# Patient Record
Sex: Male | Born: 1965 | Race: White | Hispanic: No | Marital: Married | Smoking: Former smoker
Health system: Southern US, Community
[De-identification: ages and names within clinical notes are randomized; demographics above are authoritative.]

## PROBLEM LIST (undated history)

## (undated) DIAGNOSIS — R7303 Prediabetes: Secondary | ICD-10-CM

## (undated) DIAGNOSIS — I509 Heart failure, unspecified: Secondary | ICD-10-CM

## (undated) DIAGNOSIS — I2510 Atherosclerotic heart disease of native coronary artery without angina pectoris: Secondary | ICD-10-CM

## (undated) DIAGNOSIS — L929 Granulomatous disorder of the skin and subcutaneous tissue, unspecified: Secondary | ICD-10-CM

## (undated) DIAGNOSIS — R0602 Shortness of breath: Secondary | ICD-10-CM

## (undated) DIAGNOSIS — I219 Acute myocardial infarction, unspecified: Secondary | ICD-10-CM

## (undated) DIAGNOSIS — E669 Obesity, unspecified: Secondary | ICD-10-CM

## (undated) DIAGNOSIS — I255 Ischemic cardiomyopathy: Secondary | ICD-10-CM

## (undated) HISTORY — DX: Granulomatous disorder of the skin and subcutaneous tissue, unspecified: L92.9

## (undated) HISTORY — DX: Prediabetes: R73.03

## (undated) HISTORY — DX: Atherosclerotic heart disease of native coronary artery without angina pectoris: I25.10

## (undated) HISTORY — PX: LUNG BIOPSY: SHX232

## (undated) HISTORY — PX: CHOLECYSTECTOMY: SHX55

## (undated) HISTORY — DX: Obesity, unspecified: E66.9

## (undated) HISTORY — DX: Acute myocardial infarction, unspecified: I21.9

## (undated) HISTORY — DX: Heart failure, unspecified: I50.9

## (undated) HISTORY — DX: Shortness of breath: R06.02

## (undated) HISTORY — DX: Ischemic cardiomyopathy: I25.5

---

## 1998-06-28 ENCOUNTER — Emergency Department (HOSPITAL_COMMUNITY): Admission: EM | Admit: 1998-06-28 | Discharge: 1998-06-28 | Payer: Self-pay | Admitting: Emergency Medicine

## 1998-08-03 ENCOUNTER — Emergency Department (HOSPITAL_COMMUNITY): Admission: EM | Admit: 1998-08-03 | Discharge: 1998-08-03 | Payer: Self-pay | Admitting: Emergency Medicine

## 2004-02-10 ENCOUNTER — Emergency Department (HOSPITAL_COMMUNITY): Admission: EM | Admit: 2004-02-10 | Discharge: 2004-02-10 | Payer: Self-pay | Admitting: Emergency Medicine

## 2007-03-06 ENCOUNTER — Emergency Department (HOSPITAL_COMMUNITY): Admission: EM | Admit: 2007-03-06 | Discharge: 2007-03-06 | Payer: Self-pay | Admitting: Emergency Medicine

## 2007-03-07 ENCOUNTER — Ambulatory Visit: Payer: Self-pay | Admitting: Cardiology

## 2007-03-08 ENCOUNTER — Ambulatory Visit: Payer: Self-pay

## 2007-03-08 LAB — CONVERTED CEMR LAB
ALT: 38 units/L (ref 0–40)
AST: 28 units/L (ref 0–37)
Alkaline Phosphatase: 103 units/L (ref 39–117)
Bilirubin, Direct: 0.1 mg/dL (ref 0.0–0.3)
Calcium: 9.2 mg/dL (ref 8.4–10.5)
Cholesterol: 200 mg/dL (ref 0–200)
Creatinine, Ser: 1.1 mg/dL (ref 0.4–1.5)
HDL: 25.4 mg/dL — ABNORMAL LOW (ref >39.0)
LDL Cholesterol: 145 mg/dL — ABNORMAL HIGH (ref 0–99)
Potassium: 3.9 meq/L (ref 3.5–5.1)
Sodium: 136 meq/L (ref 135–145)
Total CHOL/HDL Ratio: 7.9
VLDL: 30 mg/dL (ref 0–40)

## 2010-02-22 ENCOUNTER — Emergency Department: Payer: Self-pay | Admitting: Emergency Medicine

## 2010-02-24 ENCOUNTER — Ambulatory Visit: Payer: Self-pay | Admitting: Internal Medicine

## 2010-02-24 DIAGNOSIS — H6690 Otitis media, unspecified, unspecified ear: Secondary | ICD-10-CM | POA: Insufficient documentation

## 2010-02-24 DIAGNOSIS — M109 Gout, unspecified: Secondary | ICD-10-CM | POA: Insufficient documentation

## 2010-11-17 NOTE — Assessment & Plan Note (Signed)
Summary: nausea,vomiting/alr ok per Alena Bills   Vital Signs:  Patient profile:   45 year old male Height:      68 inches Weight:      263.4 pounds BMI:     40.19 Temp:     97.3 degrees F BP sitting:   130 / 84  Vitals Entered By: Shary Decamp (Feb 24, 2010 10:17 AM) CC: NEW PT ACUTE ONLY, PT HAS APPT TO EST LATER THIS WEEK Comments  - went to ED few days ago, dx with OM (left)  - on percocet, amoxil, antipyrine benzocaine otic  - patient having n/v since starting meds  - still having ear pain Shary Decamp  Feb 24, 2010 10:30 AM    History of Present Illness: new  patient had a cold for two weeks, sore throat, ear ache symptoms were left sided and intense for few days, went to ER in [facility] he was diagnosed with otitis media, prescribed amoxicillin, oxycodone, eardrops ever since he started taking amoxicillin & oxycodone he started to have  nausea and vomiting, no diarrhea.   Patient is not sure which of the two medicines is causing the nausea and vomiting. Ear pain better  Current Medications (verified): 1)  None  Allergies (verified): 1)  ! Morphine  Past History:  Past Medical History: chronic granulomatosis (dx at age 36 @ [facility]) borderline DM Gout  Past Surgical History: Cholecystectomy  Family History: DM-- F side, GF MI-- F CABG age 73   Social History: married-- yes children x 1 tobacco--no ETOH-- socially   Review of Systems General:  denies fevers no ear discharge sore throat has improved denies sinus congestion he did have cough and chest congestion, these  symptoms are improving.  Physical Exam  General:  alert, well-developed, and overweight-appearing.   Head:  face symmetric, not tender to palpation Eyes:  EOMI Ears:  R ear normal.   left TM red  @ upper anterior corner, no bulge, discharge Nose:   not congested Mouth:  slightly red, no discharge, uvula midline Lungs:  normal respiratory effort, no  intercostal retractions, no accessory muscle use, and normal breath sounds.      Impression & Recommendations:  Problem # 1:  LOM (ICD-382.9) patient presents with otitis media, he reports that in the emergency room a few days ago the left ear was quite swollen, today it  doesn't look that bad.  He must be responded to treatment. He has nausea vomiting after taking pain medication and  amoxicillin; statistically, most likely the nausea  is due to the pain medication Plan:  Continue amoxicillin and eardrops pain management with Tylenol and  toradol  stop oxycodone    His updated medication list for this problem includes:    Ketorolac Tromethamine 10 Mg Tabs (Ketorolac tromethamine) ..... One by mouth every 6 hours as needed for pain, do not use for more than 5 days  Problem # 2:  does not have a PCP for regular checkups, see instructions  Problem # 3:  ? of HYPERGLYCEMIA (ICD-790.29) in the past there was a question of hyperglycemia, CBC today normal Orders: Fingerstick (36416) Glucose, (CBG) (19147)  Complete Medication List: 1)  Ketorolac Tromethamine 10 Mg Tabs (Ketorolac tromethamine) .... One by mouth every 6 hours as needed for pain, do not use for more than 5 days  Patient Instructions: 1)  Continue amoxicillin and eardrops 2)  take  Tylenol  for pain 3)  if the pain is severe, take Toradol  4)  stop oxycodone 5)  if you continue with nausea, let us know 6)  also recommend you  to schedule a general checkup at your convenience Prescriptions: KETOROLAC TROMETHAMINE 10 MG TABS (KETOROLAC TROMETHAMINE) one by mouth every 6 hours as needed for pain, do not use for more than 5 days  #12 x 0   Entered and Authorized by:   Brian Rod. Jassiah Viviano MD   Signed by:   Brian Rod. Fatoumata Albaugh MD on 02/24/2010   Method used:   Print then Give to Patient   RxID:   [id number]    Family History:    DM-- F side, GF    MI-- F CABG age 30   Social History:    married-- yes    children x 1     tobacco--no    ETOH-- socially   Laboratory Results   Blood Tests     Glucose (random): 88 mg/dL   (Normal Range: 14-782)

## 2011-03-02 NOTE — Assessment & Plan Note (Signed)
[facility]                            CARDIOLOGY OFFICE NOTE   Brian Gross, Brian Gross                       MRN:          [id number]  DATE:03/07/2007                            DOB:          [birth year]    REFERRING PHYSICIAN:  Joen Laura, MD   REASON FOR CONSULTATION:  Evaluate patient with chest pain.   HISTORY OF PRESENT ILLNESS:  Patient is a pleasant 45 year old gentleman  without prior cardiac history.  He was in the emergency room last night  with chest discomfort.  This actually began the night before that.  It  awoke him from his sleep.  It was left-sided.  He cannot qualify it to  say sharp or dull.  It felt like a pulled muscle.  It was 5/10 in  intensity.  It radiated slightly to his left shoulder.  He felt a little  bit better when he was lying still.  It may have increased when he took  a deep breath.  He took some BC powders and went back to sleep; however,  he awoke in the morning and felt it coming back on and took more BC  powders.  Throughout the day, he noticed this pain coming back.  He  finally presented to the emergency room.  He had no acute EKG changes  and no elevated cardiac markers.  He was given morphine and developed  nausea.  He said the symptoms were not associated with shortness of  breath.  There is no radiation to his jaw.  There was no diaphoresis.  He was not able to bring these on with activity.   He does report nausea.  He oftentimes has to stop what he is doing when  he is working because he gets nauseated.  He leans over and feels like  he is going to vomit, but he does not.  This has been somewhat of a  chronic problem.   He does have some dyspnea doing moderate activity.  This has been slowly  progressive.  He reports a chronic lung disease with previous severe  pneumonias.  His chest x-ray in the ER did suggest some pleural  thickening.   PAST MEDICAL HISTORY:  1. Chronic granulomatosis (diagnosed  at age 65 at [facility]).  2. Borderline diabetes mellitus.  3. Gout.   PAST SURGICAL HISTORY:  Cholecystectomy.   MEDICATIONS:  BC powder, Prilosec.   SOCIAL HISTORY:  He is a Music therapist.  He is married.  He has one 10-year-  old.  He has never smoked cigarettes.  He currently smokes marijuana.  He does not drink alcohol.   FAMILY HISTORY:  Contributory for his father having coronary disease in  his 61s.  He is alive at age 9 and had bypass at age 26.  His mother  had a myocardial infarction with coronary spasm in her 26s.   REVIEW OF SYSTEMS:  As stated in the HPI and positive for headaches,  duodenal ulcer, gastroesophageal reflux disease (symptoms not similar to  his current symptoms).   PHYSICAL EXAMINATION:  GENERAL:  Patient is in no  distress.  VITAL SIGNS:  Weight 233 pounds (Body Mass Index 35).  Blood pressure  112/84, heart rate 77 and regular.  HEENT:  Eyelids unremarkable.  Pupils are equal, round and reactive to  light.  Fundi not visualized.  Oral mucosa unremarkable.  NECK:  No jugular venous distention at 45 degrees.  Carotid upstroke  brisk and symmetric.  No bruits, no thyromegaly.  LYMPHATICS:  No cervical, axillary, or inguinal adenopathy.  LUNGS:  Clear to auscultation bilaterally.  BACK:  No costovertebral angle tenderness.  CHEST:  Unremarkable.  HEART:  PMI not displaced or sustained.  S1 and S2 within normal limits.  No S3, no S4, no clicks, rubs, or murmurs.  ABDOMEN:  Obese, positive bowel sounds.  Normal in frequency and pitch.  No bruits, rebound, guarding.  There are no midline pulsatile masses.  No hepatomegaly, splenomegaly.  SKIN:  No rashes, no nodules.  EXTREMITIES:  Pulses 2+ throughout.  No cyanosis, no clubbing, no edema.  NEUROLOGIC:  Alert and oriented to person, place, and time.  Cranial  nerves II-XII grossly intact.  Motor grossly intact throughout.   EKG (ER), sinus rhythm, rate 52, axis within normal limits, intervals   within normal limits, no acute ST-T wave changes.   ASSESSMENT/PLAN:  1. The patient's chest discomfort has some atypical features.  There      are some worrisome features as well.  He has a very severe family      history.  Given this, his pre-test probability of obstructive      coronary artery disease is somewhat moderate.  He needs screening      with an exercise Cardiolite.  Further evaluation based on these      results.  2. Dyspnea:  I will check a BNP level as well.  If he has a normal      ejection fraction and low BNP, then I would suggest followup with      pulmonologist.  He has had some pleural thickening, and we      discussed this in the office.  He knows he needs followup with      this.  This probably is chronically related to his previous severe      pneumonia and white blood cell disease; however, again, I told him      he needs followup with a pulmonologist, and they will arrange this.  3. Obesity:  He has lost weight intentionally in the past.  He used to      weigh greater than 300 pounds.  I applauded this and encouraged      more of the same.  4. Risk reduction:  We will check a lipid profile when he comes back      for his stress test.  5. Followup:  We will see him back based on the results of the above.     Rollene Rotunda, MD, [facility]  Electronically Signed    JH/MedQ  DD: 03/07/2007  DT: 03/07/2007  Job #: [id number]

## 2011-04-08 ENCOUNTER — Ambulatory Visit (INDEPENDENT_AMBULATORY_CARE_PROVIDER_SITE_OTHER): Payer: Self-pay | Admitting: Internal Medicine

## 2011-04-08 ENCOUNTER — Telehealth: Payer: Self-pay | Admitting: *Deleted

## 2011-04-08 ENCOUNTER — Encounter: Payer: Self-pay | Admitting: Internal Medicine

## 2011-04-08 ENCOUNTER — Ambulatory Visit (INDEPENDENT_AMBULATORY_CARE_PROVIDER_SITE_OTHER)
Admission: RE | Admit: 2011-04-08 | Discharge: 2011-04-08 | Disposition: A | Discharge status: Home / Self Care | Payer: PRIVATE HEALTH INSURANCE | Source: Ambulatory Visit | Attending: Internal Medicine | Admitting: Internal Medicine

## 2011-04-08 VITALS — BP 120/82 | HR 83 | Temp 97.9°F | Wt 285.0 lb

## 2011-04-08 DIAGNOSIS — J40 Bronchitis, not specified as acute or chronic: Secondary | ICD-10-CM

## 2011-04-08 DIAGNOSIS — R062 Wheezing: Secondary | ICD-10-CM

## 2011-04-08 MED ORDER — AZITHROMYCIN 250 MG PO TABS: ORAL_TABLET | ORAL | Status: AC

## 2011-04-08 MED ORDER — ALBUTEROL SULFATE HFA 108 (90 BASE) MCG/ACT IN AERS: 2.0000 | INHALATION_SPRAY | Freq: Four times a day (QID) | RESPIRATORY_TRACT | Status: DC | PRN

## 2011-04-08 NOTE — Telephone Encounter (Signed)
Message copied by Leanne Lovely on Thu Apr 08, 2011  3:13 PM ------      Message from: Brian Gross      Created: Thu Apr 08, 2011  3:03 PM       Advise patient:      No pneumonia, plan is the same

## 2011-04-08 NOTE — Progress Notes (Signed)
  Subjective:    Patient ID: Brian Gross, male    DOB: 06/02/1966, 45 y.o.   MRN: [id number]  HPI Symptoms started to 5 days ago, "chest cold" with cough, wheezing, shortness or breath. Has not taken any medications. Report as a history of granulomatosis and previous pneumonias years ago. This problem has been silent since his 62s.  Past Medical History  Diagnosis Date  . Granulomatosis     chronic granulomatosis (dx at age 46 @ [facility])  . Gout   . Borderline diabetes   . Asthma     Past Surgical History  Procedure Date  . Cholecystectomy     Social History: married-- yes children x 1 tobacco--no ETOH-- socially   Review of Systems  Constitutional: Negative for fever.  Respiratory:       Some yellow sputum in AM, rest of the day clear sputum. + wheezing  Gastrointestinal: Negative for nausea and vomiting.       Objective:   Physical Exam  Constitutional: He appears well-developed and well-nourished. No distress.       Frequent cough noted but no respiratory distress  HENT:  Head: Normocephalic.  Mouth/Throat: No oropharyngeal exudate.  Eyes: No scleral icterus.  Cardiovascular: Normal rate, regular rhythm and normal heart sounds.   No murmur heard. Pulmonary/Chest:       Few rhonchi and wheezing bilaterally. No increased work of breathing. No crackles.  Skin: He is not diaphoretic.          Assessment & Plan:

## 2011-04-08 NOTE — Patient Instructions (Addendum)
Get your XR Rest, fluids , tylenol For cough, take Mucinex DM twice a day as needed  For wheezing: ventolin 2 puffs every 6 hours  Take the antibiotic as prescribed (zpack) Call if no better in few days Call anytime if the symptoms are severe, you have high fever, short of breath, persistent chest pain  Also came back for a physical at your convenience

## 2011-04-08 NOTE — Telephone Encounter (Signed)
Message left for patient to return my call.  

## 2011-04-08 NOTE — Assessment & Plan Note (Addendum)
Sx c/w bronchitis and asthma exacerbation , also has some R sided pleuritic pain: Will get a XR, see instructions Also needs a CPX , pt aware

## 2011-04-09 NOTE — Telephone Encounter (Signed)
Pt aware of information.  

## 2011-10-18 ENCOUNTER — Other Ambulatory Visit: Payer: Self-pay

## 2011-10-18 ENCOUNTER — Inpatient Hospital Stay (HOSPITAL_COMMUNITY)
Admission: AD | Admit: 2011-10-18 | Discharge: 2011-10-21 | DRG: 247 | Disposition: A | Discharge status: Home / Self Care | Payer: PRIVATE HEALTH INSURANCE | Source: Ambulatory Visit | Attending: Interventional Cardiology | Admitting: Interventional Cardiology

## 2011-10-18 ENCOUNTER — Encounter (HOSPITAL_COMMUNITY)
Admission: AD | Disposition: A | Discharge status: Home / Self Care | Payer: Self-pay | Source: Ambulatory Visit | Attending: Interventional Cardiology

## 2011-10-18 ENCOUNTER — Encounter (HOSPITAL_COMMUNITY): Payer: Self-pay | Admitting: *Deleted

## 2011-10-18 DIAGNOSIS — Z79899 Other long term (current) drug therapy: Secondary | ICD-10-CM

## 2011-10-18 DIAGNOSIS — E785 Hyperlipidemia, unspecified: Secondary | ICD-10-CM | POA: Diagnosis present

## 2011-10-18 DIAGNOSIS — F17200 Nicotine dependence, unspecified, uncomplicated: Secondary | ICD-10-CM | POA: Diagnosis present

## 2011-10-18 DIAGNOSIS — M109 Gout, unspecified: Secondary | ICD-10-CM | POA: Diagnosis present

## 2011-10-18 DIAGNOSIS — E8779 Other fluid overload: Secondary | ICD-10-CM | POA: Diagnosis present

## 2011-10-18 DIAGNOSIS — E669 Obesity, unspecified: Secondary | ICD-10-CM

## 2011-10-18 DIAGNOSIS — I2109 ST elevation (STEMI) myocardial infarction involving other coronary artery of anterior wall: Principal | ICD-10-CM

## 2011-10-18 DIAGNOSIS — J45909 Unspecified asthma, uncomplicated: Secondary | ICD-10-CM | POA: Diagnosis present

## 2011-10-18 DIAGNOSIS — Z7982 Long term (current) use of aspirin: Secondary | ICD-10-CM

## 2011-10-18 DIAGNOSIS — I2510 Atherosclerotic heart disease of native coronary artery without angina pectoris: Secondary | ICD-10-CM

## 2011-10-18 DIAGNOSIS — I2582 Chronic total occlusion of coronary artery: Secondary | ICD-10-CM | POA: Diagnosis present

## 2011-10-18 HISTORY — PX: LEFT HEART CATHETERIZATION WITH CORONARY ANGIOGRAM: SHX5451

## 2011-10-18 HISTORY — PX: PERCUTANEOUS CORONARY STENT INTERVENTION (PCI-S): SHX5485

## 2011-10-18 LAB — COMPREHENSIVE METABOLIC PANEL
ALT: 32 U/L (ref 0–53)
AST: 80 U/L — ABNORMAL HIGH (ref 0–37)
Albumin: 3.3 g/dL — ABNORMAL LOW (ref 3.5–5.2)
Alkaline Phosphatase: 102 U/L (ref 39–117)
BUN: 22 mg/dL (ref 6–23)
CO2: 23 mEq/L (ref 19–32)
GFR calc non Af Amer: 87 mL/min — ABNORMAL LOW (ref >90)
Glucose, Bld: 131 mg/dL — ABNORMAL HIGH (ref 70–99)
Sodium: 134 mEq/L — ABNORMAL LOW (ref 135–145)
Total Bilirubin: 0.3 mg/dL (ref 0.3–1.2)

## 2011-10-18 LAB — CARDIAC PANEL(CRET KIN+CKTOT+MB+TROPI)
CK, MB: 98.6 ng/mL (ref 0.3–4.0)
CK, MB: 296.7 ng/mL (ref 0.3–4.0)
Relative Index: 5.7 — ABNORMAL HIGH (ref 0.0–2.5)
Total CK: 1392 U/L — ABNORMAL HIGH (ref 7–232)
Total CK: 4931 U/L — ABNORMAL HIGH (ref 7–232)
Troponin I: >25 ng/mL (ref <0.30)

## 2011-10-18 LAB — CBC
HCT: 43.1 % (ref 39.0–52.0)
Hemoglobin: 14.9 g/dL (ref 13.0–17.0)
MCH: 29.9 pg (ref 26.0–34.0)
RDW: 13.1 % (ref 11.5–15.5)
WBC: 13.2 10*3/uL — ABNORMAL HIGH (ref 4.0–10.5)

## 2011-10-18 LAB — MRSA PCR SCREENING: MRSA by PCR: NEGATIVE

## 2011-10-18 LAB — LIPID PANEL
Cholesterol: 222 mg/dL — ABNORMAL HIGH (ref 0–200)
LDL Cholesterol: 143 mg/dL — ABNORMAL HIGH (ref 0–99)
Triglycerides: 156 mg/dL — ABNORMAL HIGH (ref <150)

## 2011-10-18 LAB — GLUCOSE, CAPILLARY: Glucose-Capillary: 124 mg/dL — ABNORMAL HIGH (ref 70–99)

## 2011-10-18 LAB — POCT I-STAT, CHEM 8
Chloride: 104 mEq/L (ref 96–112)
Glucose, Bld: 136 mg/dL — ABNORMAL HIGH (ref 70–99)
HCT: 47 % (ref 39.0–52.0)
Potassium: 4.3 mEq/L (ref 3.5–5.1)

## 2011-10-18 LAB — PROTIME-INR
INR: 1.05 (ref 0.00–1.49)
Prothrombin Time: 13.9 seconds (ref 11.6–15.2)

## 2011-10-18 LAB — APTT: aPTT: 47 seconds — ABNORMAL HIGH (ref 24–37)

## 2011-10-18 LAB — POCT ACTIVATED CLOTTING TIME: Activated Clotting Time: 452 seconds

## 2011-10-18 SURGERY — LEFT HEART CATHETERIZATION WITH CORONARY ANGIOGRAM
Anesthesia: LOCAL

## 2011-10-18 MED ORDER — TICAGRELOR 90 MG PO TABS
ORAL_TABLET | ORAL | Status: AC
  Filled 2011-10-18: qty 2

## 2011-10-18 MED ORDER — SODIUM CHLORIDE 0.9 % IV SOLN: 1.0000 mL/kg/h | INTRAVENOUS | Status: AC

## 2011-10-18 MED ORDER — FENTANYL CITRATE 0.05 MG/ML IJ SOLN
INTRAMUSCULAR | Status: AC
  Filled 2011-10-18: qty 2

## 2011-10-18 MED ORDER — BIVALIRUDIN 250 MG IV SOLR
INTRAVENOUS | Status: AC
  Filled 2011-10-18: qty 250

## 2011-10-18 MED ORDER — ROSUVASTATIN CALCIUM 40 MG PO TABS
40.0000 mg | ORAL_TABLET | Freq: Every day | ORAL | Status: DC
  Administered 2011-10-18 – 2011-10-20 (×3): 40 mg via ORAL
  Filled 2011-10-18 (×4): qty 1

## 2011-10-18 MED ORDER — MIDAZOLAM HCL 2 MG/2ML IJ SOLN
INTRAMUSCULAR | Status: AC
  Filled 2011-10-18: qty 2

## 2011-10-18 MED ORDER — BIOTENE DRY MOUTH MT LIQD
15.0000 mL | Freq: Two times a day (BID) | OROMUCOSAL | Status: DC
  Administered 2011-10-18 – 2011-10-19 (×2): 15 mL via OROMUCOSAL

## 2011-10-18 MED ORDER — ONDANSETRON HCL 4 MG/2ML IJ SOLN: 4.0000 mg | Freq: Four times a day (QID) | INTRAMUSCULAR | Status: DC | PRN

## 2011-10-18 MED ORDER — NITROGLYCERIN 0.2 MG/ML ON CALL CATH LAB
INTRAVENOUS | Status: AC
  Filled 2011-10-18: qty 1

## 2011-10-18 MED ORDER — LIDOCAINE HCL (PF) 1 % IJ SOLN
INTRAMUSCULAR | Status: AC
  Filled 2011-10-18: qty 30

## 2011-10-18 MED ORDER — LORAZEPAM 2 MG/ML IJ SOLN
1.0000 mg | INTRAMUSCULAR | Status: DC | PRN
  Administered 2011-10-18: 2 mg via INTRAVENOUS
  Administered 2011-10-18: 1 mg via INTRAVENOUS
  Administered 2011-10-19 – 2011-10-20 (×3): 2 mg via INTRAVENOUS
  Filled 2011-10-18 (×5): qty 1

## 2011-10-18 MED ORDER — SODIUM CHLORIDE 0.9 % IV SOLN
INTRAVENOUS | Status: DC
  Administered 2011-10-18: 19:00:00 via INTRAVENOUS
  Administered 2011-10-19: 10 mL/h via INTRAVENOUS

## 2011-10-18 MED ORDER — FENTANYL CITRATE 0.05 MG/ML IJ SOLN
25.0000 ug | INTRAMUSCULAR | Status: DC | PRN
  Administered 2011-10-18 (×3): 50 ug via INTRAVENOUS
  Filled 2011-10-18 (×2): qty 2

## 2011-10-18 MED ORDER — ACETAMINOPHEN 325 MG PO TABS
650.0000 mg | ORAL_TABLET | ORAL | Status: DC | PRN
  Administered 2011-10-19 – 2011-10-21 (×3): 650 mg via ORAL
  Filled 2011-10-18 (×3): qty 2

## 2011-10-18 MED ORDER — TICAGRELOR 90 MG PO TABS
90.0000 mg | ORAL_TABLET | Freq: Two times a day (BID) | ORAL | Status: DC
  Administered 2011-10-18 – 2011-10-21 (×6): 90 mg via ORAL
  Filled 2011-10-18 (×8): qty 1

## 2011-10-18 MED ORDER — FENTANYL CITRATE 0.05 MG/ML IJ SOLN
INTRAMUSCULAR | Status: AC
  Administered 2011-10-18: 50 ug via INTRAVENOUS
  Filled 2011-10-18: qty 2

## 2011-10-18 MED ORDER — HEPARIN (PORCINE) IN NACL 2-0.9 UNIT/ML-% IJ SOLN
INTRAMUSCULAR | Status: AC
  Filled 2011-10-18: qty 1000

## 2011-10-18 MED ORDER — METOPROLOL TARTRATE 12.5 MG HALF TABLET
12.5000 mg | ORAL_TABLET | Freq: Two times a day (BID) | ORAL | Status: DC
  Administered 2011-10-18: 12.5 mg via ORAL
  Filled 2011-10-18 (×2): qty 1

## 2011-10-18 MED ORDER — METOPROLOL TARTRATE 25 MG PO TABS
25.0000 mg | ORAL_TABLET | Freq: Two times a day (BID) | ORAL | Status: DC
  Administered 2011-10-18 – 2011-10-20 (×4): 25 mg via ORAL
  Filled 2011-10-18 (×5): qty 1

## 2011-10-18 MED ORDER — ASPIRIN 81 MG PO CHEW
81.0000 mg | CHEWABLE_TABLET | Freq: Every day | ORAL | Status: DC
  Administered 2011-10-19 – 2011-10-21 (×3): 81 mg via ORAL
  Filled 2011-10-18 (×3): qty 1

## 2011-10-18 NOTE — Progress Notes (Signed)
Chaplain Note:  Chaplain responded immediately to Code STEMI page.  Chaplain met pt's family in ED waiting area, introduced Brian Gross, and guided them to a consult room outside the Cath Lab.  Pt's wife, son, and father were present.  Chaplain offered spiritual comfort and support for pt's family.  Chaplain support was appreciated but no specific needs were expressed.  Chaplain informed Cath Lab staff of family's location.  Chaplain will refer case to chaplain assigned to CICU.    Verdie Shire, chaplain resident [phone]

## 2011-10-18 NOTE — Procedures (Signed)
PROCEDURE:  Left heart catheterization with selective coronary angiography, left ventriculogram.  PCI LAD.  Attempted PCI RCA.  INDICATIONS:    The patient was brought emergently to the cath lab.  The patient verbalized understanding and wanted to proceed.  Emergency, implied consent.  PROCEDURE TECHNIQUE:  After Xylocaine anesthesia a 9F sheath was placed in the right femoral vein with a single anterior needle wall stick.   Due to the depth of the vessel, there were some difficulty in getting a 6 French sheath into the right common femoral artery.  We used fluoroscopy and the landmark of the venous sheath to obtain arterial access. Left coronary angiography was done using a Judkins L4 guide catheter.  Right coronary angiography was done using a Judkins R4 guide catheter.  The PCI was performed using a CLS 3.5 guiding catheter.  Angiomax used for anticoagulation. a JR 4 guiding catheter was placed into the ostium of the right coronary artery.  A pro-water wire was advanced to the stenosis but would not cross.  Left ventriculography was done using a pigtail catheter.  An Angio-Seal was deployed for hemostasis.  The venous sheath was sutured in place.   CONTRAST:  Total of 180 cc.  COMPLICATIONS:  None.    HEMODYNAMICS:  Aortic pressure was 107/85; LV pressure was 105/17; LVEDP 30.  There was no gradient between the left ventricle and aorta.    ANGIOGRAPHIC DATA:   The left main coronary artery is large, widely patent vessel..  The left anterior descending artery is is a large vessel.  The proximal vessel is widely patent.  Just past the first septal perforator there is a complete occlusion.  After intervention, we're able to see that the LAD has other mild luminal regularities but is overall widely patent.  It does wrap around the apex..  The left circumflex artery is a medium-sized vessel.  There is mild atherosclerosis in the mid vessel.  There is a medium-sized OM 1 which also has mild luminal  irregularities.  The OM1 branches across the lateral wall.  There are collaterals from the left circumflex filling the distal right vessel..  The right coronary artery is a medium size dominant vessel.  There is a high bifurcation of the PLA and PDA.  The posterior lateral artery appears occluded in the midportion.  PCI Narrative:  A CLS 3.5 guiding catheters used a pro-water wire was placed across the lesion in the LAD aspiration catheter was used which successfully removed a large burden of thrombus.  A 2.5 x 12 immerge balloon was used to dilate the residual 80% stenosis in the mid LAD.  The area was stented with a 3.0 x 20 promus drug-eluting stent deployed at 18 atmospheres for 30 seconds.  The stent was post on a 3.5 x 15 NCTrak: Inflated up to 18 atmospheres.  There is no residual stenosis.  TIMI-3 flow was restored to the LAD.  Intracoronary nitroglycerin was used successfully to treat vasospasm during the case. The JR 4 guiding catheter was used to his right coronary artery.  We elected to try to intervene upon this vessel because of a hazy appearance of the occlusion.  It was not clear whether this was recent or old.  The pro-water wire was easily advanced to the stenosis but did not cross.  Therefore we stopped as it seemed more likely that this is a chronic lesion.  LEFT VENTRICULOGRAM:  Left ventricular angiogram was done in the 30 RAO projection and revealed severe anterior and  apical hypokinesis with an estimated ejection fraction of 30 %.  LVEDP was 30 mmHg.  IMPRESSIONS:  1. Normal left main coronary artery.  Mild disease in the circumflex system. 2. Occluded LAD which was successfully stented with a drug-eluting stent to the mid vessel.  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 3. 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 Severe left ventricular systolic dysfunction.  LVEDP 30 mmHg.  Ejection  fraction 30 %.  RECOMMENDATION:  The patient will be watched in the ICU.  He'll need aggressive risk factor modification.  He'll need dual antiplatelet therapy for at least a year.  We'll start beta blocker and statin.  He will benefit from weight loss.

## 2011-10-18 NOTE — H&P (Signed)
Admit date: 10/18/2011 Referring Physician Dr. Drue Novel Primary Cardiologist Eldridge Dace Chief complaint/reason for admission: Chest pain  HPI: 45 year old man who has no prior cardiac history.  He woke up this morning about 1 AM with a pressure in his chest.  The pressure continued and he eventually called EMS later in the morning.  Initial ECG, which was transmitted by [facility], showed Q waves throughout the anterior precordium with some minimal ST elevation.  This is suggestive of an old anterior infarct.  I went to meet him in the emergency room and when he arrived, I saw that he was holding his chest.  He continued to report chest pressure.  He did not feel well in general.  There is no sweating, nausea or lightheadedness.  Further history shows that he has borderline diabetes.  He does smoke marijuana occasionally.  He does not report smoking cigarettes.  He does not report any family history of heart disease.  He does not take any medicines at home.  He did take several BC powders prior to arrival for her pain.  He did not report any bleeding problems.  He does not have any elective surgery planned.    PMH:    Past Medical History  Diagnosis Date  . Granulomatosis     chronic granulomatosis (dx at age 89 @ [facility])  . Gout   . Borderline diabetes   . Asthma     PSH:    Past Surgical History  Procedure Date  . Cholecystectomy     ALLERGIES:   Morphine  Prior to Admit Meds:   Prescriptions prior to admission  Medication Sig Dispense Refill  . albuterol (PROVENTIL HFA;VENTOLIN HFA) 108 (90 BASE) MCG/ACT inhaler Inhale 2 puffs into the lungs every 6 (six) hours as needed. For wheezing       . DISCONTD: albuterol (VENTOLIN HFA) 108 (90 BASE) MCG/ACT inhaler Inhale 2 puffs into the lungs every 6 (six) hours as needed for wheezing.  1 Inhaler  1   Family HX:   No family history on file. Social HX:    History   Social History  . Marital Status: Single    Spouse Name: N/A   Number of Children: N/A  . Years of Education: N/A   Occupational History  . Not on file.   Social History Main Topics  . Smoking status: Not on file  . Smokeless tobacco: Not on file  . Alcohol Use: Not on file  . Drug Use: Not on file  . Sexually Active: Not on file   Other Topics Concern  . Not on file   Social History Narrative  . No narrative on file     ROS:  All 11 ROS were addressed and are negative except what is stated in the HPI  PHYSICAL EXAM Filed Vitals:   10/18/11 1025  Pulse: 96   General: Well developed, well nourished, anxious, tearful Head: Eyes PERRLA, No xanthomas.   Normal cephalic and atramatic  Lungs:   Clear bilaterally to auscultation and percussion. Heart:   HRRR S1 S2  Abdomen: Obese Msk:  Back normal, . Normal strength and tone for age. Extremities:   No clubbing, cyanosis or edema.  Difficult to palpate femoral pulse Neuro: Alert and oriented X 3. Psych:  Anxious, tearful   Labs:   Lab Results  Component Value Date   WBC 13.2* 10/18/2011   HGB 14.9 10/18/2011   HCT 43.1 10/18/2011   MCV 86.4 10/18/2011   PLT 238  10/18/2011    Lab 10/18/11 1000  NA 134*  K 4.0  CL 99  CO2 23  BUN 22  CREATININE 1.03  CALCIUM 8.6  PROT 7.4  BILITOT 0.3  ALKPHOS 102  ALT 32  AST 80*  GLUCOSE 131*   Lab Results  Component Value Date   CKTOTAL 1392* 10/18/2011   CKMB 98.6* 10/18/2011   TROPONINI 15.58* 10/18/2011   No results found for this basename: PTT   Lab Results  Component Value Date   INR 1.05 10/18/2011     Lab Results  Component Value Date   CHOL 222* 10/18/2011   CHOL 200 03/08/2007   Lab Results  Component Value Date   HDL 48 10/18/2011   HDL 25.4* 03/08/2007   Lab Results  Component Value Date   LDLCALC 143* 10/18/2011   LDLCALC 145* 03/08/2007   Lab Results  Component Value Date   TRIG 156* 10/18/2011   TRIG 150* 03/08/2007   Lab Results  Component Value Date   CHOLHDL 4.6 10/18/2011   CHOLHDL 7.9 CALC  03/08/2007   No results found for this basename: LDLDIRECT      Radiology:  @RISRSLT24 @  EKG: Normal sinus rhythm, anterior Q waves with minimal ST elevation.  ASSESSMENT: Possible acute anterior ST elevation MI  PLAN:  Although the ECG is not convincing for STEMI, the patient appears to be in continued discomfort.  He certainly has some risk factors for heart disease and I think it would be safer to perform an emergent coronary angiogram on him.  Although he is quite nervous, he is agreeable.  Regardless of the findings, he'll need risk factor modification including stopping smoking, weight loss, blood sugar management.  Further plans will be based on the findings from his catheterization.  Will also check lipids.  He will be admitted to the hospital.  Corky Crafts., MD  10/18/2011  12:53 PM

## 2011-10-18 NOTE — Progress Notes (Signed)
56fr venous sheath aspirated and removed from right femoral vein. Manual pressure applied for 15 minutes. Vss stable BP 101/72 hr 84. No S+S of hematoma. Dressing applied. Distal pulse bilateral dp palpable. Bedrest instructions given.

## 2011-10-19 LAB — BASIC METABOLIC PANEL
BUN: 16 mg/dL (ref 6–23)
Chloride: 99 mEq/L (ref 96–112)
GFR calc Af Amer: >90 mL/min (ref >90)
Potassium: 4.5 mEq/L (ref 3.5–5.1)

## 2011-10-19 LAB — CBC
HCT: 42 % (ref 39.0–52.0)
Hemoglobin: 14 g/dL (ref 13.0–17.0)
RDW: 13.5 % (ref 11.5–15.5)
WBC: 12.7 10*3/uL — ABNORMAL HIGH (ref 4.0–10.5)

## 2011-10-19 LAB — CARDIAC PANEL(CRET KIN+CKTOT+MB+TROPI)
CK, MB: 72.5 ng/mL (ref 0.3–4.0)
Relative Index: 4.5 — ABNORMAL HIGH (ref 0.0–2.5)
Troponin I: >25 ng/mL (ref <0.30)

## 2011-10-19 MED ORDER — FUROSEMIDE 10 MG/ML IJ SOLN
40.0000 mg | Freq: Two times a day (BID) | INTRAMUSCULAR | Status: DC
  Administered 2011-10-19 (×2): 40 mg via INTRAVENOUS
  Filled 2011-10-19 (×4): qty 4

## 2011-10-19 NOTE — Progress Notes (Addendum)
SUBJECTIVE:  Chest pain resolved  OBJECTIVE:   Vitals:   Filed Vitals:   10/19/11 0600 10/19/11 0742 10/19/11 0800 10/19/11 0805  BP:  118/83  118/81  Pulse: 101 104 104 105  Temp:   97.3 F (36.3 C)   TempSrc:   Oral   Resp: 11 13 17 14   Height:      Weight:      SpO2: 98% 97% 97% 97%   I&O's:   Intake/Output Summary (Last 24 hours) at 10/19/11 0926 Last data filed at 10/19/11 0800  Gross per 24 hour  Intake 889.74 ml  Output   1650 ml  Net -760.26 ml   TELEMETRY: Reviewed telemetry pt in NSR, sinus tachycardia:     PHYSICAL EXAM General: Well developed, well nourished, in no acute distress Head: Eyes PERRLA,   Normal cephalic and atramatic  Lungs:  Clear bilaterally to auscultation and percussion. Heart:   HRRR S1 S2 Abdomen: , abdomen soft and non-tender  Msk:  Back normal, normal gait. Normal strength and tone for age. Extremities:  No hematoma in right groin Neuro: Alert and oriented X 3. Psych:  Good affect, responds appropriately   LABS: Basic Metabolic Panel:  Basename 10/19/11 0520 10/18/11 1000  NA 135 134*  K 4.5 4.0  CL 99 99  CO2 25 23  GLUCOSE 92 131*  BUN 16 22  CREATININE 1.11 1.03  CALCIUM 8.8 8.6  MG -- --  PHOS -- --   Liver Function Tests:  Basename 10/18/11 1000  AST 80*  ALT 32  ALKPHOS 102  BILITOT 0.3  PROT 7.4  ALBUMIN 3.3*   No results found for this basename: LIPASE:2,AMYLASE:2 in the last 72 hours CBC:  Basename 10/19/11 0520 10/18/11 1000  WBC 12.7* 13.2*  NEUTROABS -- --  HGB 14.0 14.9  HCT 42.0 43.1  MCV 89.2 86.4  PLT 194 238   Cardiac Enzymes:  Basename 10/19/11 0520 10/18/11 1959 10/18/11 1203  CKTOTAL 1614* 3451* 4931*  CKMB 72.5* 195.9* 296.7*  CKMBINDEX -- -- --  TROPONINI >25.00* >25.00* >25.00*   BNP: No components found with this basename: POCBNP:3 D-Dimer: No results found for this basename: DDIMER:2 in the last 72 hours Hemoglobin A1C:  Basename 10/18/11 1000  HGBA1C 5.5   Fasting  Lipid Panel:  Basename 10/18/11 1000  CHOL 222*  HDL 48  LDLCALC 143*  TRIG 156*  CHOLHDL 4.6  LDLDIRECT --   Thyroid Function Tests: No results found for this basename: TSH,T4TOTAL,FREET3,T3FREE,THYROIDAB in the last 72 hours Anemia Panel: No results found for this basename: VITAMINB12,FOLATE,FERRITIN,TIBC,IRON,RETICCTPCT in the last 72 hours Coag Panel:   Lab Results  Component Value Date   INR 1.05 10/18/2011    RADIOLOGY: No results found.    ASSESSMENT: Status post large anterior wall MI  PLAN:  #1 continue dual antiplatelet therapy.  He has significant LV dysfunction.  We'll give Lasix for diuresis.  This may help with shortness of breath.  He'll also need therapy for heart failure.  Blood pressure has been low at times.  We'll hold off on ACE inhibitor for now.  Continue titrating the beta blocker dose to get his heart rate down closer to 60.  Continue lipid-lowering therapy.  He'll need to stop smoking.  She also needs to lose weight.  Would watch him in the ICU today.  Consider moving out to the floor tomorrow. ECG shows anterior Q waves with persistent ST elevation.  Unlikely that his LV function will come back to  normal, especially given the amount of time he waited before coming in to the hospital.  Residual CAD. Plan for PCI of RCA after a few weeks.  Mildly increased AST.  COntinue statin.  Will follow.    Corky Crafts., MD  10/19/2011  9:26 AM

## 2011-10-20 LAB — CBC
HCT: 44.7 % (ref 39.0–52.0)
Hemoglobin: 14.8 g/dL (ref 13.0–17.0)
RBC: 4.98 MIL/uL (ref 4.22–5.81)
WBC: 14.1 10*3/uL — ABNORMAL HIGH (ref 4.0–10.5)

## 2011-10-20 LAB — BASIC METABOLIC PANEL
BUN: 22 mg/dL (ref 6–23)
Chloride: 94 mEq/L — ABNORMAL LOW (ref 96–112)
GFR calc Af Amer: 70 mL/min — ABNORMAL LOW (ref >90)
Potassium: 3.3 mEq/L — ABNORMAL LOW (ref 3.5–5.1)
Sodium: 135 mEq/L (ref 135–145)

## 2011-10-20 MED ORDER — CARVEDILOL 6.25 MG PO TABS
6.2500 mg | ORAL_TABLET | Freq: Two times a day (BID) | ORAL | Status: DC
  Administered 2011-10-20: 6.25 mg via ORAL
  Filled 2011-10-20 (×4): qty 1

## 2011-10-20 MED ORDER — FUROSEMIDE 40 MG PO TABS
40.0000 mg | ORAL_TABLET | Freq: Every day | ORAL | Status: DC
  Administered 2011-10-20 – 2011-10-21 (×2): 40 mg via ORAL
  Filled 2011-10-20 (×2): qty 1

## 2011-10-20 MED ORDER — LISINOPRIL 2.5 MG PO TABS
2.5000 mg | ORAL_TABLET | Freq: Every day | ORAL | Status: DC
  Administered 2011-10-20 – 2011-10-21 (×2): 2.5 mg via ORAL
  Filled 2011-10-20 (×2): qty 1

## 2011-10-20 MED ORDER — POTASSIUM CITRATE ER 10 MEQ (1080 MG) PO TBCR
40.0000 meq | EXTENDED_RELEASE_TABLET | Freq: Once | ORAL | Status: AC
  Administered 2011-10-20: 40 meq via ORAL
  Filled 2011-10-20: qty 4

## 2011-10-20 MED ORDER — ALBUTEROL SULFATE HFA 108 (90 BASE) MCG/ACT IN AERS
2.0000 | INHALATION_SPRAY | Freq: Four times a day (QID) | RESPIRATORY_TRACT | Status: DC | PRN
Start: 2011-10-20 — End: 2011-10-21
  Filled 2011-10-20: qty 6.7

## 2011-10-20 MED FILL — Dextrose Inj 5%: INTRAVENOUS | Qty: 50 | Status: AC

## 2011-10-20 NOTE — Progress Notes (Signed)
SUBJECTIVE:  Did not sleep well.  Better with oxygen.  Has apneic episodes with intermittent snoring.  Diuresed a lot yesterday.    OBJECTIVE:   Vitals:   Filed Vitals:   10/20/11 0340 10/20/11 0400 10/20/11 0500 10/20/11 0700  BP: 130/77  132/66 119/44  Pulse:      Temp:  98.4 F (36.9 C)  98.2 F (36.8 C)  TempSrc:  Oral  Oral  Resp: 16  18 15   Height:      Weight:   131.5 kg (289 lb 14.5 oz)   SpO2: 97%  95% 98%   I&O's:    Intake/Output Summary (Last 24 hours) at 10/20/11 0850 Last data filed at 10/20/11 0100  Gross per 24 hour  Intake   1318 ml  Output   4100 ml  Net  -2782 ml   TELEMETRY: Reviewed telemetry pt in NSR, sinus tachycardia:     PHYSICAL EXAM General: Well developed, well nourished, in no acute distress Head: Eyes PERRLA,   Normal cephalic and atramatic  Lungs:  Clear bilaterally to auscultation and percussion. Heart:   HRRR S1 S2 Abdomen: , abdomen soft and non-tender  Msk:  Back normal, normal gait. Normal strength and tone for age. Extremities:  No hematoma in right groin, small bruise Neuro: Alert and oriented X 3. Psych:  Good affect, responds appropriately   LABS: Basic Metabolic Panel:  Basename 10/20/11 0515 10/19/11 0520  NA 135 135  K 3.3* 4.5  CL 94* 99  CO2 30 25  GLUCOSE 91 92  BUN 22 16  CREATININE 1.40* 1.11  CALCIUM 9.3 8.8  MG -- --  PHOS -- --   Liver Function Tests:  Basename 10/18/11 1000  AST 80*  ALT 32  ALKPHOS 102  BILITOT 0.3  PROT 7.4  ALBUMIN 3.3*   No results found for this basename: LIPASE:2,AMYLASE:2 in the last 72 hours CBC:  Basename 10/20/11 0515 10/19/11 0520  WBC 14.1* 12.7*  NEUTROABS -- --  HGB 14.8 14.0  HCT 44.7 42.0  MCV 89.8 89.2  PLT 217 194   Cardiac Enzymes:  Basename 10/19/11 0520 10/18/11 1959 10/18/11 1203  CKTOTAL 1614* 3451* 4931*  CKMB 72.5* 195.9* 296.7*  CKMBINDEX -- -- --  TROPONINI >25.00* >25.00* >25.00*   BNP: No components found with this basename:  POCBNP:3 D-Dimer: No results found for this basename: DDIMER:2 in the last 72 hours Hemoglobin A1C:  Basename 10/18/11 1000  HGBA1C 5.5   Fasting Lipid Panel:  Basename 10/18/11 1000  CHOL 222*  HDL 48  LDLCALC 143*  TRIG 156*  CHOLHDL 4.6  LDLDIRECT --   Thyroid Function Tests: No results found for this basename: TSH,T4TOTAL,FREET3,T3FREE,THYROIDAB in the last 72 hours Anemia Panel: No results found for this basename: VITAMINB12,FOLATE,FERRITIN,TIBC,IRON,RETICCTPCT in the last 72 hours Coag Panel:   Lab Results  Component Value Date   INR 1.05 10/18/2011    RADIOLOGY: No results found.    ASSESSMENT: Status post large anterior wall MI  PLAN:  #1 continue dual antiplatelet therapy.  He has significant LV dysfunction.  Oral Lasix for diuresis.  This may help with shortness of breath.  He'll also need therapy for heart failure.  Blood pressure has been low at times.  Start ACE inhibitor  now.  Continue titrating the beta blocker dose to get his heart rate down closer to 60.  Continue lipid-lowering therapy.  He'll need to stop smoking.  He also needs to lose weight.  Would watch him in the  ICU today.  Consider moving out to the floor later today if he does well with cardiac rehab ECG shows anterior Q waves with persistent ST elevation.  Unlikely that his LV function will come back to normal, especially given the amount of time he waited before coming in to the hospital.  Needs aggressive lifestyle changes.  Spoke about weight loss and sleep study.  Residual CAD. Plan for PCI of RCA after a few weeks.  Mildly increased AST.  COntinue statin.  Will follow.    Corky Crafts., MD  10/20/2011  8:50 AM

## 2011-10-20 NOTE — Progress Notes (Signed)
CARDIAC REHAB PHASE I   PRE:  Rate/Rhythm: 108 ST  BP:  Supine: 123/80  Sitting:   Standing:    SaO2: 97 2L 97 RA  MODE:  Ambulation: 700 ft   POST:  Rate/Rhythem: 120 ST  BP:  Supine:   Sitting: 121/82  Standing:    SaO2: 100 RA 0840-0940  Pt  tolerated ambulation well without c/o of cp or SOB. Gait  Steady. HR after walk 120 PPM.Stated MI education with pt and wife. He voices understanding.  Beatrix Fetters

## 2011-10-21 DIAGNOSIS — I2510 Atherosclerotic heart disease of native coronary artery without angina pectoris: Secondary | ICD-10-CM

## 2011-10-21 DIAGNOSIS — I2109 ST elevation (STEMI) myocardial infarction involving other coronary artery of anterior wall: Secondary | ICD-10-CM

## 2011-10-21 DIAGNOSIS — E669 Obesity, unspecified: Secondary | ICD-10-CM

## 2011-10-21 LAB — BASIC METABOLIC PANEL
BUN: 26 mg/dL — ABNORMAL HIGH (ref 6–23)
Chloride: 98 mEq/L (ref 96–112)
Glucose, Bld: 98 mg/dL (ref 70–99)
Potassium: 3.9 mEq/L (ref 3.5–5.1)

## 2011-10-21 MED ORDER — FUROSEMIDE 40 MG PO TABS: 40.0000 mg | ORAL_TABLET | Freq: Every day | ORAL | Status: DC

## 2011-10-21 MED ORDER — TICAGRELOR 90 MG PO TABS: 90.0000 mg | ORAL_TABLET | Freq: Two times a day (BID) | ORAL | Status: DC

## 2011-10-21 MED ORDER — POTASSIUM CHLORIDE ER 10 MEQ PO TBCR: 20.0000 meq | EXTENDED_RELEASE_TABLET | Freq: Every day | ORAL | Status: DC

## 2011-10-21 MED ORDER — LISINOPRIL 2.5 MG PO TABS: 2.5000 mg | ORAL_TABLET | Freq: Every day | ORAL | Status: DC

## 2011-10-21 MED ORDER — ASPIRIN 81 MG PO CHEW: 81.0000 mg | CHEWABLE_TABLET | Freq: Every day | ORAL | Status: AC

## 2011-10-21 MED ORDER — ROSUVASTATIN CALCIUM 40 MG PO TABS: 40.0000 mg | ORAL_TABLET | Freq: Every day | ORAL | Status: DC

## 2011-10-21 MED ORDER — CARVEDILOL 12.5 MG PO TABS: 12.5000 mg | ORAL_TABLET | Freq: Two times a day (BID) | ORAL | Status: DC

## 2011-10-21 MED ORDER — CARVEDILOL 12.5 MG PO TABS
12.5000 mg | ORAL_TABLET | Freq: Two times a day (BID) | ORAL | Status: DC
  Filled 2011-10-21 (×2): qty 1

## 2011-10-21 NOTE — Progress Notes (Signed)
Pt and wife given discharge instructions. Verbalized understanding to f/u with DR. Varanasi on 1/16 at 1:45. Pt nor wife have any questions at this time. IV was removed and cardiac telemetry was returned to monitor. Pt left nit in a wheelchair wife wife. Ramond Craver, RN

## 2011-10-21 NOTE — Progress Notes (Signed)
Pt ambulated in hallway with wife. HR after walk 107 BBM. Gait stead. No c/o CP or SOB. Pt stated "I'm ready to go home". Will continue to monitor. Ramond Craver, RN

## 2011-10-21 NOTE — Discharge Summary (Signed)
Patient ID: Brian Gross MRN: [id number] DOB/AGE: 44-Jul-1967 44 y.o.  Admit date: 10/18/2011 Discharge date: 10/21/2011  Primary Discharge Diagnosis Acute anterior wall MI  Secondary Discharge Diagnosis CAD, obesity, hyperlipidemia  Significant Diagnostic Studies: angiography: cardiac cath with drug eluting stent placement to the LAD.  LVEF 30%.  Consults: none  Hospital Course: 46 y/o who presented fairly late after the onset of symptoms of chest pain.  He had sx for about 8 hours before coming to teh hospital.  ECG showed anterior Q waves with mild ST elevation.  He had ongoing pain.  Cath showed occluded LAD which was successfully aspirated, ballooned and stented.  He had fluid overload in the hospital and was diuresed with Lasix.  His potassium was replaced.  He did well.  He was counseled by cardiac rehab.  He walked the halls without any problems.  He was somewhat tachycardic at times but this responded to beta blockade.  Meds for systolic dysfunction were initiated and titrated.  Lipid lowering therapy was started as well.     Discharge Exam: Blood pressure 102/72, pulse 99, temperature 97.8 F (36.6 C), temperature source Oral, resp. rate 20, height 5\' 8"  (1.727 m), weight 131.5 kg (289 lb 14.5 oz), SpO2 97.00%.   General appearance: alert, cooperative, no distress and morbidly obese [place]/AT RRR, S1, S2 CTA bilaterally Obese No edema No focal deficits  Labs:   Lab Results  Component Value Date   WBC 14.1* 10/20/2011   HGB 14.8 10/20/2011   HCT 44.7 10/20/2011   MCV 89.8 10/20/2011   PLT 217 10/20/2011    Lab 10/21/11 0622 10/18/11 1000  NA 137 --  K 3.9 --  CL 98 --  CO2 26 --  BUN 26* --  CREATININE 1.34 --  CALCIUM 9.4 --  PROT -- 7.4  BILITOT -- 0.3  ALKPHOS -- 102  ALT -- 32  AST -- 80*  GLUCOSE 98 --   Lab Results  Component Value Date   CKTOTAL 1614* 10/19/2011   CKMB 72.5* 10/19/2011   TROPONINI >25.00* 10/19/2011    Lab Results  Component Value Date   CHOL  222* 10/18/2011   CHOL 200 03/08/2007   Lab Results  Component Value Date   HDL 48 10/18/2011   HDL 25.4* 03/08/2007   Lab Results  Component Value Date   LDLCALC 143* 10/18/2011   LDLCALC 145* 03/08/2007   Lab Results  Component Value Date   TRIG 156* 10/18/2011   TRIG 150* 03/08/2007   Lab Results  Component Value Date   CHOLHDL 4.6 10/18/2011   CHOLHDL 7.9 CALC 03/08/2007   No results found for this basename: LDLDIRECT       EKG: NSR, mild persistent anterior ST elevation; anterior Q waves  FOLLOW UP PLANS AND APPOINTMENTS  Will consider bringing the patient back for PCI attempt of the CTO of the RCA at a later time.  ECG revealed persistent ST elevation in the anterior and lateral leads.  This should be noted if he comes back to the ER.  Current Discharge Medication List    START taking these medications   Details  aspirin 81 MG chewable tablet Chew 1 tablet (81 mg total) by mouth daily.    carvedilol (COREG) 12.5 MG tablet Take 1 tablet (12.5 mg total) by mouth 2 (two) times daily with a meal. Qty: 60 tablet, Refills: 11    furosemide (LASIX) 40 MG tablet Take 1 tablet (40 mg total) by mouth daily. Qty: 30  tablet, Refills: 11    lisinopril (PRINIVIL,ZESTRIL) 2.5 MG tablet Take 1 tablet (2.5 mg total) by mouth daily. Qty: 30 tablet, Refills: 11    potassium chloride (K-DUR) 10 MEQ tablet Take 2 tablets (20 mEq total) by mouth daily. Qty: 60 tablet, Refills: 11    rosuvastatin (CRESTOR) 40 MG tablet Take 1 tablet (40 mg total) by mouth daily at 6 PM. Qty: 30 tablet, Refills: 11    Ticagrelor (BRILINTA) 90 MG TABS tablet Take 1 tablet (90 mg total) by mouth 2 (two) times daily. Qty: 60 tablet, Refills: 11      CONTINUE these medications which have NOT CHANGED   Details  albuterol (PROVENTIL HFA;VENTOLIN HFA) 108 (90 BASE) MCG/ACT inhaler Inhale 2 puffs into the lungs every 6 (six) hours as needed. For wheezing        Follow-up Information    Follow up  with Corky Crafts., MD on 11/03/2011. (1:45 PM)    Contact information:   [address] [place] [address] [phone]          BRING ALL MEDICATIONS WITH YOU TO FOLLOW UP APPOINTMENTS  Time spent with patient to include physician time:25 minutes Signed: Coree Brame S. 10/21/2011, 10:55 AM

## 2011-10-21 NOTE — Progress Notes (Signed)
Pt walking independently. Feels better. Ed completed. Requests his name be sent to [facility]. Reinforced diet change/lifestyle change. Motivated to change. [phone] Ethelda Chick CES, ACSM

## 2011-10-25 ENCOUNTER — Encounter: Payer: Self-pay | Admitting: Cardiology

## 2011-10-25 ENCOUNTER — Ambulatory Visit (INDEPENDENT_AMBULATORY_CARE_PROVIDER_SITE_OTHER): Payer: PRIVATE HEALTH INSURANCE | Admitting: Internal Medicine

## 2011-10-25 ENCOUNTER — Telehealth: Payer: Self-pay | Admitting: Internal Medicine

## 2011-10-25 DIAGNOSIS — J45909 Unspecified asthma, uncomplicated: Secondary | ICD-10-CM | POA: Insufficient documentation

## 2011-10-25 DIAGNOSIS — R7309 Other abnormal glucose: Secondary | ICD-10-CM

## 2011-10-25 DIAGNOSIS — R7303 Prediabetes: Secondary | ICD-10-CM

## 2011-10-25 DIAGNOSIS — I2510 Atherosclerotic heart disease of native coronary artery without angina pectoris: Secondary | ICD-10-CM

## 2011-10-25 MED ORDER — PREDNISONE 10 MG PO TABS: ORAL_TABLET | ORAL | Status: DC

## 2011-10-25 MED ORDER — HYDROCODONE-HOMATROPINE 5-1.5 MG/5ML PO SYRP: 5.0000 mL | ORAL_SOLUTION | Freq: Four times a day (QID) | ORAL | Status: AC | PRN

## 2011-10-25 MED ORDER — AZITHROMYCIN 250 MG PO TABS: ORAL_TABLET | ORAL | Status: AC

## 2011-10-25 NOTE — Progress Notes (Signed)
  Subjective:    Patient ID: Brian Gross, male    DOB: [birth year], 46 y.o.   MRN: [id number]  HPI Acute visit Three-day history of dry cough,  hard to sleep at night, unconfortable when he lays down--->  apparently he gets moderately short of breath and to some extent the cough is exacerbated as well. Has a h/o asthma, has not used his albuterol much up until 3 days ago when the symptoms started. He was recently admitted to the hospital with an acute MI, hospital chart reviewed, he  presented to his cardiologist's office with above  symptoms today, they did a cardiac evaluation he was deemed to be stable his symptoms are thought to be respiratory in nature. They ordered a chest x-ray,  Result not available to me at this point.  Past Medical History: H/o asthma chronic granulomatosis (dx at age 46 @ [facility]) borderline DM Gout 10-07-11: Acute MI, anterior wall, status post drug-eluting stent Dr Eldridge Dace  Past Surgical History: Cholecystectomy  Family History: DM-- F side, GF MI-- F CABG age 43   Social History: married-- yes children x 1 tobacco--no ETOH-- socially    Review of Systems Initially had fever at 101.3. No nausea, vomiting, diarrhea Has mild sinus pain, no global headaches. Note please send any sputum or nasal discharge     Objective:   Physical Exam  Constitutional: He is oriented to person, place, and time. He appears well-developed. No distress.  HENT:  Head: Normocephalic and atraumatic.  Right Ear: External ear normal.       Left ear with what. Face symmetric, nontender to palpation. Nose slightly congested  Cardiovascular: Normal rate, regular rhythm and normal heart sounds.   No murmur heard. Pulmonary/Chest:       No respiratory distress but he is coughing, he has audible wheezing . On auscultation his breath sounds are slightly decreased, he has end expiratory wheezing, no crackles or rhonchi.  Musculoskeletal: He exhibits no edema.   Calves are symmetric and not tender.  Neurological: He is alert and oriented to person, place, and time.  Skin: He is not diaphoretic.  Psychiatric: He has a normal mood and affect. His behavior is normal.       Assessment & Plan:

## 2011-10-25 NOTE — Assessment & Plan Note (Signed)
Recent MI. Per cardiology

## 2011-10-25 NOTE — Assessment & Plan Note (Addendum)
Presents today with asthma exacerbation likely triggered by a infection as he had fever 3 days ago. Will treat him with prednisone, antibiotics, he needs to continue with inhalers. We'll have to see in the future if beta blockers prescribed for coronary artery disease are bothering his asthma.  A chest x-ray was done today at another office, we'll try to get the report . He has difficulty sleeping when he lays down, apparently that triggers some cough. This sx  suggest othopnea  but he went to see his heart doctor this morning and is thought to be stable from the cardiac standpoint. Addendum: CXR  With bronchitis changes, enlarged heart and pulmonary vascular congestion. No pneumonia -------> plan is the same for now  Today , I spent more than 25 min with the patient, >50% of the time counseling, and coordinating his care

## 2011-10-25 NOTE — Telephone Encounter (Signed)
Pt is scheduled °

## 2011-10-25 NOTE — Patient Instructions (Addendum)
Rest, fluids , tylenol For cough, take Mucinex DM twice a day as needed  If the cough continue , take hydrocodone, will cause drowsiness  For  wheezing, use of Ventolin 2 puffs every 6 hours as needed Prednisone as prescriptions Take the antibiotic as prescribed ----> Zithromax  Call if no better in few days Call or go to the ER if he has severe symptoms such as high fever, increased shortness of breath, not able to stop wheezing, chest pain Came back in 3 weeks

## 2011-10-25 NOTE — Assessment & Plan Note (Signed)
A1c 5.5 few days ago

## 2011-10-25 NOTE — Telephone Encounter (Signed)
Phone call from the cardiology office, one of the nurse practitioners for Dr Quentin Ore reported that  Brian Gross just had an MI, was seen acutely today at the cardiology office due to shortness of breath, picture is one of an upper respiratory tract infection. They're doing x-ray. I advised him to come by to see me at 10:30 tomorrow morning for a work in. Please put him in my schedule

## 2011-10-25 NOTE — Telephone Encounter (Signed)
Patient seen today acutely

## 2011-10-26 ENCOUNTER — Ambulatory Visit: Payer: PRIVATE HEALTH INSURANCE | Admitting: Internal Medicine

## 2011-10-28 ENCOUNTER — Telehealth: Payer: Self-pay | Admitting: *Deleted

## 2011-10-28 NOTE — Telephone Encounter (Signed)
Left message to call office

## 2011-10-28 NOTE — Telephone Encounter (Signed)
Message copied by [name] on Thu Oct 28, 2011  5:08 PM ------      Message from: Brian Gross      Created: Tue Oct 26, 2011  8:05 AM       Please check on patient, better?

## 2011-10-29 NOTE — Telephone Encounter (Signed)
Left message to call office

## 2011-11-02 ENCOUNTER — Encounter: Payer: Self-pay | Admitting: *Deleted

## 2011-11-02 NOTE — Telephone Encounter (Signed)
Left message to call office. .Letter Mail after several attempts to contact Pt. 

## 2011-11-03 ENCOUNTER — Telehealth: Payer: Self-pay | Admitting: Internal Medicine

## 2011-11-03 NOTE — Telephone Encounter (Signed)
Pt's wife states pt is much better but still has a cough.

## 2011-11-03 NOTE — Telephone Encounter (Signed)
Patient's wife, Burna Mortimer, is returning multiple calls from our office, which were to call & check to see how patient is doing.  She states patient is doing much better, and to please return her call.

## 2011-11-15 ENCOUNTER — Encounter: Payer: Self-pay | Admitting: Internal Medicine

## 2011-11-15 ENCOUNTER — Encounter: Payer: PRIVATE HEALTH INSURANCE | Admitting: Internal Medicine

## 2011-11-16 ENCOUNTER — Ambulatory Visit (INDEPENDENT_AMBULATORY_CARE_PROVIDER_SITE_OTHER): Payer: PRIVATE HEALTH INSURANCE | Admitting: Internal Medicine

## 2011-11-16 ENCOUNTER — Encounter: Payer: Self-pay | Admitting: Internal Medicine

## 2011-11-16 DIAGNOSIS — I2510 Atherosclerotic heart disease of native coronary artery without angina pectoris: Secondary | ICD-10-CM

## 2011-11-16 DIAGNOSIS — J45909 Unspecified asthma, uncomplicated: Secondary | ICD-10-CM

## 2011-11-16 MED ORDER — BUDESONIDE 180 MCG/ACT IN AEPB: 1.0000 | INHALATION_SPRAY | Freq: Two times a day (BID) | RESPIRATORY_TRACT | Status: DC

## 2011-11-16 NOTE — Assessment & Plan Note (Addendum)
Improved, still occ SOB, saw cards last week, told to go back to regular activity. He still has occasional shortness of breath , may be asthma related. Plan: Gradual return to exercise Add pulmicort Flu shot today

## 2011-11-16 NOTE — Progress Notes (Signed)
  Subjective:    Patient ID: Brian Gross, male    DOB: 17-Aug-1966, 46 y.o.   MRN: [id number]  HPI Followup from previous visit. Asthma much better. Today he tells me that he has been drinking alcohol heavily, he quit after his heart attack and does not plan to go back to it.  Past Medical History:  H/o asthma  chronic granulomatosis (dx at age 18 @ [facility])  borderline DM  Gout  10-07-11: Acute MI, anterior wall, status post drug-eluting stent Dr Eldridge Dace   Past Surgical History:  Cholecystectomy   Family History:  DM-- F side, GF  MI-- F CABG age 49   Social History:  married-- yes  children x 1  tobacco--no  ETOH-- reports heavy use up until 12- 2012   Review of Systems Cough, wheezing resolved Denies any chest pain or lower extremity edema Saw cardiology last week, was cleared from the cardiac standpoint. Still has occasional shortness of breath    Objective:   Physical Exam  Constitutional: He appears well-developed. No distress.  HENT:  Head: Normocephalic and atraumatic.  Cardiovascular: Normal rate, regular rhythm and normal heart sounds.   No murmur heard. Pulmonary/Chest: Effort normal and breath sounds normal. No respiratory distress. He has no wheezes. He has no rales.  Skin: He is not diaphoretic.       Assessment & Plan:

## 2011-11-16 NOTE — Assessment & Plan Note (Signed)
Saw cardiology last week was told he was doing okay and back to regular duties. I had a long conversation about diet and the need to lose weight. He just saw a nutritionist in the hospital and has all the information he needs per patient

## 2011-11-17 NOTE — Progress Notes (Signed)
This encounter was created in error - please disregard.

## 2011-12-01 ENCOUNTER — Ambulatory Visit (INDEPENDENT_AMBULATORY_CARE_PROVIDER_SITE_OTHER): Payer: PRIVATE HEALTH INSURANCE | Admitting: Cardiology

## 2011-12-01 ENCOUNTER — Encounter: Payer: Self-pay | Admitting: Cardiology

## 2011-12-01 DIAGNOSIS — Z79899 Other long term (current) drug therapy: Secondary | ICD-10-CM

## 2011-12-01 DIAGNOSIS — I2589 Other forms of chronic ischemic heart disease: Secondary | ICD-10-CM

## 2011-12-01 DIAGNOSIS — I255 Ischemic cardiomyopathy: Secondary | ICD-10-CM

## 2011-12-01 DIAGNOSIS — E782 Mixed hyperlipidemia: Secondary | ICD-10-CM

## 2011-12-01 DIAGNOSIS — I2510 Atherosclerotic heart disease of native coronary artery without angina pectoris: Secondary | ICD-10-CM

## 2011-12-01 DIAGNOSIS — E785 Hyperlipidemia, unspecified: Secondary | ICD-10-CM

## 2011-12-01 DIAGNOSIS — E669 Obesity, unspecified: Secondary | ICD-10-CM

## 2011-12-01 MED ORDER — FUROSEMIDE 20 MG PO TABS: 20.0000 mg | ORAL_TABLET | Freq: Every day | ORAL | Status: DC

## 2011-12-01 NOTE — Assessment & Plan Note (Signed)
The patient understands the need to lose weight with diet and exercise. We have discussed specific strategies for this.  

## 2011-12-01 NOTE — Assessment & Plan Note (Signed)
He has not tolerated the ACE inhibitor though I might restart this in the future. He was a late presentation I suspect his EF will not improve much. He and I will have to talk about a defibrillator in the future though I will do this after a followup echocardiogram. Today we had a long conversation about the fact that he will need lifetime medications and followup with the cardiologist. He was somewhat shocked and overwhelmed by this so I did not bring up the subject of a defibrillator on this initial visit.

## 2011-12-01 NOTE — Progress Notes (Signed)
HPI The patient presents as a new patient. I did see him many years ago to evaluate atypical chest pain. He had a negative stress perfusion study in 2008. Unfortunately he presented in late December of last year with an anterior myocardial infarction. Sounds like this was a late presentation. He did have an occluded LAD which was treated with a drug-eluting stent. There was also RCA occlusion which was apparently chronic. There was a consideration any him back to revascularization. Ejection fraction at the time of his infarct was 30%. He is now switching his care to this practice.  Since his infarct he has had no further chest pain similar to his previous angina. He denies any chest pressure, neck or arm discomfort. He has had no palpitations, presyncope or syncope. He has had no PND or orthopnea. He will get short of breath with some activities but he still able to do his job which includes some exerting work. He's watching his diet. He did stop lisinopril as it made him nauseated.  Allergies  Allergen Reactions  . Morphine     Reaction unknown    Current Outpatient Prescriptions  Medication Sig Dispense Refill  . albuterol (PROVENTIL HFA;VENTOLIN HFA) 108 (90 BASE) MCG/ACT inhaler Inhale 2 puffs into the lungs every 6 (six) hours as needed. For wheezing       . aspirin 81 MG chewable tablet Chew 1 tablet (81 mg total) by mouth daily.      . budesonide (PULMICORT) 180 MCG/ACT inhaler Inhale 1 puff into the lungs 2 (two) times daily.  1 Inhaler  6  . carvedilol (COREG) 12.5 MG tablet Take 1 tablet (12.5 mg total) by mouth 2 (two) times daily with a meal.  60 tablet  11  . potassium chloride (K-DUR) 10 MEQ tablet Take 2 tablets (20 mEq total) by mouth daily.  60 tablet  11  . rosuvastatin (CRESTOR) 40 MG tablet Take 1 tablet (40 mg total) by mouth daily at 6 PM.  30 tablet  11  . Ticagrelor (BRILINTA) 90 MG TABS tablet Take 1 tablet (90 mg total) by mouth 2 (two) times daily.  60 tablet  11  .  furosemide (LASIX) 40 MG tablet Take 1 tablet (40 mg total) by mouth daily.  30 tablet  11    Past Medical History  Diagnosis Date  . 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 Gout   . Borderline diabetes   . Asthma   . CHF (congestive heart failure)   . Chest pain   . SOB (shortness of breath)   . MI (myocardial infarction)   . CAD (coronary artery disease)     Anterior wall myocardial infarction December 2012  . Obesity     Past Surgical History  Procedure Date  . Cholecystectomy   . Lung biopsy 1990s    Family History  Problem Relation Age of Onset  . Coronary artery disease Father 11    CABG    History   Social History  . Marital Status: Single    Spouse Name: N/A    Number of Children: 1  . Years of Education: N/A   Occupational History  . Flooring    Social History Main Topics  . Smoking status: Former Smoker    Types: Cigarettes    Quit date: 10/17/1989  . Smokeless tobacco: Not on file  . Alcohol Use: 0.6 oz/week    1 Cans of beer per  week  . Drug Use: 2 per week    Special: Marijuana  . Sexually Active:    Other Topics Concern  . Not on file   Social History Narrative       ROS:  As stated in the HPI and negative for all other systems.  PHYSICAL EXAM BP 100/70  Pulse 72  Ht 5\' 9"  (1.753 m)  Wt 291 lb (131.997 kg)  BMI 42.97 kg/m2 GENERAL:  Well appearing HEENT:  Pupils equal round and reactive, fundi not visualized, oral mucosa unremarkable NECK:  No jugular venous distention, waveform within normal limits, carotid upstroke brisk and symmetric, no bruits, no thyromegaly LYMPHATICS:  No cervical, inguinal adenopathy LUNGS:  Clear to auscultation bilaterally BACK:  No CVA tenderness CHEST:  Unremarkable HEART:  PMI not displaced or sustained,S1 and S2 within normal limits, no S3, no S4, no clicks, no rubs, no murmurs ABD:  Flat, positive bowel sounds normal in frequency in pitch, no bruits, no rebound,  no guarding, no midline pulsatile mass, no hepatomegaly, no splenomegaly EXT:  2 plus pulses throughout, no edema, no cyanosis no clubbing SKIN:  No rashes no nodules NEURO:  Cranial nerves II through XII grossly intact, motor grossly intact throughout PSYCH:  Cognitively intact, oriented to person place and time   ASSESSMENT AND PLAN

## 2011-12-01 NOTE — Assessment & Plan Note (Signed)
He will get fasting lipid profile in 2 weeks with a goal LDL less than 70 and HDL greater than 50 given his young age.

## 2011-12-01 NOTE — Assessment & Plan Note (Signed)
I reviewed his catheterization films. There was consideration of further PCI. He does have some residual stenosis in a diagonal branch. He also has a chronically occluded right coronary. At this point I would manage this medically with aggressive risk reduction.

## 2011-12-01 NOTE — Patient Instructions (Signed)
Please decrease Lasix to 20 mg a day Continue all other medications as listed  Please return fasting in 2 weeks for blood work (lipid and liver)  Please see Dr Antoine Poche back in one month.

## 2011-12-15 ENCOUNTER — Other Ambulatory Visit: Payer: PRIVATE HEALTH INSURANCE

## 2011-12-16 ENCOUNTER — Other Ambulatory Visit (INDEPENDENT_AMBULATORY_CARE_PROVIDER_SITE_OTHER): Payer: PRIVATE HEALTH INSURANCE

## 2011-12-16 DIAGNOSIS — E782 Mixed hyperlipidemia: Secondary | ICD-10-CM

## 2011-12-16 DIAGNOSIS — Z79899 Other long term (current) drug therapy: Secondary | ICD-10-CM

## 2011-12-16 LAB — LIPID PANEL
HDL: 29.5 mg/dL — ABNORMAL LOW (ref >39.00)
Total CHOL/HDL Ratio: 3
VLDL: 23.8 mg/dL (ref 0.0–40.0)

## 2011-12-16 LAB — HEPATIC FUNCTION PANEL
ALT: 17 U/L (ref 0–53)
Total Bilirubin: 0.2 mg/dL — ABNORMAL LOW (ref 0.3–1.2)

## 2011-12-20 ENCOUNTER — Encounter: Payer: Self-pay | Admitting: *Deleted

## 2012-01-06 ENCOUNTER — Ambulatory Visit (INDEPENDENT_AMBULATORY_CARE_PROVIDER_SITE_OTHER): Payer: PRIVATE HEALTH INSURANCE | Admitting: Cardiology

## 2012-01-06 ENCOUNTER — Encounter: Payer: Self-pay | Admitting: Cardiology

## 2012-01-06 VITALS — BP 115/80 | HR 72 | Ht 69.0 in | Wt 279.0 lb

## 2012-01-06 DIAGNOSIS — I255 Ischemic cardiomyopathy: Secondary | ICD-10-CM

## 2012-01-06 DIAGNOSIS — I2589 Other forms of chronic ischemic heart disease: Secondary | ICD-10-CM

## 2012-01-06 DIAGNOSIS — E669 Obesity, unspecified: Secondary | ICD-10-CM

## 2012-01-06 DIAGNOSIS — I2510 Atherosclerotic heart disease of native coronary artery without angina pectoris: Secondary | ICD-10-CM

## 2012-01-06 MED ORDER — LISINOPRIL 2.5 MG PO TABS: 2.5000 mg | ORAL_TABLET | Freq: Every day | ORAL | Status: DC

## 2012-01-06 NOTE — Progress Notes (Signed)
HPI The patient presents as a new patient. I did see him many years ago to evaluate atypical chest pain. He had a negative stress perfusion study in 2008. Unfortunately he presented in late December of last year with an anterior myocardial infarction. Sounds like this was a late presentation. He did have an occluded LAD which was treated with a drug-eluting stent. There was also RCA occlusion which was apparently chronic. There was a consideration any him back to revascularization. Ejection fraction at the time of his infarction  At the last visit we had a long discussion about his ischemic cardiomyopathy. He is slow to understand and accept these concepts.  He has changed his lifestyle and has lost 20 pounds. He is doing some walking and dieting and has lost 20 lbs. The patient denies any new symptoms such as chest discomfort, neck or arm discomfort. There has been no new shortness of breath, PND or orthopnea. There have been no reported palpitations, presyncope or syncope.   Allergies  Allergen Reactions  . Morphine     Reaction unknown    Current Outpatient Prescriptions  Medication Sig Dispense Refill  . albuterol (PROVENTIL HFA;VENTOLIN HFA) 108 (90 BASE) MCG/ACT inhaler Inhale 2 puffs into the lungs every 6 (six) hours as needed. For wheezing       . aspirin 81 MG chewable tablet Chew 1 tablet (81 mg total) by mouth daily.      . budesonide (PULMICORT) 180 MCG/ACT inhaler Inhale 1 puff into the lungs 2 (two) times daily.  1 Inhaler  6  . carvedilol (COREG) 12.5 MG tablet Take 1 tablet (12.5 mg total) by mouth 2 (two) times daily with a meal.  60 tablet  11  . furosemide (LASIX) 20 MG tablet Take 1 tablet (20 mg total) by mouth daily.  30 tablet  11  . potassium chloride (K-DUR) 10 MEQ tablet Take 2 tablets (20 mEq total) by mouth daily.  60 tablet  11  . rosuvastatin (CRESTOR) 40 MG tablet Take 1 tablet (40 mg total) by mouth daily at 6 PM.  30 tablet  11  . Ticagrelor (BRILINTA) 90 MG  TABS tablet Take 1 tablet (90 mg total) by mouth 2 (two) times daily.  60 tablet  11    Past Medical History  Diagnosis Date  . Granulomatosis     chronic granulomatosis (dx at age 40 @ [facility])  . Gout   . Borderline diabetes   . Asthma   . CHF (congestive heart failure)   . Chest pain   . SOB (shortness of breath)   . MI (myocardial infarction)   . CAD (coronary artery disease)     Anterior wall myocardial infarction December 2012  . Obesity     Past Surgical History  Procedure Date  . Cholecystectomy   . Lung biopsy 1990s    Family History  Problem Relation Age of Onset  . Coronary artery disease Father 38    CABG    History   Social History  . Marital Status: Single    Spouse Name: N/A    Number of Children: 1  . Years of Education: N/A   Occupational History  . Flooring    Social History Main Topics  . Smoking status: Former Smoker    Types: Cigarettes    Quit date: 10/17/1989  . Smokeless tobacco: Not on file  . Alcohol Use: 0.6 oz/week    1 Cans of beer per week  . Drug Use:  2 per week    Special: Marijuana  . Sexually Active:    Other Topics Concern  . Not on file   Social History Narrative       ROS:  As stated in the HPI and negative for all other systems.  PHYSICAL EXAM BP 115/80  Pulse 72  Ht 5\' 9"  (1.753 m)  Wt 279 lb (126.554 kg)  BMI 41.20 kg/m2 GENERAL:  Well appearing HEENT:  Pupils equal round and reactive, fundi not visualized, oral mucosa unremarkable NECK:  No jugular venous distention, waveform within normal limits, carotid upstroke brisk and symmetric, no bruits, no thyromegaly LYMPHATICS:  No cervical, inguinal adenopathy LUNGS:  Clear to auscultation bilaterally BACK:  No CVA tenderness CHEST:  Unremarkable HEART:  PMI not displaced or sustained,S1 and S2 within normal limits, no S3, no S4, no clicks, no rubs, no murmurs ABD:  Flat, positive bowel sounds normal in frequency in pitch, no bruits, no rebound, no  guarding, no midline pulsatile mass, no hepatomegaly, no splenomegaly, obese EXT:  2 plus pulses throughout, no edema, no cyanosis no clubbing  ASSESSMENT AND PLAN

## 2012-01-06 NOTE — Patient Instructions (Signed)
Please restart Lisinopril 2.5 mg a day. The current medical regimen is effective;  continue present plan and medications.  Follow up with Dr Antoine Poche in 1 month.

## 2012-01-06 NOTE — Assessment & Plan Note (Signed)
He is willing to retry taking his ACE inhibitor. He'll be on lisinopril 2.5 mg daily. We will continue the other meds as listed. As mentioned we can discuss an ICD in the future.  However, he still coming to a slow understanding of his illness and his this conversation. I

## 2012-01-06 NOTE — Assessment & Plan Note (Signed)
I encourage continued weight loss.

## 2012-01-06 NOTE — Assessment & Plan Note (Signed)
The patient has no new sypmtoms.  No further cardiovascular testing is indicated.  We will continue with aggressive risk reduction and meds as listed.  

## 2012-02-14 ENCOUNTER — Ambulatory Visit (INDEPENDENT_AMBULATORY_CARE_PROVIDER_SITE_OTHER): Payer: PRIVATE HEALTH INSURANCE | Admitting: Internal Medicine

## 2012-02-14 ENCOUNTER — Encounter: Payer: Self-pay | Admitting: Internal Medicine

## 2012-02-14 DIAGNOSIS — I2589 Other forms of chronic ischemic heart disease: Secondary | ICD-10-CM

## 2012-02-14 DIAGNOSIS — R7309 Other abnormal glucose: Secondary | ICD-10-CM

## 2012-02-14 DIAGNOSIS — J45909 Unspecified asthma, uncomplicated: Secondary | ICD-10-CM

## 2012-02-14 DIAGNOSIS — I255 Ischemic cardiomyopathy: Secondary | ICD-10-CM

## 2012-02-14 DIAGNOSIS — R7303 Prediabetes: Secondary | ICD-10-CM

## 2012-02-14 DIAGNOSIS — K649 Unspecified hemorrhoids: Secondary | ICD-10-CM | POA: Insufficient documentation

## 2012-02-14 LAB — HEMOGLOBIN: Hemoglobin: 14.4 g/dL (ref 13.0–17.0)

## 2012-02-14 LAB — BASIC METABOLIC PANEL
GFR: 58.21 mL/min — ABNORMAL LOW (ref >60.00)
Glucose, Bld: 89 mg/dL (ref 70–99)
Potassium: 4.1 mEq/L (ref 3.5–5.1)
Sodium: 138 mEq/L (ref 135–145)

## 2012-02-14 MED ORDER — HYDROCORTISONE ACETATE 25 MG RE SUPP: 25.0000 mg | Freq: Two times a day (BID) | RECTAL | Status: AC

## 2012-02-14 MED ORDER — HYDROCORTISONE 2.5 % EX CREA: TOPICAL_CREAM | Freq: Two times a day (BID) | CUTANEOUS | Status: AC

## 2012-02-14 NOTE — Assessment & Plan Note (Signed)
Complain of occasional rectal bleeding, mostly blood in the toilet paper as well as some perianal irritation. Exam show only internal hemorrhoids and some skin irritation. Plan: Check a Hg  See instructions

## 2012-02-14 NOTE — Assessment & Plan Note (Signed)
Lisinopril restarted 01/06/2012, good tolerance, labs

## 2012-02-14 NOTE — Patient Instructions (Signed)
Use a suppository twice a day for 3 days Use baby wipes and the hydrocortisone creams as needed. Use powder to keep the area dry Call if the bleeding gets worse or if  pain or severe itching.

## 2012-02-14 NOTE — Assessment & Plan Note (Signed)
Has lost 20 pounds, doing great.

## 2012-02-14 NOTE — Assessment & Plan Note (Signed)
Doing great, no change. Currently on a low dose of lisinopril without apparent ill effects

## 2012-02-14 NOTE — Progress Notes (Signed)
  Subjective:    Patient ID: Brian Gross, male    DOB: 19-Aug-1966, 46 y.o.   MRN: [id number]  HPI Routine office visit Asthma, good compliance with inhalers as prescribed, hardly ever uses Ventolin. Complains of rectal bleeding on and off, occasional itching, and no rectal pain. Symptoms were more infrequent up until his heart attack but now he sees blood more often. No dripping fresh blood but see blood in the toilet paper. Heart disease, notes from cardiology reviewed, he restarted lisinopril 01/06/2012, he doesn't  Have  Cough.  Past Medical History:  H/o asthma  chronic granulomatosis (dx at age 44 @ [facility])  borderline DM  Gout  10-07-11: Acute MI, anterior wall, status post drug-eluting stent Dr Eldridge Dace  Past Surgical History:  Cholecystectomy  Family History:  DM-- F side, GF  MI-- F CABG age 94  Social History:  married-- yes  children x 1  tobacco--no  ETOH-- reports heavy use up until 12- 2012   Review of Systems Normal nausea, vomiting, diarrhea. No chest pain, shortness of breath or lower extremity edema. Doing great with diet and has lost approximately 20 pounds    Objective:   Physical Exam  General -- alert, well-developed, and overweight appearing. No apparent distress.  Lungs -- normal respiratory effort, no intercostal retractions, no accessory muscle use, and normal breath sounds.   Heart-- normal rate, regular rhythm, no murmur, and no gallop.   Rectal exam-- external exam showed no external hemorrhoids and mild erythema around the anus. Digital rectal exam shows some discomfort and a normal prostate. Anoscopy showed a few internal hemorrhoids, no active bleeding. Extremities-- no pretibial edema bilaterally  Neurologic-- alert & oriented X3 and strength normal in all extremities. Psych-- Cognition and judgment appear intact. Alert and cooperative with normal attention span and concentration.  not anxious appearing and not depressed appearing.         Assessment & Plan:

## 2012-02-15 ENCOUNTER — Encounter: Payer: Self-pay | Admitting: Cardiology

## 2012-02-15 ENCOUNTER — Ambulatory Visit (INDEPENDENT_AMBULATORY_CARE_PROVIDER_SITE_OTHER): Payer: PRIVATE HEALTH INSURANCE | Admitting: Cardiology

## 2012-02-15 DIAGNOSIS — I2510 Atherosclerotic heart disease of native coronary artery without angina pectoris: Secondary | ICD-10-CM

## 2012-02-15 DIAGNOSIS — I2589 Other forms of chronic ischemic heart disease: Secondary | ICD-10-CM

## 2012-02-15 DIAGNOSIS — I428 Other cardiomyopathies: Secondary | ICD-10-CM

## 2012-02-15 DIAGNOSIS — E669 Obesity, unspecified: Secondary | ICD-10-CM

## 2012-02-15 DIAGNOSIS — E785 Hyperlipidemia, unspecified: Secondary | ICD-10-CM

## 2012-02-15 DIAGNOSIS — I255 Ischemic cardiomyopathy: Secondary | ICD-10-CM

## 2012-02-15 MED ORDER — LISINOPRIL 5 MG PO TABS: 5.0000 mg | ORAL_TABLET | Freq: Every day | ORAL | Status: DC

## 2012-02-15 NOTE — Assessment & Plan Note (Signed)
His weight loss has hit a plateau. I have reviewed with him the importance of continued weight loss.

## 2012-02-15 NOTE — Assessment & Plan Note (Signed)
His LDL is so recently I cut back on his Crestor. We will repeat this reading future appointment.

## 2012-02-15 NOTE — Patient Instructions (Signed)
Please increase your Lisinopril to 5 mg a day Continue all other medications as listed  Your physician has requested that you have an echocardiogram in 6 weeks. Echocardiography is a painless test that uses sound waves to create images of your heart. It provides your doctor with information about the size and shape of your heart and how well your heart's chambers and valves are working. This procedure takes approximately one hour. There are no restrictions for this procedure.  Follow up with Dr Antoine Poche in 6 weeks (same day as echo)

## 2012-02-15 NOTE — Progress Notes (Signed)
HPI The patient presents as a new patient. I did see him many years ago to evaluate atypical chest pain. He had a negative stress perfusion study in 2008. Unfortunately he presented in late December of last year with an anterior myocardial infarction. Sounds like this was a late presentation. He did have an occluded LAD which was treated with a drug-eluting stent. There was also RCA occlusion which was apparently chronic.  At the last visit we continued a long discussion about his ischemic cardiomyopathy. He seems to have a clear understanding of this. He's adopted some healthy lifestyle changes but not complete.  The patient denies any new symptoms such as chest discomfort, neck or arm discomfort. There has been no new shortness of breath, PND or orthopnea. There have been no reported palpitations, presyncope or syncope.  Allergies  Allergen Reactions  . Morphine     Reaction unknown    Current Outpatient Prescriptions  Medication Sig Dispense Refill  . albuterol (PROVENTIL HFA;VENTOLIN HFA) 108 (90 BASE) MCG/ACT inhaler Inhale 2 puffs into the lungs every 6 (six) hours as needed. For wheezing      . aspirin 81 MG chewable tablet Chew 1 tablet (81 mg total) by mouth daily.      . budesonide (PULMICORT) 180 MCG/ACT inhaler Inhale 1 puff into the lungs 2 (two) times daily.  1 Inhaler  6  . carvedilol (COREG) 12.5 MG tablet Take 1 tablet (12.5 mg total) by mouth 2 (two) times daily with a meal.  60 tablet  11  . furosemide (LASIX) 20 MG tablet Take 1 tablet (20 mg total) by mouth daily.  30 tablet  11  . hydrocortisone (ANUSOL-HC) 25 MG suppository Place 1 suppository (25 mg total) rectally 2 (two) times daily.  12 suppository  0  . hydrocortisone 2.5 % cream Apply topically 2 (two) times daily.  30 g  0  . lisinopril (ZESTRIL) 2.5 MG tablet Take 1 tablet (2.5 mg total) by mouth daily.  30 tablet  11  . potassium chloride (K-DUR) 10 MEQ tablet Take 2 tablets (20 mEq total) by mouth daily.   60 tablet  11  . Ticagrelor (BRILINTA) 90 MG TABS tablet Take 1 tablet (90 mg total) by mouth 2 (two) times daily.  60 tablet  11  . rosuvastatin (CRESTOR) 40 MG tablet Take 1 tablet (40 mg total) by mouth daily at 6 PM.  30 tablet  11  . DISCONTD: albuterol (VENTOLIN HFA) 108 (90 BASE) MCG/ACT inhaler Inhale 2 puffs into the lungs every 6 (six) hours as needed for wheezing.  1 Inhaler  1    Past Medical History  Diagnosis Date  . Granulomatosis     chronic granulomatosis (dx at age 72 @ [facility])  . Gout   . Borderline diabetes   . Asthma   . CHF (congestive heart failure)   . Chest pain   . SOB (shortness of breath)   . MI (myocardial infarction)   . CAD (coronary artery disease)     Anterior wall myocardial infarction December 2012  . Obesity     Past Surgical History  Procedure Date  . Cholecystectomy   . Lung biopsy 1990s   ROS:  As stated in the HPI and negative for all other systems.  PHYSICAL EXAM BP 118/75  Pulse 73  Ht 5\' 9"  (1.753 m)  Wt 276 lb (125.193 kg)  BMI 40.76 kg/m2 GENERAL:  Well appearing HEENT:  Pupils equal round and reactive, fundi  not visualized, oral mucosa unremarkable NECK:  No jugular venous distention, waveform within normal limits, carotid upstroke brisk and symmetric, no bruits, no thyromegaly LYMPHATICS:  No cervical, inguinal adenopathy LUNGS:  Clear to auscultation bilaterally BACK:  No CVA tenderness CHEST:  Unremarkable HEART:  PMI not displaced or sustained,S1 and S2 within normal limits, no S3, no S4, no clicks, no rubs, no murmurs ABD:  Flat, positive bowel sounds normal in frequency in pitch, no bruits, no rebound, no guarding, no midline pulsatile mass, no hepatomegaly, no splenomegaly, obese EXT:  2 plus pulses throughout, no edema, no cyanosis no clubbing  EKG:  Sinus rhythm, rate 73, low-voltage in limb leads, old inferior infarct, old anteroseptal infarct, no change from previous. 02/15/2012  ASSESSMENT AND PLAN

## 2012-02-15 NOTE — Assessment & Plan Note (Signed)
The patient has no new sypmtoms.  No further cardiovascular testing is indicated.  We will continue with aggressive risk reduction and meds as listed.  

## 2012-02-15 NOTE — Assessment & Plan Note (Signed)
I will increase the lisinopril to 5 mg daily.   In 6 weeks I will get an echocardiogram.  If his EF remains below 35% I will refer him for consideration of an ICD.

## 2012-02-17 ENCOUNTER — Encounter: Payer: Self-pay | Admitting: *Deleted

## 2012-03-27 ENCOUNTER — Other Ambulatory Visit (HOSPITAL_COMMUNITY): Payer: PRIVATE HEALTH INSURANCE

## 2012-03-27 ENCOUNTER — Ambulatory Visit: Payer: PRIVATE HEALTH INSURANCE | Admitting: Cardiology

## 2012-04-14 ENCOUNTER — Other Ambulatory Visit (HOSPITAL_COMMUNITY): Payer: PRIVATE HEALTH INSURANCE

## 2012-04-14 ENCOUNTER — Ambulatory Visit: Payer: PRIVATE HEALTH INSURANCE | Admitting: Cardiology

## 2012-06-20 ENCOUNTER — Ambulatory Visit (INDEPENDENT_AMBULATORY_CARE_PROVIDER_SITE_OTHER): Payer: PRIVATE HEALTH INSURANCE | Admitting: Internal Medicine

## 2012-06-20 ENCOUNTER — Encounter: Payer: Self-pay | Admitting: Internal Medicine

## 2012-06-20 VITALS — BP 116/74 | HR 83 | Temp 97.8°F | Wt 281.0 lb

## 2012-06-20 DIAGNOSIS — H9209 Otalgia, unspecified ear: Secondary | ICD-10-CM | POA: Insufficient documentation

## 2012-06-20 MED ORDER — AZELASTINE HCL 0.1 % NA SOLN: 2.0000 | Freq: Two times a day (BID) | NASAL | Status: DC

## 2012-06-20 NOTE — Progress Notes (Signed)
  Subjective:    Patient ID: ELDRA WORD, male    DOB: Dec 30, 1965, 46 y.o.   MRN: [id number]  HPI Acute visit For the last 4 weeks, he is feeling "something" in the right ear, 2 days ago developed some pain, points to the TMJ and the parotid area. Pain is sometimes worse when he chews  Past Medical History:  H/o asthma  chronic granulomatosis (dx at age 22 @ [facility])  borderline DM  Gout  10-07-11: Acute MI, anterior wall, status post drug-eluting stent Dr Eldridge Dace  Past Surgical History:  Cholecystectomy  Family History:  DM-- F side, GF  MI-- F CABG age 109  Social History:  married-- yes  children x 1  tobacco--no  ETOH-- reports heavy use up until 12- 2012   Review of Systems No fever chills No sore throat or URI type of symptoms, no ear discharge per se Denies any dental pain    Objective:   Physical Exam  Constitutional: He appears well-developed. No distress.  HENT:  Head: Normocephalic and atraumatic.    Mouth/Throat: Oropharynx is clear and moist.       Tympanic membranes are not red, ear canals normal, negative trago sign, no tenderness to mastoid percussion. Nose slightly congested Sinuses without tenderness to palpation. Teeth not tender to percussion. TMJ without leak, slightly tender on the right, parotids normal to palpation  Eyes: EOM are normal. Pupils are equal, round, and reactive to light.  Skin: He is not diaphoretic.      Assessment & Plan:

## 2012-06-20 NOTE — Assessment & Plan Note (Addendum)
Right otalgia without evidence of infection, symptoms could be related to his right TMJ, serous otitis (he has some nose congestion). Patient somehow skeptical about a TMJ issue. Plan: Start Astelin (serous otitis ) Encouraged to see his dentist If not better, will call for a ENT referral. Again no obvious evidence of infection, no indication for antibiotics.

## 2012-06-20 NOTE — Patient Instructions (Addendum)
Use nose spray twice a day for at least 2 weeks Please consider see your dentist for the pain around the right ear, TMJ? If not improving, let me know, I could also send you to see a ear nose and throat doctor. Please schedule a routine visit at your convenience

## 2012-07-11 ENCOUNTER — Other Ambulatory Visit: Payer: Self-pay | Admitting: Internal Medicine

## 2012-07-11 NOTE — Telephone Encounter (Signed)
Pt's wife Burna Mortimer calling, pt is out of refills for his albuterol and request refill.

## 2012-07-11 NOTE — Telephone Encounter (Signed)
Refill done.  

## 2013-06-19 ENCOUNTER — Ambulatory Visit (INDEPENDENT_AMBULATORY_CARE_PROVIDER_SITE_OTHER): Payer: BC Managed Care – PPO | Admitting: Internal Medicine

## 2013-06-19 ENCOUNTER — Encounter: Payer: Self-pay | Admitting: Internal Medicine

## 2013-06-19 VITALS — BP 127/81 | HR 91 | Temp 98.7°F | Wt 297.0 lb

## 2013-06-19 DIAGNOSIS — I2510 Atherosclerotic heart disease of native coronary artery without angina pectoris: Secondary | ICD-10-CM

## 2013-06-19 DIAGNOSIS — R7303 Prediabetes: Secondary | ICD-10-CM

## 2013-06-19 DIAGNOSIS — R7309 Other abnormal glucose: Secondary | ICD-10-CM

## 2013-06-19 DIAGNOSIS — J069 Acute upper respiratory infection, unspecified: Secondary | ICD-10-CM

## 2013-06-19 MED ORDER — AMOXICILLIN 500 MG PO CAPS: 1000.0000 mg | ORAL_CAPSULE | Freq: Two times a day (BID) | ORAL | Status: DC

## 2013-06-19 MED ORDER — AZELASTINE HCL 0.1 % NA SOLN: 2.0000 | Freq: Two times a day (BID) | NASAL | Status: DC

## 2013-06-19 NOTE — Progress Notes (Signed)
  Subjective:    Patient ID: Brian Gross, male    DOB: 1965/11/21, 47 y.o.   MRN: [id number]  HPI Acute visit 4 days history of sore throat, some pain in the lymph nodes in neck bilaterally, nose feeling stopped up. On further questioning, he stopped all his heart med x  6-12 months ago b/c he was feeling poorly, "I couldn't breathe", after he discontinue all the medications he felt better. He denies specifically any rash, itching, lip or tongue  Swelling. See assessment and plan. The only medication he is using is albuterol when needed.  Past Medical History:   H/o asthma   chronic granulomatosis (dx at age 56 @ [facility])   borderline DM   Gout   10-07-11: Acute MI, anterior wall, status post drug-eluting stent Dr Eldridge Dace    Past Surgical History:   Cholecystectomy    Family History:   DM-- F side, GF   MI-- F CABG age 91    Social History:   married-- yes   children x 1   tobacco--no   ETOH-- reports heavy use up until 12- 2012   Review of Systems No recent fever, chills, shortness of breath and wheezing. No chest pain. No nausea, vomiting, diarrhea.     Objective:   Physical Exam BP 127/81  Pulse 91  Temp(Src) 98.7 F (37.1 C)  Wt 297 lb (134.718 kg)  BMI 43.84 kg/m2  SpO2 98%  General -- alert, well-developed, NAD.   HEENT-- Not pale. TMs normal, throat symmetric, no redness or discharge. Face symmetric, sinuses not tender to palpation. Nose w/ mild  congested.  Lungs -- normal respiratory effort, no intercostal retractions, no accessory muscle use, and normal breath sounds.  Heart-- normal rate, regular rhythm, no murmur.  Extremities-- no pretibial edema bilaterally  Psych-- Cognition and judgment appear intact. Alert and cooperative with normal attention span and concentration. not anxious appearing and not depressed appearing.      Assessment & Plan:  URI, Symptoms for the last few days most likely URI, no evidence of uncontrolled  asthma at  this point. See instructions.  Today , I spent more than 25 min with the patient, >50% of the time counseling, see CAD

## 2013-06-19 NOTE — Patient Instructions (Addendum)
Rest, fluids , tylenol For cough, take Mucinex DM twice a day as needed  For congestion use astelin nasal spray twice a day until you feel better Take the antibiotic as prescribed  (Amoxicillin) Call if no better in few days Call anytime if the symptoms are severe ---- Start aspirin Come back ASAP for labs: CMP CBC FLP--- dx CAD A1C-- dx diabetes

## 2013-06-19 NOTE — Assessment & Plan Note (Addendum)
The patient stopped Crestor-lisinopril-Coreg-Lasix-potassium-Brilanta > 6 months ago due to difficulty breathing and d/t cost; on further questioning there was apparently no actual allergic reaction or angioedema. I counseled the patient about the risk of having another heart attack or stroke and the importance of taking medication for life, getting his cholesterol, and blood pressure under good control. We agreed that he will come back as soon as he can to get blood work and based on the results he will restart medication. Apparently he has a supply of medicines at home but he simply is not taking them. We had the same discussion about blood sugar. He needs to diet and exercise. Recommend to start aspirin. Patient verbalized understanding.

## 2013-06-19 NOTE — Assessment & Plan Note (Signed)
See CAD 

## 2013-06-20 ENCOUNTER — Other Ambulatory Visit (INDEPENDENT_AMBULATORY_CARE_PROVIDER_SITE_OTHER): Payer: BC Managed Care – PPO

## 2013-06-20 DIAGNOSIS — E119 Type 2 diabetes mellitus without complications: Secondary | ICD-10-CM

## 2013-06-20 DIAGNOSIS — I25810 Atherosclerosis of coronary artery bypass graft(s) without angina pectoris: Secondary | ICD-10-CM

## 2013-06-21 LAB — CBC WITH DIFFERENTIAL/PLATELET
Basophils Absolute: 0 10*3/uL (ref 0.0–0.1)
HCT: 46.7 % (ref 39.0–52.0)
Hemoglobin: 15.5 g/dL (ref 13.0–17.0)
Lymphs Abs: 1.7 10*3/uL (ref 0.7–4.0)
MCHC: 33.3 g/dL (ref 30.0–36.0)
Monocytes Relative: 6.1 % (ref 3.0–12.0)
Neutro Abs: 8.4 10*3/uL — ABNORMAL HIGH (ref 1.4–7.7)
RDW: 14.2 % (ref 11.5–14.6)

## 2013-06-21 LAB — COMPREHENSIVE METABOLIC PANEL
ALT: 24 U/L (ref 0–53)
AST: 23 U/L (ref 0–37)
Alkaline Phosphatase: 86 U/L (ref 39–117)
Creatinine, Ser: 1.4 mg/dL (ref 0.4–1.5)
GFR: 57.85 mL/min — ABNORMAL LOW (ref >60.00)
Total Bilirubin: 0.4 mg/dL (ref 0.3–1.2)

## 2013-06-21 LAB — LIPID PANEL: VLDL: 45.4 mg/dL — ABNORMAL HIGH (ref 0.0–40.0)

## 2013-06-21 LAB — HEMOGLOBIN A1C: Hgb A1c MFr Bld: 5.9 % (ref 4.6–6.5)

## 2013-06-25 ENCOUNTER — Telehealth: Payer: Self-pay | Admitting: *Deleted

## 2013-06-25 NOTE — Telephone Encounter (Signed)
Spoke with pt advised of labs, orders to restart crestor 40mg  q hs. Advised pt to continue maintain healthy diet and get regular exercise. Patient staed that he "had a lot of crestor at home and did ot need a new script". Follow up appt made for 08/27/13 at 0800, pt advised to come fasting.

## 2013-06-29 ENCOUNTER — Telehealth: Payer: Self-pay | Admitting: General Practice

## 2013-06-29 NOTE — Telephone Encounter (Signed)
Spoke with pt's wife at 12:22pm she stated that she had originally called our office at 8:30am no one had responded to her call. No messages on Ext 103 for this pt. Not sure about the 131 ext.   Advised that pt had been stung in the head, pt woke up this morning and had swelling of the eye and face. Advised pt wife to take him to the ER asap due to the allergic reaction. This cannot be treated in our office.

## 2013-08-27 ENCOUNTER — Ambulatory Visit: Payer: BC Managed Care – PPO | Admitting: Internal Medicine

## 2014-02-19 ENCOUNTER — Other Ambulatory Visit: Payer: Self-pay | Admitting: *Deleted

## 2014-02-19 ENCOUNTER — Telehealth: Payer: Self-pay | Admitting: *Deleted

## 2014-02-19 MED ORDER — CARVEDILOL 12.5 MG PO TABS: 12.5000 mg | ORAL_TABLET | Freq: Two times a day (BID) | ORAL | Status: DC

## 2014-02-19 MED ORDER — FUROSEMIDE 20 MG PO TABS: 20.0000 mg | ORAL_TABLET | Freq: Every day | ORAL | Status: DC

## 2014-02-19 MED ORDER — TICAGRELOR 90 MG PO TABS: 90.0000 mg | ORAL_TABLET | Freq: Two times a day (BID) | ORAL | Status: DC

## 2014-02-19 NOTE — Telephone Encounter (Signed)
Left message for pt to call back to discuss refill requests received from pharmacy.  Pt has not been seen here since 01/2012.  These medications are not on his medication list and I am not sure that he has been taking them.  He needs to be scheduled to be seen.  meds requested are Brilinta 90 mg 1 po BID, carvedilol 12.5 mg 1 po BID and Lasix 40 mg 1 [facility] on [address]

## 2014-02-19 NOTE — Telephone Encounter (Signed)
Patient returned Pam's call. I discussed the brilinta, carvedilol and the lasix with her. She stated that the patient was given these meds back in 2012 in the hospital by an [facility] physician. They lost their insurance so that is why he has not been in for so long, but they now have their coverage back and are willing to make an appointment. Transferred to scheduling and an appointment was made for 02/28/14 so I sent in a 10 day supply to get him through until then. Wife aware that he must keep appointment for further refills.

## 2014-02-19 NOTE — Telephone Encounter (Signed)
Noted and appt has been scheduled.

## 2014-02-28 ENCOUNTER — Ambulatory Visit (INDEPENDENT_AMBULATORY_CARE_PROVIDER_SITE_OTHER): Payer: BC Managed Care – PPO | Admitting: Cardiology

## 2014-02-28 ENCOUNTER — Encounter: Payer: Self-pay | Admitting: Cardiology

## 2014-02-28 VITALS — BP 130/84 | HR 70 | Ht 69.0 in | Wt 282.4 lb

## 2014-02-28 DIAGNOSIS — I2510 Atherosclerotic heart disease of native coronary artery without angina pectoris: Secondary | ICD-10-CM

## 2014-02-28 DIAGNOSIS — I255 Ischemic cardiomyopathy: Secondary | ICD-10-CM

## 2014-02-28 DIAGNOSIS — I2589 Other forms of chronic ischemic heart disease: Secondary | ICD-10-CM

## 2014-02-28 DIAGNOSIS — E7800 Pure hypercholesterolemia, unspecified: Secondary | ICD-10-CM

## 2014-02-28 DIAGNOSIS — Z79899 Other long term (current) drug therapy: Secondary | ICD-10-CM

## 2014-02-28 LAB — BASIC METABOLIC PANEL
BUN: 19 mg/dL (ref 6–23)
CALCIUM: 9.5 mg/dL (ref 8.4–10.5)
CHLORIDE: 103 meq/L (ref 96–112)
CO2: 28 meq/L (ref 19–32)
Creatinine, Ser: 1.5 mg/dL (ref 0.4–1.5)
GFR: 55.4 mL/min — ABNORMAL LOW (ref >60.00)
Glucose, Bld: 93 mg/dL (ref 70–99)
Potassium: 4.7 mEq/L (ref 3.5–5.1)
SODIUM: 138 meq/L (ref 135–145)

## 2014-02-28 MED ORDER — ROSUVASTATIN CALCIUM 20 MG PO TABS: 20.0000 mg | ORAL_TABLET | Freq: Every day | ORAL | Status: DC

## 2014-02-28 MED ORDER — LISINOPRIL 2.5 MG PO TABS: 2.5000 mg | ORAL_TABLET | Freq: Every day | ORAL | Status: DC

## 2014-02-28 MED ORDER — FUROSEMIDE 20 MG PO TABS: 20.0000 mg | ORAL_TABLET | Freq: Every day | ORAL | Status: DC

## 2014-02-28 MED ORDER — CARVEDILOL 12.5 MG PO TABS: 12.5000 mg | ORAL_TABLET | Freq: Two times a day (BID) | ORAL | Status: DC

## 2014-02-28 NOTE — Progress Notes (Signed)
HPI The patient presents as a new patient. I did see him many years ago to evaluate atypical chest pain. He had a negative stress perfusion study in 2008. Unfortunately he presented in late December of last year with an anterior myocardial infarction. Sounds like this was a late presentation. He did have an occluded LAD which was treated with a drug-eluting stent. There was also RCA occlusion which was apparently chronic.  He has been gone from follow up for two years.  He stopped taking his medications and only returns because now he has insurance again and thinks he wants to restart. He actually used to be lightheaded he said when he took his medications. He has a very limited understanding of what he was taking and why. He did gain a lot of weight but started eating a little bit better recently. He was having some dyspnea but notices that this is improved as he loses weight. He denies any chest pressure, neck or arm discomfort. He's had no new palpitations, presyncope or syncope. He denies any PND or orthopnea.  Allergies  Allergen Reactions  . Morphine     Reaction unknown    Current Outpatient Prescriptions  Medication Sig Dispense Refill  . aspirin 81 MG tablet Take 81 mg by mouth daily.      . carvedilol (COREG) 12.5 MG tablet Take 1 tablet (12.5 mg total) by mouth 2 (two) times daily.  20 tablet  0  . furosemide (LASIX) 20 MG tablet Take 1 tablet (20 mg total) by mouth daily.  10 tablet  0  . ticagrelor (BRILINTA) 90 MG TABS tablet Take 1 tablet (90 mg total) by mouth 2 (two) times daily.  20 tablet  0  . VENTOLIN HFA 108 (90 BASE) MCG/ACT inhaler INHALE TWO PUFFS BY MOUTH EVERY 6 HOURS AS NEEDED FOR WHEEZING  18 g  2   No current facility-administered medications for this visit.    Past Medical History  Diagnosis Date  . Granulomatosis     chronic granulomatosis (dx at age 48 @ [facility])  . Gout   . Borderline diabetes   . Asthma   . CHF (congestive heart failure)   .  Chest pain   . SOB (shortness of breath)   . MI (myocardial infarction)   . CAD (coronary artery disease)     Anterior wall myocardial infarction December 2012  . Obesity     Past Surgical History  Procedure Laterality Date  . Cholecystectomy    . Lung biopsy  1990s   ROS:  As stated in the HPI and negative for all other systems.  PHYSICAL EXAM BP 130/84  Pulse 70  Ht 5\' 9"  (1.753 m)  Wt 282 lb 6.4 oz (128.096 kg)  BMI 41.68 kg/m2 GENERAL:  Well appearing HEENT:  Pupils equal round and reactive, fundi not visualized, oral mucosa unremarkable NECK:  No jugular venous distention, waveform within normal limits, carotid upstroke brisk and symmetric, no bruits, no thyromegaly LYMPHATICS:  No cervical, inguinal adenopathy LUNGS:  Clear to auscultation bilaterally BACK:  No CVA tenderness CHEST:  Unremarkable HEART:  PMI not displaced or sustained,S1 and S2 within normal limits, no S3, no S4, no clicks, no rubs, no murmurs ABD:  Flat, positive bowel sounds normal in frequency in pitch, no bruits, no rebound, no guarding, no midline pulsatile mass, no hepatomegaly, no splenomegaly, obese EXT:  2 plus pulses throughout, no edema, no cyanosis no clubbing  EKG:  Sinus rhythm, rate 70, low-voltage  in limb leads, old inferior infarct, old anteroseptal infarct, no change from previous. 02/28/2014  ASSESSMENT AND PLAN  CAD:  The patient has not had any ongoing symptoms.  At this point we will continue with risk reduction. He's not been participating in any of this. We had a long discussion about this. I will be checking an echocardiogram.  ISCHEMIC CARDIOMYOPATHY:  I will check the an echo as above.  He claims to be taking his beta blocker. I will restart a low dose of ACE inhibitor. He will continue his current diuretic. He's not taking the potassium and I will check a basic metabolic profile today. He can stay off of this for now.  DYSLIPIDEMIA:  I did review the lipids done in September. I  will restart Crestor 20 mg and repeat a lipid and liver in 8 weeks.

## 2014-02-28 NOTE — Patient Instructions (Addendum)
Please take Lisinopril 2.5 mg once a day, take Crestor 20 mg a day. Do not take potassium chloride. Continue all other medications as listed.  Please have blood work today (BMP)  Return in 8 weeks for a fasting lipid and liver profile.  Do not eat or drink after midnight the night before this blood work.  Your physician has requested that you have an echocardiogram. Echocardiography is a painless test that uses sound waves to create images of your heart. It provides your doctor with information about the size and shape of your heart and how well your heart's chambers and valves are working. This procedure takes approximately one hour. There are no restrictions for this procedure.  Follow up in 6 months with Dr Antoine PocheHochrein.  You will receive a letter in the mail 2 months before you are due.  Please call us when you receive this letter to schedule your follow up appointment.

## 2014-03-15 ENCOUNTER — Telehealth: Payer: Self-pay

## 2014-03-15 NOTE — Telephone Encounter (Signed)
Patient 's wife called for a copay card for crestor

## 2014-04-25 ENCOUNTER — Other Ambulatory Visit (HOSPITAL_COMMUNITY): Payer: BC Managed Care – PPO

## 2014-04-25 ENCOUNTER — Other Ambulatory Visit: Payer: BC Managed Care – PPO

## 2014-09-25 ENCOUNTER — Encounter (HOSPITAL_COMMUNITY): Payer: Self-pay | Admitting: Interventional Cardiology

## 2015-03-04 ENCOUNTER — Other Ambulatory Visit: Payer: Self-pay

## 2015-03-04 MED ORDER — CARVEDILOL 12.5 MG PO TABS: 12.5000 mg | ORAL_TABLET | Freq: Two times a day (BID) | ORAL | Status: DC

## 2015-03-04 MED ORDER — LISINOPRIL 2.5 MG PO TABS: 2.5000 mg | ORAL_TABLET | Freq: Every day | ORAL | Status: DC

## 2015-03-04 MED ORDER — FUROSEMIDE 20 MG PO TABS: 20.0000 mg | ORAL_TABLET | Freq: Every day | ORAL | Status: DC

## 2015-03-04 MED ORDER — ROSUVASTATIN CALCIUM 20 MG PO TABS: 20.0000 mg | ORAL_TABLET | Freq: Every day | ORAL | Status: DC

## 2015-05-12 ENCOUNTER — Other Ambulatory Visit: Payer: Self-pay | Admitting: Cardiology

## 2015-05-13 NOTE — Telephone Encounter (Signed)
Rx(s) sent to pharmacy electronically.  

## 2015-06-18 ENCOUNTER — Other Ambulatory Visit: Payer: Self-pay | Admitting: Cardiology

## 2015-06-25 ENCOUNTER — Telehealth: Payer: Self-pay | Admitting: Cardiology

## 2015-06-25 MED ORDER — ROSUVASTATIN CALCIUM 20 MG PO TABS
20.0000 mg | ORAL_TABLET | Freq: Every day | ORAL | Status: DC
Start: 2015-06-25 — End: 2015-09-02

## 2015-06-25 NOTE — Telephone Encounter (Signed)
No answer/No voicemail. Refill of Crestor 20 mg called in for patient. Verified that he has scheduled a f/u appt with Dr. Antoine Poche for October.

## 2015-06-25 NOTE — Telephone Encounter (Signed)
°  1. Which medications need to be refilled? Crestor   2. Which pharmacy is medication to be sent to?[facility] on  [address]  3. Do they need a 30 day or 90 day supply? 30  4. Would they like a call back once the medication has been sent to the pharmacy? yes

## 2015-07-09 ENCOUNTER — Other Ambulatory Visit: Payer: Self-pay | Admitting: Cardiology

## 2015-07-27 ENCOUNTER — Other Ambulatory Visit: Payer: Self-pay | Admitting: Cardiology

## 2015-07-28 NOTE — Telephone Encounter (Signed)
REFILL 

## 2015-08-01 ENCOUNTER — Other Ambulatory Visit: Payer: Self-pay | Admitting: Cardiology

## 2015-08-15 ENCOUNTER — Ambulatory Visit (INDEPENDENT_AMBULATORY_CARE_PROVIDER_SITE_OTHER): Payer: Self-pay | Admitting: Cardiology

## 2015-08-15 ENCOUNTER — Encounter: Payer: Self-pay | Admitting: Cardiology

## 2015-08-15 VITALS — BP 140/82 | HR 72 | Ht 69.0 in | Wt 284.0 lb

## 2015-08-15 DIAGNOSIS — I255 Ischemic cardiomyopathy: Secondary | ICD-10-CM

## 2015-08-15 DIAGNOSIS — E785 Hyperlipidemia, unspecified: Secondary | ICD-10-CM

## 2015-08-15 DIAGNOSIS — Z79899 Other long term (current) drug therapy: Secondary | ICD-10-CM

## 2015-08-15 LAB — LIPID PANEL
Cholesterol: 163 mg/dL (ref 125–200)
HDL: 44 mg/dL (ref >=40)
LDL CALC: 77 mg/dL (ref <130)
TRIGLYCERIDES: 208 mg/dL — AB (ref <150)
Total CHOL/HDL Ratio: 3.7 Ratio (ref <=5.0)
VLDL: 42 mg/dL — AB (ref <30)

## 2015-08-15 LAB — COMPLETE METABOLIC PANEL WITH GFR
ALT: 37 U/L (ref 9–46)
AST: 27 U/L (ref 10–40)
Albumin: 3.9 g/dL (ref 3.6–5.1)
Alkaline Phosphatase: 117 U/L — ABNORMAL HIGH (ref 40–115)
BILIRUBIN TOTAL: 0.5 mg/dL (ref 0.2–1.2)
BUN: 25 mg/dL (ref 7–25)
CHLORIDE: 97 mmol/L — AB (ref 98–110)
CO2: 29 mmol/L (ref 20–31)
CREATININE: 1.23 mg/dL (ref 0.60–1.35)
Calcium: 9.1 mg/dL (ref 8.6–10.3)
GFR, EST AFRICAN AMERICAN: 81 mL/min (ref >=60)
GFR, Est Non African American: 70 mL/min (ref >=60)
GLUCOSE: 101 mg/dL — AB (ref 65–99)
Potassium: 4.7 mmol/L (ref 3.5–5.3)
SODIUM: 136 mmol/L (ref 135–146)
TOTAL PROTEIN: 7.1 g/dL (ref 6.1–8.1)

## 2015-08-15 MED ORDER — LISINOPRIL 5 MG PO TABS: 5.0000 mg | ORAL_TABLET | Freq: Every day | ORAL | Status: DC

## 2015-08-15 NOTE — Progress Notes (Signed)
HPI The patient presents for evaluation of CAD. He had a negative stress perfusion study2008. Unfortunately he presented in late December of last year with an anterior myocardial infarction. This was a late presentation. He did have an occluded LAD which was treated with a drug-eluting stent. There was also RCA occlusion which was apparently chronic.  He did not come back for follow-up for about 2 years until he got some insurance. He stopped his medication. He then came back in  2015. I tried to get an ultrasound but he could not afford this.   He returns for follow-up. He actually works hard with physical job. The patient denies any new symptoms such as chest discomfort, neck or arm discomfort. There has been no new shortness of breath, PND or orthopnea. There have been no reported palpitations, presyncope or syncope.    Allergies  Allergen Reactions  . Morphine     Reaction unknown    Current Outpatient Prescriptions  Medication Sig Dispense Refill  . aspirin 81 MG tablet Take 81 mg by mouth daily.    . carvedilol (COREG) 12.5 MG tablet TAKE ONE TABLET BY MOUTH TWICE DAILY 60 tablet 0  . furosemide (LASIX) 20 MG tablet TAKE ONE TABLET BY MOUTH ONCE DAILY (PATIENT  NEEDS  TO  KEEP  APPOINTMENT  ON  08/15/15  FOR  FURTHER  REFILLS) 30 tablet 0  . lisinopril (PRINIVIL,ZESTRIL) 2.5 MG tablet Take 1 tablet (2.5 mg total) by mouth daily. 30 tablet 0  . rosuvastatin (CRESTOR) 20 MG tablet Take 1 tablet (20 mg total) by mouth daily. 30 tablet 1  . VENTOLIN HFA 108 (90 BASE) MCG/ACT inhaler INHALE TWO PUFFS BY MOUTH EVERY 6 HOURS AS NEEDED FOR WHEEZING 18 g 2   No current facility-administered medications for this visit.    Past Medical History  Diagnosis Date  . Granulomatosis     chronic granulomatosis (dx at age 49 @ [facility])  . Gout   . Borderline diabetes   . Asthma   . CHF (congestive heart failure) (HCC)   . Chest pain   . SOB (shortness of breath)   . MI (myocardial  infarction) (HCC)   . CAD (coronary artery disease)     Anterior wall myocardial infarction December 2012  . Obesity     Past Surgical History  Procedure Laterality Date  . Cholecystectomy    . Lung biopsy  1990s  . Left heart catheterization with coronary angiogram N/A 10/18/2011    Procedure: LEFT HEART CATHETERIZATION WITH CORONARY ANGIOGRAM;  Surgeon: Corky CraftsJayadeep S Varanasi, MD;  [facility];  Service: Cardiovascular;  Laterality: N/A;  . Percutaneous coronary stent intervention (pci-s) N/A 10/18/2011    Procedure: PERCUTANEOUS CORONARY STENT INTERVENTION (PCI-S);  Surgeon: Corky CraftsJayadeep S Varanasi, MD;  [facility];  Service: Cardiovascular;  Laterality: N/A;   ROS:  As stated in the HPI and negative for all other systems.  PHYSICAL EXAM BP 140/82 mmHg  Pulse 72  Ht 5\' 9"  (1.753 m)  Wt 284 lb (128.822 kg)  BMI 41.92 kg/m2 GENERAL:  Well appearing HEENT:  Pupils equal round and reactive, fundi not visualized, oral mucosa unremarkable NECK:  No jugular venous distention, waveform within normal limits, carotid upstroke brisk and symmetric, no bruits, no thyromegaly LYMPHATICS:  No cervical, inguinal adenopathy LUNGS:  Clear to auscultation bilaterally BACK:  No CVA tenderness CHEST:  Unremarkable HEART:  PMI not displaced or sustained,S1 and S2 within normal limits, no S3, no S4,  no clicks, no rubs, no murmurs ABD:  Flat, positive bowel sounds normal in frequency in pitch, no bruits, no rebound, no guarding, no midline pulsatile mass, no hepatomegaly, no splenomegaly, obese EXT:  2 plus pulses throughout, no edema, no cyanosis no clubbing  EKG:  Sinus rhythm, rate 72, low-voltage in limb leads, old inferior infarct, old anteroseptal infarct, no change from previous. 08/15/2015  ASSESSMENT AND PLAN  CAD:  The patient has not had any ongoing symptoms.  At this point we will continue with risk reduction.  I am not planning any stress testing.  However, he needs  aggressive lifestyle changes.   He remains at high risk for furture cardiovascular events.    ISCHEMIC CARDIOMYOPATHY:   He absolutely needs an echo.  We will work with the billing dempartment to make sure that this happens.  I will increase his lisinopril to 5 mg daily.    DYSLIPIDEMIA:  I will check a lipid profile and CMET.   OBESITY:  The patient understands the need to lose weight with diet and exercise. We have discussed specific strategies for this.

## 2015-08-15 NOTE — Patient Instructions (Signed)
Your physician wants you to follow-up in: 6 Months. You will receive a reminder letter in the mail two months in advance. If you don't receive a letter, please call our office to schedule the follow-up appointment.  Your physician has requested that you have an echocardiogram. Echocardiography is a painless test that uses sound waves to create images of your heart. It provides your doctor with information about the size and shape of your heart and how well your heart's chambers and valves are working. This procedure takes approximately one hour. There are no restrictions for this procedure.  Your physician has recommended you make the following change in your medication: Increase Lisinopril 5 mg daily  Your physician recommends that you return for lab work in: Today, CMP and Fasting Lipids

## 2015-08-29 ENCOUNTER — Other Ambulatory Visit: Payer: Self-pay | Admitting: Cardiology

## 2015-09-02 ENCOUNTER — Other Ambulatory Visit: Payer: Self-pay | Admitting: Cardiology

## 2015-09-10 ENCOUNTER — Encounter: Payer: Self-pay | Admitting: *Deleted

## 2015-11-04 ENCOUNTER — Telehealth: Payer: Self-pay

## 2015-11-04 NOTE — Telephone Encounter (Signed)
LEFT VOICEMAIL FOR PATIENT TO RETURN CALL .Marland KitchenNEEDING TO VERIFY INSURANCE DAJ

## 2015-11-07 ENCOUNTER — Other Ambulatory Visit (HOSPITAL_COMMUNITY): Payer: Self-pay

## 2015-11-12 ENCOUNTER — Telehealth: Payer: Self-pay

## 2015-11-14 ENCOUNTER — Telehealth: Payer: Self-pay | Admitting: Internal Medicine

## 2015-11-14 NOTE — Telephone Encounter (Signed)
No Voicemail to inquire if flu shot was given

## 2015-12-12 ENCOUNTER — Other Ambulatory Visit (HOSPITAL_COMMUNITY): Payer: Self-pay

## 2015-12-16 ENCOUNTER — Telehealth: Payer: Self-pay | Admitting: Internal Medicine

## 2015-12-16 NOTE — Telephone Encounter (Signed)
Tried contacting the pt regarding flu shot, pt does not have VM capibility.

## 2016-04-16 ENCOUNTER — Other Ambulatory Visit: Payer: Self-pay

## 2016-04-16 MED ORDER — ROSUVASTATIN CALCIUM 20 MG PO TABS: 20.0000 mg | ORAL_TABLET | Freq: Every day | ORAL | Status: DC

## 2016-05-05 ENCOUNTER — Other Ambulatory Visit: Payer: Self-pay | Admitting: *Deleted

## 2016-05-06 ENCOUNTER — Telehealth: Payer: Self-pay | Admitting: Cardiology

## 2016-05-06 DIAGNOSIS — E785 Hyperlipidemia, unspecified: Secondary | ICD-10-CM

## 2016-05-06 MED ORDER — ROSUVASTATIN CALCIUM 20 MG PO TABS: 20.0000 mg | ORAL_TABLET | Freq: Every day | ORAL | Status: DC

## 2016-05-06 NOTE — Telephone Encounter (Signed)
Patient called no answer.Unable to leave a message prescription sent to pharmacy.No voice mail.

## 2016-05-06 NOTE — Telephone Encounter (Signed)
Prescription sent to pharmacy-[facility] on [address].  Attempted to call wife to inform-no answer unable to leave message.

## 2016-05-06 NOTE — Telephone Encounter (Signed)
New message       *STAT* If patient is at the pharmacy, call can be transferred to refill team.   1. Which medications need to be refilled? (please list name of each medication and dose if known) generic crestor 2. Which pharmacy/location (including street and city if local pharmacy) is medication to be sent to? [email or web address]  3. Do they need a 30 day or 90 day supply? 30 day------pt has an appt on 06-10-16.  He has been out of rx for 2 wks.  Please call wife and let her know rx has been called in.

## 2016-05-27 ENCOUNTER — Encounter: Payer: Self-pay | Admitting: Cardiology

## 2016-06-02 ENCOUNTER — Encounter: Payer: Self-pay | Admitting: Cardiology

## 2016-06-02 ENCOUNTER — Telehealth: Payer: Self-pay | Admitting: Cardiology

## 2016-06-02 NOTE — Telephone Encounter (Signed)
Closed encounter °

## 2016-06-10 ENCOUNTER — Ambulatory Visit: Payer: Self-pay | Admitting: Cardiology

## 2016-07-07 NOTE — Progress Notes (Signed)
HPI The patient presents for evaluation of CAD. He had a negative stress perfusion study 2008. Unfortunately he presented in late December of last year with an anterior myocardial infarction. This was a late presentation. He did have an occluded LAD which was treated with a drug-eluting stent. There was also RCA occlusion which was apparently chronic.  He did not come back for follow-up for about 2 years until he got some insurance. He stopped his medication. He then came back in  2015. I tried to get an ultrasound but he could not afford this.  He comes back today.  He has only been taking his Coreg once daily and he has been cutting his lisinopril in half.  He has been eating more and is now above 300 lbs.  The patient denies any new symptoms such as chest discomfort, neck or arm discomfort. There has been no new shortness of breath, PND or orthopnea. There have been no reported palpitations, presyncope or syncope.  He has had a dry cough.   Allergies  Allergen Reactions  . Morphine     Reaction unknown    Current Outpatient Prescriptions  Medication Sig Dispense Refill  . albuterol (VENTOLIN HFA) 108 (90 Base) MCG/ACT inhaler Inhale 1-2 puffs into the lungs every 6 (six) hours as needed for wheezing or shortness of breath. 18 g 2  . aspirin 81 MG tablet Take 81 mg by mouth daily.    . carvedilol (COREG) 12.5 MG tablet Take 1 tablet (12.5 mg total) by mouth 2 (two) times daily. 60 tablet 10  . furosemide (LASIX) 20 MG tablet Take 1 tablet (20 mg total) by mouth daily. 30 tablet 10  . losartan (COZAAR) 25 MG tablet Take 1 tablet (25 mg total) by mouth daily. 30 tablet 10  . rosuvastatin (CRESTOR) 20 MG tablet Take 1 tablet (20 mg total) by mouth daily. 30 tablet 3   No current facility-administered medications for this visit.     Past Medical History:  Diagnosis Date  . Asthma   . Borderline diabetes   . CAD (coronary artery disease)    Anterior wall myocardial infarction December 2012   . Chest pain   . CHF (congestive heart failure) (HCC)   . Gout   . Granulomatosis    chronic granulomatosis (dx at age 77 @ [facility])  . MI (myocardial infarction) (HCC)   . Obesity   . SOB (shortness of breath)     Past Surgical History:  Procedure Laterality Date  . CHOLECYSTECTOMY    . LEFT HEART CATHETERIZATION WITH CORONARY ANGIOGRAM N/A 10/18/2011   Procedure: LEFT HEART CATHETERIZATION WITH CORONARY ANGIOGRAM;  Surgeon: Corky Crafts, MD;  Location: [facility] CATH LAB;  Service: Cardiovascular;  Laterality: N/A;  . LUNG BIOPSY  1990s  . PERCUTANEOUS CORONARY STENT INTERVENTION (PCI-S) N/A 10/18/2011   Procedure: PERCUTANEOUS CORONARY STENT INTERVENTION (PCI-S);  Surgeon: Corky Crafts, MD;  Location: [facility] LAB;  Service: Cardiovascular;  Laterality: N/A;   ROS:    As stated in the HPI and negative for all other systems.  PHYSICAL EXAM BP 110/80 (BP Location: Right Arm, Patient Position: Sitting, Cuff Size: Large)   Pulse 60   Ht 5\' 9"  (1.753 m)   Wt (!) 303 lb 3.2 oz (137.5 kg)   SpO2 95%   BMI 44.77 kg/m  GENERAL:  Well appearing NECK:  No jugular venous distention, waveform within normal limits, carotid upstroke brisk and symmetric, no bruits, no thyromegaly LYMPHATICS:  No  cervical, inguinal adenopathy LUNGS:  Clear to auscultation bilaterally CHEST:  Unremarkable HEART:  PMI not displaced or sustained,S1 and S2 within normal limits, no S3, no S4, no clicks, no rubs, no murmurs ABD:  Flat, positive bowel sounds normal in frequency in pitch, no bruits, no rebound, no guarding, no midline pulsatile mass, no hepatomegaly, no splenomegaly, obese EXT:  2 plus pulses throughout, trace edema, no cyanosis no clubbing  EKG:  Sinus rhythm, rate 60, low-voltage in limb leads, old inferior infarct, old anteroseptal infarct, no change from previous. 07/08/2016  ASSESSMENT AND PLAN  CAD:  The patient has not had any ongoing symptoms.  At this point we will continue  with risk reduction.  I am not planning any stress testing.  However, he needs aggressive lifestyle changes.   He remains at high risk for furture cardiovascular events.    ISCHEMIC CARDIOMYOPATHY:   He has not wanted an echocardiogram secondary to cost.    He is coughing on lisinopril and so I will change to Cozaar 25 mg daily.    DYSLIPIDEMIA:  His last LDL was 77 and HDL 44.  He will continue the meds as listed.   OBESITY:  The patient understands the need to lose weight with diet and exercise. We have discussed specific strategies for this.

## 2016-07-08 ENCOUNTER — Ambulatory Visit (INDEPENDENT_AMBULATORY_CARE_PROVIDER_SITE_OTHER): Payer: BLUE CROSS/BLUE SHIELD | Admitting: Cardiology

## 2016-07-08 ENCOUNTER — Encounter: Payer: Self-pay | Admitting: Cardiology

## 2016-07-08 VITALS — BP 110/80 | HR 60 | Ht 69.0 in | Wt 303.2 lb

## 2016-07-08 DIAGNOSIS — E785 Hyperlipidemia, unspecified: Secondary | ICD-10-CM

## 2016-07-08 DIAGNOSIS — I255 Ischemic cardiomyopathy: Secondary | ICD-10-CM

## 2016-07-08 MED ORDER — ALBUTEROL SULFATE HFA 108 (90 BASE) MCG/ACT IN AERS: 1.0000 | INHALATION_SPRAY | Freq: Four times a day (QID) | RESPIRATORY_TRACT | 2 refills | Status: AC | PRN

## 2016-07-08 MED ORDER — CARVEDILOL 12.5 MG PO TABS: 12.5000 mg | ORAL_TABLET | Freq: Two times a day (BID) | ORAL | 10 refills | Status: DC

## 2016-07-08 MED ORDER — LOSARTAN POTASSIUM 25 MG PO TABS: 25.0000 mg | ORAL_TABLET | Freq: Every day | ORAL | 10 refills | Status: DC

## 2016-07-08 NOTE — Patient Instructions (Signed)
Medication Instructions:  STOP- Lisinopril and START Losartan 25 mg daily, TAKE Carvedilol twice a day   Labwork: None Ordered  Testing/Procedures: None Ordered  Follow-Up: Your physician wants you to follow-up in: 6 Months. You will receive a reminder letter in the mail two months in advance. If you don't receive a letter, please call our office to schedule the follow-up appointment.   Any Other Special Instructions Will Be Listed Below (If Applicable).   If you need a refill on your cardiac medications before your next appointment, please call your pharmacy.

## 2016-08-09 ENCOUNTER — Other Ambulatory Visit: Payer: Self-pay

## 2016-08-09 MED ORDER — ROSUVASTATIN CALCIUM 20 MG PO TABS: 20.0000 mg | ORAL_TABLET | Freq: Every day | ORAL | 1 refills | Status: DC

## 2016-08-09 MED ORDER — FUROSEMIDE 20 MG PO TABS: 20.0000 mg | ORAL_TABLET | Freq: Every day | ORAL | 1 refills | Status: DC

## 2017-02-14 ENCOUNTER — Other Ambulatory Visit: Payer: Self-pay | Admitting: Cardiology

## 2017-02-14 NOTE — Telephone Encounter (Signed)
REFILL 

## 2017-03-09 ENCOUNTER — Ambulatory Visit (INDEPENDENT_AMBULATORY_CARE_PROVIDER_SITE_OTHER): Payer: BLUE CROSS/BLUE SHIELD | Admitting: Student

## 2017-03-09 ENCOUNTER — Telehealth: Payer: Self-pay | Admitting: Internal Medicine

## 2017-03-09 ENCOUNTER — Telehealth: Payer: Self-pay | Admitting: Cardiology

## 2017-03-09 ENCOUNTER — Encounter: Payer: Self-pay | Admitting: Student

## 2017-03-09 VITALS — BP 114/78 | HR 89 | Ht 69.0 in | Wt 263.0 lb

## 2017-03-09 DIAGNOSIS — R110 Nausea: Secondary | ICD-10-CM

## 2017-03-09 DIAGNOSIS — R0600 Dyspnea, unspecified: Secondary | ICD-10-CM

## 2017-03-09 DIAGNOSIS — I25119 Atherosclerotic heart disease of native coronary artery with unspecified angina pectoris: Secondary | ICD-10-CM | POA: Diagnosis not present

## 2017-03-09 DIAGNOSIS — I10 Essential (primary) hypertension: Secondary | ICD-10-CM | POA: Diagnosis not present

## 2017-03-09 DIAGNOSIS — I255 Ischemic cardiomyopathy: Secondary | ICD-10-CM | POA: Diagnosis not present

## 2017-03-09 DIAGNOSIS — R0609 Other forms of dyspnea: Secondary | ICD-10-CM | POA: Diagnosis not present

## 2017-03-09 LAB — BRAIN NATRIURETIC PEPTIDE: Brain Natriuretic Peptide: 42.2 pg/mL (ref <100)

## 2017-03-09 LAB — D-DIMER, QUANTITATIVE: D-Dimer, Quant: 0.34 mcg/mL FEU (ref <0.50)

## 2017-03-09 LAB — TROPONIN I: Troponin I: <0.01 ng/mL (ref <=0.05)

## 2017-03-09 MED ORDER — NITROGLYCERIN 0.4 MG SL SUBL: 0.4000 mg | SUBLINGUAL_TABLET | SUBLINGUAL | 3 refills | Status: AC | PRN

## 2017-03-09 NOTE — Progress Notes (Signed)
Cardiology Office Note    Date:  03/09/2017   ID:  Brian Gross, DOB 06-03-1966, MRN [id number]  PCP:  Wanda Plump, MD  Cardiologist: Dr. Antoine Poche   Chief Complaint  Patient presents with  . Shortness of Breath  . Fatigue    History of Present Illness:    Brian Gross is a 51 y.o. male with past medical history of CAD (s/p anterior STEMI in 09/2011 with aspiration of occluded LAD with DES placed at that time, CTO of RCA), ischemic cardiomyopathy (EF at 30% during time of cath in 09/2011), HTN, HLD, and obesity who presents to the office today as an add-on visit for dyspnea.   He was last examined by Dr. Ivette Loyal on in 06/2016 and reported only taking his carvedilol once daily and cutting his lisinopril and half due to running out of the medication. He denied any recent symptoms of chest discomfort or dyspnea on exertion. Previous echocardiograms have been ordered but he declined to have these performed due to the cost. He did report having a dry cough on lisinopril therefore this was changed to Cozaar 25 mg daily.  He called the office earlier today reporting dyspnea on exertion with associated diaphoresis. He denied any recurrent chest discomfort, therefore he was added-on for an acute visit.   In talking with the patient today, he reports acute dyspnea on exertion starting yesterday morning. He had been working around mold earlier in the day and is curious if this contributed to his symptoms. Used his Albuterol inhaler with no improvement in his symptoms. He reports the dyspnea has continued throughout today and is mostly occurring with exertion and is relieved with rest. He denies any associated chest discomfort, palpitations, lightheadedness, dizziness, or presyncope. Reports having frequent nausea. He uses Marijuana daily but has not used this for the past 3 days due to a decreased appetite.   Says his dyspnea on exertion is not as significant as it was in 2012 at the time of his MI.  He had stabbing chest pain at that time and denies any pain at this time. Does report "heartburn" but says this is different from his prior anginal symptoms.   He has experienced episodes of calf pain and notes frequent travel over the past two weeks, driving 3+ hours to go fishing.   Also notes orthopnea and PND. No lower extremity edema. He has refused echocardiograms in the past due to this not being covered by his insurance.   Past Medical History:  Diagnosis Date  . Asthma   . Borderline diabetes   . CAD (coronary artery disease)    a. s/p anterior STEMI in 09/2011 with aspiration of occluded LAD with DES placed at that time, CTO of RCA noted. EF at 30%  . Chest pain   . CHF (congestive heart failure) (HCC)   . Gout   . Granulomatosis    chronic granulomatosis (dx at age 51 @ [facility])  . Ischemic cardiomyopathy    a. EF at 30% by cath in 09/2011. Has refused echos in the past.   . MI (myocardial infarction) (HCC)   . Obesity   . SOB (shortness of breath)     Past Surgical History:  Procedure Laterality Date  . CHOLECYSTECTOMY    . LEFT HEART CATHETERIZATION WITH CORONARY ANGIOGRAM N/A 10/18/2011   Procedure: LEFT HEART CATHETERIZATION WITH CORONARY ANGIOGRAM;  Surgeon: Corky Crafts, MD;  Location: [facility] CATH LAB;  Service: Cardiovascular;  Laterality: N/A;  .  LUNG BIOPSY  1990s  . PERCUTANEOUS CORONARY STENT INTERVENTION (PCI-S) N/A 10/18/2011   Procedure: PERCUTANEOUS CORONARY STENT INTERVENTION (PCI-S);  Surgeon: Corky CraftsJayadeep S Varanasi, MD;  [facility] C[facility];  Service: Cardiovascular;  Laterality: N/A;    Current Medications: Outpatient Medications Prior to Visit  Medication Sig Dispense Refill  . albuterol (VENTOLIN HFA) 108 (90 Base) MCG/ACT inhaler Inhale 1-2 puffs into the lungs every 6 (six) hours as needed for wheezing or shortness of breath. 18 g 2  . aspirin 81 MG tablet Take 81 mg by mouth daily.    . carvedilol (COREG) 12.5 MG tablet Take 1 tablet  (12.5 mg total) by mouth 2 (two) times daily. 60 tablet 10  . furosemide (LASIX) 20 MG tablet TAKE 1 TABLET EVERY DAY 90 tablet 0  . rosuvastatin (CRESTOR) 20 MG tablet TAKE 1 TABLET (20 MG TOTAL) BY MOUTH DAILY. 90 tablet 0  . losartan (COZAAR) 25 MG tablet Take 1 tablet (25 mg total) by mouth daily. 30 tablet 10   No facility-administered medications prior to visit.      Allergies:   Morphine   Social History   Social History  . Marital status: Single    Spouse name: N/A  . Number of children: 1  . Years of education: N/A   Occupational History  . Flooring    Social History Main Topics  . Smoking status: Former Smoker    Types: Cigarettes    Quit date: 10/17/1989  . Smokeless tobacco: Never Used  . Alcohol use 0.6 oz/week    1 Cans of beer per week  . Drug use: Yes    Frequency: 2.0 times per week    Types: Marijuana  . Sexual activity: Not Asked   Other Topics Concern  . None   Social History Narrative              Family History:  The patient's family history includes Coronary artery disease (age of onset: 4350) in his father; Healthy in his mother; Heart attack in his father.   Review of Systems:   Please see the history of present illness.     General:  No chills, fever, night sweats or weight changes.  Cardiovascular:  No chest pain, edema, palpitations, paroxysmal nocturnal dyspnea. Positive for dyspnea on exertion and orthopnea.  Dermatological: No rash, lesions/masses Respiratory: No cough, dyspnea Urologic: No hematuria, dysuria Abdominal:   No nausea, vomiting, diarrhea, bright red blood per rectum, melena, or hematemesis. Positive for "heartburn".  Neurologic:  No visual changes, wkns, changes in mental status. All other systems reviewed and are otherwise negative except as noted above.   Physical Exam:    VS:  BP 114/78   Pulse 89   Ht 5\' 9"  (1.753 m)   Wt 263 lb (119.3 kg)   BMI 38.84 kg/m    General: Well developed, well nourished  overweight Caucasian male appearing in no acute distress. Head: Normocephalic, atraumatic, sclera non-icteric, no xanthomas, nares are without discharge.  Neck: No carotid bruits. JVD not elevated.  Lungs: Respirations regular and unlabored, without wheezes or rales.  Heart: Regular rate and rhythm. No S3 or S4.  No murmur, no rubs, or gallops appreciated. Abdomen: Soft, non-tender, non-distended with normoactive bowel sounds. No hepatomegaly. No rebound/guarding. No obvious abdominal masses. Msk:  Strength and tone appear normal for age. No joint deformities or effusions. Extremities: No clubbing or cyanosis. No lower extremity edema.  Distal pedal pulses are 2+ bilaterally. Neuro: Alert and oriented X  3. Moves all extremities spontaneously. No focal deficits noted. Psych:  Responds to questions appropriately with a normal affect. Skin: No rashes or lesions noted  Wt Readings from Last 3 Encounters:  03/09/17 263 lb (119.3 kg)  07/08/16 (!) 303 lb 3.2 oz (137.5 kg)  08/15/15 284 lb (128.8 kg)        Studies/Labs Reviewed:   EKG:  EKG is ordered today.  The ekg ordered today demonstrates NSR, HR 89, with no acute ST or T-wave changes when compared to prior tracings.   Recent Labs: No results found for requested labs within last 8760 hours.   Lipid Panel    Component Value Date/Time   CHOL 163 08/15/2015 0926   TRIG 208 (H) 08/15/2015 0926   HDL 44 08/15/2015 0926   CHOLHDL 3.7 08/15/2015 0926   VLDL 42 (H) 08/15/2015 0926   LDLCALC 77 08/15/2015 0926   LDLDIRECT 155.7 06/21/2013 1000    Additional studies/ records that were reviewed today include:   Cardiac Catheterization: 09/2011 LEFT VENTRICULOGRAM:  Left ventricular angiogram was done in the 30 RAO projection and revealed severe anterior and apical hypokinesis with an estimated ejection fraction of 30 %.  LVEDP was 30 mmHg.  IMPRESSIONS:  1. Normal left main coronary artery.  Mild disease in the circumflex  system. 2. Occluded LAD which was successfully stented with a drug-eluting stent to the mid vessel.  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 3. 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 Severe left ventricular systolic dysfunction.  LVEDP 30 mmHg.  Ejection fraction 30 %.  RECOMMENDATION:  The patient will be watched in the ICU.  He'll need aggressive risk factor modification.  He'll need dual antiplatelet therapy for at least a year.  We'll start beta blocker and statin.  He will benefit from weight loss.   Assessment:    1. Dyspnea on exertion   2. Coronary artery disease involving native coronary artery of native heart with angina pectoris (HCC)   3. Ischemic cardiomyopathy   4. Essential hypertension   5. Nausea      Plan:   In order of problems listed above:  1. Dyspnea on Exertion -  he reports acute dyspnea on exertion starting yesterday morning which has continued throughout today and is relieved with rest. He denies any associated chest discomfort, palpitations, lightheadedness, dizziness, or presyncope. Does report "heartburn" but says this is different from his prior anginal symptoms. His EKG does not show acute ischemic changes.  - he also notes calf pain and recent travel within the past 3 weeks. Ambulatory O2 saturations checked today and remained > 94%.  - has also experienced orthopnea and PND but lungs are clear on examination. - will check STAT D-dimer, Troponin, and BNP in the setting of his acute symptoms as it is a complicated scenario with him reporting multiple associated symptoms. If D-dimer is elevated, will need a CTA to rule-out a PE. If Troponin is elevated, he is aware he will need to proceed to the Emergency Department for further evaluation. Will provide with Rx for SL NTG. Plan for Treadmill Myoview to assess for ischemia if troponin is normal.   2. CAD - s/p anterior  STEMI in 09/2011 with aspiration of occluded LAD with DES placed at that time with CTO of RCA. - he reports dyspnea on exertion as above with heartburn, but denies any shooting chest pain similar to his  prior anginal symptoms.  - continue ASA, BB, and statin. Plan for Treadmill Myoview as above. Will provide Rx for SL NTG.  3. Ischemic Cardiomyopathy - EF at 30% by cath in 2012. - He refuses a repeat echocardiogram as this has not been covered by his insurance in the past. Treadmill Myoview will allow for some assessment of his EF.  - continue BB and ARB.  4. HTN - BP well-controlled at 114/78 at today's visit. - continue Coreg 12.5mg  BID and Cozaar 25mg  daily.   5. Nausea - occurring in the setting of Marijuana use. Possible secondary to cyclic vomiting syndrome. He has no intention of reducing his Marijuana use.    Medication Adjustments/Labs and Tests Ordered: Current medicines are reviewed at length with the patient today.  Concerns regarding medicines are outlined above.  Medication changes, Labs and Tests ordered today are listed in the Patient Instructions below. Patient Instructions  Medication Instructions: No changes  Labwork: Please have the following labs drawn today: BNP, Troponin, and D-Dimer  Procedures/Testing: Your physician has requested that you have an exercise stress myoview. For further information please visit [email or web address] Please follow instruction sheet, as given. This will be done at [address] [facility], [address].   Follow-Up: Your physician wants you to follow-up in: 6 months with Dr. Antoine Poche. You will receive a reminder letter in the mail two months in advance. If you don't receive a letter, please call our office to schedule the follow-up appointment.   If you need a refill on your cardiac medications before your next appointment, please call your pharmacy.   Signed, Ellsworth Lennox, PA-C  03/09/2017 4:34 PM    [facility]  [facility] [address] [place], [address] [place], [place]  [address] Phone: [phone]; Fax: [phone]  [address] [place], [place] [address] Phone: [phone]

## 2017-03-09 NOTE — Telephone Encounter (Signed)
New Message     Pt c/o Shortness Of Breath: STAT if SOB developed within the last 24 hours or pt is noticeably SOB on the phone  1. Are you currently SOB (can you hear that pt is SOB on the phone)? Wife on phone  2. How long have you been experiencing SOB? Since yesterday at 11am   3. Are you SOB when sitting or when up moving around? All day   4. Are you currently experiencing any other symptoms? Some sweating

## 2017-03-09 NOTE — Telephone Encounter (Signed)
Call from [facility].  D-Dimer, Troponin, BNP were normal.   Renae FickleAditya Sarrinah Gardin, MD Cardiology

## 2017-03-09 NOTE — Patient Instructions (Signed)
Medication Instructions: No changes  Labwork: Please have the following labs drawn today: BNP, Troponin, and D-Dimer  Procedures/Testing: Your physician has requested that you have an exercise stress myoview. For further information please visit [email or web address] Please follow instruction sheet, as given. This will be done at [address] [facility] [address].   Follow-Up: Your physician wants you to follow-up in: 6 months with Dr. Antoine Poche. You will receive a reminder letter in the mail two months in advance. If you don't receive a letter, please call our office to schedule the follow-up appointment.   If you need a refill on your cardiac medications before your next appointment, please call your pharmacy.    Exercise Stress Electrocardiogram An exercise stress electrocardiogram is a test that is done to evaluate the blood supply to your heart. This test may also be called exercise stress electrocardiography. The test is done while you are walking on a treadmill. The goal of this test is to raise your heart rate. This test is done to find areas of poor blood flow to the heart by determining the extent of coronary artery disease (CAD). CAD is defined as narrowing in one or more heart (coronary) arteries of more than 70%. If you have an abnormal test result, this may mean that you are not getting adequate blood flow to your heart during exercise. Additional testing may be needed to understand why your test was abnormal. Tell a health care provider about:  Any allergies you have.  All medicines you are taking, including vitamins, herbs, eye drops, creams, and over-the-counter medicines.  Any problems you or family members have had with anesthetic medicines.  Any blood disorders you have.  Any surgeries you have had.  Any medical conditions you have.  Possibility of pregnancy, if this applies. What are the risks? Generally, this is a safe procedure. However, as with any procedure,  complications can occur. Possible complications can include:  Pain or pressure in the following areas:  Chest.  Jaw or neck.  Between your shoulder blades.  Radiating down your left arm.  Dizziness or light-headedness.  Shortness of breath.  Increased or irregular heartbeats.  Nausea or vomiting.  Heart attack (rare). What happens before the procedure?  Avoid all forms of caffeine 24 hours before your test or as directed by your health care provider. This includes coffee, tea (even decaffeinated tea), caffeinated sodas, chocolate, cocoa, and certain pain medicines.  Follow your health care provider's instructions regarding eating and drinking before the test.  Take your medicines as directed at regular times with water unless instructed otherwise. Exceptions may include:  If you have diabetes, ask how you are to take your insulin or pills. It is common to adjust insulin dosing the morning of the test.  If you are taking beta-blocker medicines, it is important to talk to your health care provider about these medicines well before the date of your test. Taking beta-blocker medicines may interfere with the test. In some cases, these medicines need to be changed or stopped 24 hours or more before the test.  If you wear a nitroglycerin patch, it may need to be removed prior to the test. Ask your health care provider if the patch should be removed before the test.  If you use an inhaler for any breathing condition, bring it with you to the test.  If you are an outpatient, bring a snack so you can eat right after the stress phase of the test.  Do not smoke for 4  hours prior to the test or as directed by your health care provider.  Do not apply lotions, powders, creams, or oils on your chest prior to the test.  Wear loose-fitting clothes and comfortable shoes for the test. This test involves walking on a treadmill. What happens during the procedure?  Multiple patches  (electrodes) will be put on your chest. If needed, small areas of your chest may have to be shaved to get better contact with the electrodes. Once the electrodes are attached to your body, multiple wires will be attached to the electrodes and your heart rate will be monitored.  Your heart will be monitored both at rest and while exercising.  You will walk on a treadmill. The treadmill will be started at a slow pace. The treadmill speed and incline will gradually be increased to raise your heart rate. What happens after the procedure?  Your heart rate and blood pressure will be monitored after the test.  You may return to your normal schedule including diet, activities, and medicines, unless your health care provider tells you otherwise. This information is not intended to replace advice given to you by your health care provider. Make sure you discuss any questions you have with your health care provider. Document Released: 10/01/2000 Document Revised: 03/11/2016 Document Reviewed: 06/11/2013 Elsevier Interactive Patient Education  2017 [employer]

## 2017-03-09 NOTE — Telephone Encounter (Signed)
Received a call from patient.He stated he started having sob yesterday while at work.Stated he is still having sob only when he gets up and tries to do anything.No chest pain.He also perspires heavy with exertion.Appointment scheduled with Randall AnBrittany Strader PA this afternoon at 2:30 pm.

## 2017-03-10 ENCOUNTER — Telehealth (HOSPITAL_COMMUNITY): Payer: Self-pay

## 2017-03-10 NOTE — Telephone Encounter (Signed)
Encounter complete. 

## 2017-03-15 ENCOUNTER — Ambulatory Visit (HOSPITAL_COMMUNITY)
Admission: RE | Admit: 2017-03-15 | Discharge: 2017-03-15 | Disposition: A | Discharge status: Home / Self Care | Payer: BLUE CROSS/BLUE SHIELD | Source: Ambulatory Visit | Attending: Cardiovascular Disease | Admitting: Cardiovascular Disease

## 2017-03-15 DIAGNOSIS — I2510 Atherosclerotic heart disease of native coronary artery without angina pectoris: Secondary | ICD-10-CM | POA: Insufficient documentation

## 2017-03-15 DIAGNOSIS — R0609 Other forms of dyspnea: Secondary | ICD-10-CM | POA: Diagnosis not present

## 2017-03-15 DIAGNOSIS — I509 Heart failure, unspecified: Secondary | ICD-10-CM | POA: Diagnosis not present

## 2017-03-15 DIAGNOSIS — I252 Old myocardial infarction: Secondary | ICD-10-CM | POA: Insufficient documentation

## 2017-03-15 DIAGNOSIS — Z8249 Family history of ischemic heart disease and other diseases of the circulatory system: Secondary | ICD-10-CM | POA: Diagnosis not present

## 2017-03-15 DIAGNOSIS — J45909 Unspecified asthma, uncomplicated: Secondary | ICD-10-CM | POA: Insufficient documentation

## 2017-03-15 DIAGNOSIS — Z87891 Personal history of nicotine dependence: Secondary | ICD-10-CM | POA: Insufficient documentation

## 2017-03-15 LAB — MYOCARDIAL PERFUSION IMAGING
CHL CUP RESTING HR STRESS: 61 {beats}/min
CSEPPHR: 120 {beats}/min
LV sys vol: 99 mL
LVDIAVOL: 151 mL (ref 62–150)
SDS: 2
SRS: 18
SSS: 20
TID: 1.2

## 2017-03-15 MED ORDER — TECHNETIUM TC 99M TETROFOSMIN IV KIT
30.1000 | PACK | Freq: Once | INTRAVENOUS | Status: AC | PRN
  Administered 2017-03-15: 30.1 via INTRAVENOUS
  Filled 2017-03-15: qty 31

## 2017-03-15 MED ORDER — REGADENOSON 0.4 MG/5ML IV SOLN
0.4000 mg | Freq: Once | INTRAVENOUS | Status: AC
  Administered 2017-03-15: 0.4 mg via INTRAVENOUS

## 2017-03-15 MED ORDER — TECHNETIUM TC 99M TETROFOSMIN IV KIT
10.4000 | PACK | Freq: Once | INTRAVENOUS | Status: AC | PRN
  Administered 2017-03-15: 10.4 via INTRAVENOUS
  Filled 2017-03-15: qty 11

## 2017-03-16 ENCOUNTER — Telehealth: Payer: Self-pay | Admitting: Student

## 2017-03-16 NOTE — Telephone Encounter (Signed)
Returned pts call and discussed his stress test results. See result note. 

## 2017-03-16 NOTE — Telephone Encounter (Signed)
Patient returning call for results, thanks. °

## 2017-03-16 NOTE — Telephone Encounter (Signed)
-----   Message from Ellsworth LennoxBrittany M Strader, [place]PA-C sent at 03/15/2017  3:13 PM EDT ----- Please let the patient know his stress test showed evidence of his prior heart attack but no evidence of reversible ischemia (new blockage). EF remains reduced at 35%. If he is still experiencing significant dyspnea, please arrange for him to have close follow-up with Hochrein as his workup has not showed any acute abnormalities thus far. If symptoms have resolved in the interim, can keep his previously scheduled follow-up. Thank you.

## 2017-05-03 ENCOUNTER — Encounter: Payer: Self-pay | Admitting: Internal Medicine

## 2017-05-03 ENCOUNTER — Telehealth: Payer: Self-pay | Admitting: Internal Medicine

## 2017-05-03 ENCOUNTER — Ambulatory Visit (INDEPENDENT_AMBULATORY_CARE_PROVIDER_SITE_OTHER): Payer: BLUE CROSS/BLUE SHIELD | Admitting: Internal Medicine

## 2017-05-03 VITALS — BP 136/78 | HR 66 | Temp 97.8°F | Resp 14 | Ht 69.0 in | Wt 274.5 lb

## 2017-05-03 DIAGNOSIS — M722 Plantar fascial fibromatosis: Secondary | ICD-10-CM

## 2017-05-03 DIAGNOSIS — W57XXXA Bitten or stung by nonvenomous insect and other nonvenomous arthropods, initial encounter: Secondary | ICD-10-CM

## 2017-05-03 DIAGNOSIS — R739 Hyperglycemia, unspecified: Secondary | ICD-10-CM

## 2017-05-03 DIAGNOSIS — S20462A Insect bite (nonvenomous) of left back wall of thorax, initial encounter: Secondary | ICD-10-CM | POA: Diagnosis not present

## 2017-05-03 DIAGNOSIS — E1065 Type 1 diabetes mellitus with hyperglycemia: Secondary | ICD-10-CM

## 2017-05-03 DIAGNOSIS — R7303 Prediabetes: Secondary | ICD-10-CM

## 2017-05-03 DIAGNOSIS — Z09 Encounter for follow-up examination after completed treatment for conditions other than malignant neoplasm: Secondary | ICD-10-CM | POA: Insufficient documentation

## 2017-05-03 DIAGNOSIS — J4520 Mild intermittent asthma, uncomplicated: Secondary | ICD-10-CM

## 2017-05-03 LAB — COMPREHENSIVE METABOLIC PANEL
ALT: 19 U/L (ref 0–53)
AST: 19 U/L (ref 0–37)
Albumin: 4 g/dL (ref 3.5–5.2)
Alkaline Phosphatase: 84 U/L (ref 39–117)
BILIRUBIN TOTAL: 0.4 mg/dL (ref 0.2–1.2)
BUN: 21 mg/dL (ref 6–23)
CALCIUM: 9.8 mg/dL (ref 8.4–10.5)
CO2: 32 meq/L (ref 19–32)
Chloride: 99 mEq/L (ref 96–112)
Creatinine, Ser: 1.18 mg/dL (ref 0.40–1.50)
GFR: 69.86 mL/min (ref >60.00)
GLUCOSE: 111 mg/dL — AB (ref 70–99)
Potassium: 4.6 mEq/L (ref 3.5–5.1)
Sodium: 136 mEq/L (ref 135–145)
Total Protein: 7.5 g/dL (ref 6.0–8.3)

## 2017-05-03 LAB — CBC WITH DIFFERENTIAL/PLATELET
BASOS ABS: 0.1 10*3/uL (ref 0.0–0.1)
Basophils Relative: 0.7 % (ref 0.0–3.0)
EOS ABS: 0.4 10*3/uL (ref 0.0–0.7)
Eosinophils Relative: 3.9 % (ref 0.0–5.0)
HCT: 45.9 % (ref 39.0–52.0)
Hemoglobin: 15.7 g/dL (ref 13.0–17.0)
LYMPHS ABS: 2.1 10*3/uL (ref 0.7–4.0)
LYMPHS PCT: 20.6 % (ref 12.0–46.0)
MCHC: 34.2 g/dL (ref 30.0–36.0)
MCV: 90.9 fl (ref 78.0–100.0)
MONO ABS: 0.9 10*3/uL (ref 0.1–1.0)
Monocytes Relative: 8.6 % (ref 3.0–12.0)
NEUTROS ABS: 6.9 10*3/uL (ref 1.4–7.7)
NEUTROS PCT: 66.2 % (ref 43.0–77.0)
PLATELETS: 224 10*3/uL (ref 150.0–400.0)
RBC: 5.05 Mil/uL (ref 4.22–5.81)
RDW: 13.5 % (ref 11.5–15.5)
WBC: 10.4 10*3/uL (ref 4.0–10.5)

## 2017-05-03 LAB — HEMOGLOBIN A1C: Hgb A1c MFr Bld: 5.9 % (ref 4.6–6.5)

## 2017-05-03 NOTE — Assessment & Plan Note (Signed)
Tick bite: Doubt he has currently sx related to the tick bite. He is concerned about tickborne diseases. Check Lyme serology, CBC. DOE: Recently seen by cardiology, BNP, d-dimer, troponin were negative. Stress test showed EF of 35% and no new ischemia. sxs  are better Asthma: Reports no recent cough and uses albuterol rarely. H/o prediabetes: Not checked in long time. Obtain a CMP, A1c. Heel pain, likely plantar fasciitis, stretching discussed. Med compliance: Not sure if taken losartan or Crestor. he will let me know. Addendum: Good compliance with medication. RTC 3 months.

## 2017-05-03 NOTE — Progress Notes (Signed)
Pre visit review using our clinic review tool, if applicable. No additional management support is needed unless otherwise documented below in the visit note. 

## 2017-05-03 NOTE — Telephone Encounter (Signed)
Caller name:Quentez Katherina RightDenny Relationship to patient: Can be reached:[phone] Pharmacy:  Reason for call:FYI Taking 1 Aspirin 81mg , carvedilol 12.5mg  2x daily, furosemide 20mg  losartan 25mg  daily and rosuvastatin 20mg  daily

## 2017-05-03 NOTE — Telephone Encounter (Signed)
FYI.  Med list updated.

## 2017-05-03 NOTE — Patient Instructions (Signed)
GO TO THE LAB : Get the blood work     GO TO THE FRONT DESK Schedule your next appointment for a  checkup in 3 months  

## 2017-05-03 NOTE — Progress Notes (Signed)
Subjective:    Patient ID: Brian Gross., male    DOB: Aug 03, 1966, 51 y.o.   MRN: [id number]  DOS:  05/03/2017 Type of visit - description : New patient, acute. Interval history:  ~6 weeks ago, he found a tick at the left side of his upper back, it was flat, tick had white stripes on its back. The wife remove it, there was no redness or rash that he can tell however the area got slightly inflammation and he saw some discharge (?). Since then he started to develop some issues. Had DOE, went to cardiology, workup negative. Develop several lumps at the RIGHT axillary area, slt tender . All r"lumps" esolved except for one. Developed pain at the right heel, worse in the morning, worse with walking.   Review of Systems No fever chills Has chronic headaches, at baseline Has aches and pains, at baseline except for the right heel. Denies fatigue. DOE now better H/o asthma,  reports minimal cough, uses a inhaler only once every 2 or 3 weeks   Past Medical History:  Diagnosis Date  . Asthma   . Borderline diabetes   . CAD (coronary artery disease)    a. s/p anterior STEMI in 09/2011 with aspiration of occluded LAD with DES placed at that time, CTO of RCA noted. EF at 30%  . Chest pain   . CHF (congestive heart failure) (HCC)   . Gout   . 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 Ischemic cardiomyopathy    a. EF at 30% by cath in 09/2011. Has refused echos in the past.   . MI (myocardial infarction) (HCC)   . Obesity   . SOB (shortness of breath)     Past Surgical History:  Procedure Laterality Date  . CHOLECYSTECTOMY    . LEFT HEART CATHETERIZATION WITH CORONARY ANGIOGRAM N/A 10/18/2011   Procedure: LEFT HEART CATHETERIZATION WITH CORONARY ANGIOGRAM;  Surgeon: Corky Crafts, MD;  Location: [facility] CATH LAB;  Service: Cardiovascular;  Laterality: N/A;  . LUNG BIOPSY  1990s  . PERCUTANEOUS CORONARY STENT INTERVENTION (PCI-S) N/A 10/18/2011   Procedure: PERCUTANEOUS CORONARY STENT INTERVENTION (PCI-S);  Surgeon: Corky Crafts, MD;  Location: [facility] CATH LAB;  Service: Cardiovascular;  Laterality: N/A;    Social History   Social History  . Marital status: Married    Spouse name: N/A  . Number of children: 1  . Years of education: N/A   Occupational History  . Flooring    Social History Main Topics  . Smoking status: Former Smoker    Types: Cigarettes    Quit date: 10/17/1989  . Smokeless tobacco: Never Used  . Alcohol use 0.6 oz/week    1 Cans of beer per week  . Drug use: Yes    Frequency: 2.0 times per week    Types: Marijuana  . Sexual activity: Not on file   Other Topics Concern  . Not on file   Social History Narrative              Family History  Problem Relation Age of Onset  . Coronary artery disease Father 63       CABG  . Heart attack Father   . Healthy Mother   . Colon cancer Neg Hx   . Prostate cancer Neg Hx      Allergies as of 05/03/2017      Reactions   Morphine    Reaction unknown  Medication List       Accurate as of 05/03/17  8:23 PM. Always use your most recent med list.          albuterol 108 (90 Base) MCG/ACT inhaler Commonly known as:  VENTOLIN HFA Inhale 1-2 puffs into the lungs every 6 (six) hours as needed for wheezing or shortness of breath.   aspirin 81 MG tablet Take 81 mg by mouth daily.   carvedilol 12.5 MG tablet Commonly known as:  COREG Take 1 tablet (12.5 mg total) by mouth 2 (two) times daily.   furosemide 20 MG tablet Commonly known as:  LASIX TAKE 1 TABLET EVERY DAY   losartan 25 MG tablet Commonly known as:  COZAAR Take 1 tablet (25 mg total) by mouth daily.   nitroGLYCERIN 0.4 MG SL tablet Commonly known as:  NITROSTAT Place 1 tablet (0.4 mg total) under the tongue every 5 (five) minutes as needed for chest pain.   rosuvastatin 20 MG tablet Commonly known as:  CRESTOR TAKE 1 TABLET (20 MG TOTAL) BY MOUTH DAILY.            Objective:   Physical Exam BP 136/78 (BP Location: Left Arm, Patient Position: Sitting, Cuff Size: Normal)   Pulse 66   Temp 97.8 F (36.6 C) (Oral)   Resp 14   Ht 5\' 9"  (1.753 m)   Wt 274 lb 8 oz (124.5 kg)   SpO2 96%   BMI 40.54 kg/m  General:   Well developed, well nourished . NAD.  HEENT:  Normocephalic . Face symmetric, atraumatic Lungs:  CTA B Normal respiratory effort, no intercostal retractions, no accessory muscle use. Heart: RRR,  no murmur.  no pretibial edema bilaterally  Abdomen:  Not distended, soft, non-tender. No rebound or rigidity.  Skin: Area where the tick bite was on the left upper back is essentially normal. Axillary: No pathologic LAD is found on either side. He does have at the R axilla a superficial ,half centimeter in size mass c/w a sebaceous cyst. MSK: TTP at the plantar aspect of the right heel Neurologic:  alert & oriented X3.  Speech normal, gait appropriate for age and unassisted Psych--  Cognition and judgment appear intact.  Cooperative with normal attention span and concentration.  Behavior appropriate. No anxious or depressed appearing.    Assessment & Plan:   Assessment Hyperglycemia H/o Asthma CV: -CAD, MI 2012 -CHF, ischemic cardiomyopathy Gout H/o lung bx (1990s) Morbid obesity  PLAN: Tick bite: Doubt he has currently sx related to the tick bite. He is concerned about tickborne diseases. Check Lyme serology, CBC. DOE: Recently seen by cardiology, BNP, d-dimer, troponin were negative. Stress test showed EF of 35% and no new ischemia. sxs  are better Asthma: Reports no recent cough and uses albuterol rarely. H/o prediabetes: Not checked in long time. Obtain a CMP, A1c. Heel pain, likely plantar fasciitis, stretching discussed. Med compliance: Not sure if taken losartan or Crestor. he will let me know. Addendum: Good compliance with medication. RTC 3 months.

## 2017-05-06 LAB — LYME ABY, WSTRN BLT IGG & IGM W/BANDS
B burgdorferi IgG Abs (IB): NEGATIVE
B burgdorferi IgM Abs (IB): NEGATIVE
LYME DISEASE 28 KD IGG: NONREACTIVE
LYME DISEASE 30 KD IGG: NONREACTIVE
LYME DISEASE 39 KD IGM: NONREACTIVE
LYME DISEASE 41 KD IGG: REACTIVE — AB
LYME DISEASE 66 KD IGG: NONREACTIVE
Lyme Disease 18 kD IgG: NONREACTIVE
Lyme Disease 23 kD IgG: NONREACTIVE
Lyme Disease 23 kD IgM: NONREACTIVE
Lyme Disease 39 kD IgG: NONREACTIVE
Lyme Disease 41 kD IgM: NONREACTIVE
Lyme Disease 45 kD IgG: REACTIVE — AB
Lyme Disease 58 kD IgG: NONREACTIVE
Lyme Disease 93 kD IgG: NONREACTIVE

## 2017-05-13 ENCOUNTER — Other Ambulatory Visit: Payer: Self-pay | Admitting: Cardiology

## 2017-05-14 ENCOUNTER — Other Ambulatory Visit: Payer: Self-pay | Admitting: Cardiology

## 2017-07-13 ENCOUNTER — Telehealth: Payer: Self-pay | Admitting: Internal Medicine

## 2017-07-13 NOTE — Telephone Encounter (Signed)
Pt's wife very upset states pt received a bill for $177.50 from [facility] stating that it did not have correct code for procedures and that is the reason it was not processed with insurance and was not paid. [employer]) pt is needing to resubmit bill for the procedures on the date of service 04-23-17. Pt tel [phone] Wife's name Burna Mortimer.  Please advise ASAP.

## 2017-07-17 ENCOUNTER — Other Ambulatory Visit: Payer: Self-pay | Admitting: Cardiology

## 2017-07-18 NOTE — Telephone Encounter (Signed)
REFILL 

## 2017-08-04 ENCOUNTER — Ambulatory Visit: Payer: BLUE CROSS/BLUE SHIELD | Admitting: Internal Medicine

## 2017-08-04 DIAGNOSIS — Z0289 Encounter for other administrative examinations: Secondary | ICD-10-CM

## 2017-09-13 NOTE — Telephone Encounter (Signed)
Sent message to our [facility] rep for more information regarding the patients bill, I will follow up with the patient as soon as I hear back

## 2017-09-14 NOTE — Telephone Encounter (Signed)
Got Confirmation from Vickie with [facility] that claim will be re-filed with patients insurance, Spoke with wife and informed her that this was being re-filled and could take up to 30 days. Left message with my direct line so she can reach me with any further questions or concerns.

## 2017-10-20 ENCOUNTER — Encounter: Payer: Self-pay | Admitting: Family Medicine

## 2017-10-20 ENCOUNTER — Ambulatory Visit: Payer: BLUE CROSS/BLUE SHIELD | Admitting: Family Medicine

## 2017-10-20 VITALS — BP 110/82 | HR 80 | Temp 97.7°F | Ht 68.0 in | Wt 281.1 lb

## 2017-10-20 DIAGNOSIS — J029 Acute pharyngitis, unspecified: Secondary | ICD-10-CM | POA: Diagnosis not present

## 2017-10-20 LAB — POCT RAPID STREP A (OFFICE): Rapid Strep A Screen: NEGATIVE

## 2017-10-20 MED ORDER — HYDROCODONE-HOMATROPINE 5-1.5 MG/5ML PO SYRP: 5.0000 mL | ORAL_SOLUTION | Freq: Four times a day (QID) | ORAL | 0 refills | Status: DC | PRN

## 2017-10-20 NOTE — Progress Notes (Signed)
Chief Complaint  Patient presents with  . Sore Throat    Brian E Wardlow Sr. here for URI complaints.  Duration: 9 days  Associated symptoms: sore throat and cough Denies: sinus congestion, sinus pain, rhinorrhea, itchy watery eyes, ear pain, ear drainage, shortness of breath, myalgia and fevers/rigors Treatment to date: Tylenol Sick contacts: No  ROS:  Const: Denies fevers HEENT: As noted in HPI Lungs: No SOB  Past Medical History:  Diagnosis Date  . Asthma   . Borderline diabetes   . CAD (coronary artery disease)    a. s/p anterior STEMI in 09/2011 with aspiration of occluded LAD with DES placed at that time, CTO of RCA noted. EF at 30%  . Chest pain   . CHF (congestive heart failure) (HCC)   . Gout   . Granulomatosis    chronic granulomatosis (dx at age 52 @ [facility])  . Ischemic cardiomyopathy    a. EF at 30% by cath in 09/2011. Has refused echos in the past.   . MI (myocardial infarction) (HCC)   . Obesity   . SOB (shortness of breath)    Family History  Problem Relation Age of Onset  . Coronary artery disease Father 8050       CABG  . Heart attack Father   . Healthy Mother   . Colon cancer Neg Hx   . Prostate cancer Neg Hx     BP 110/82 (BP Location: Left Arm, Patient Position: Sitting, Cuff Size: Large)   Pulse 80   Temp 97.7 F (36.5 C) (Oral)   Ht 5\' 8"  (1.727 m)   Wt 281 lb 2 oz (127.5 kg)   SpO2 95%   BMI 42.74 kg/m  General: Awake, alert, appears stated age HEENT: AT, Spray, ears patent b/l and TM's neg, nares patent w/o discharge, pharynx mildly erythematous and without exudates, MMM Neck: No masses or asymmetry Heart: RRR, no bruits Lungs: CTAB, no accessory muscle use Psych: Age appropriate judgment and insight, normal mood and affect  Viral pharyngitis - Plan: POCT rapid strep A, HYDROcodone-homatropine (HYCODAN) 5-1.5 MG/5ML syrup  Orders as above. Pain worse at night, syrup will help sleep. Do not drink, drive or do anything that requires  alertness while on syrup. Ibuprofen. Salt water gargles.  Continue to push fluids, practice good hand hygiene, cover mouth when coughing. F/u prn. If starting to experience fevers, shaking, or shortness of breath, seek immediate care. Pt voiced understanding and agreement to the plan.  Jilda Rocheicholas Paul SantaquinWendling, DO 10/20/17 12:04 PM

## 2017-10-20 NOTE — Progress Notes (Signed)
Pre visit review using our clinic review tool, if applicable. No additional management support is needed unless otherwise documented below in the visit note. 

## 2017-10-20 NOTE — Patient Instructions (Addendum)
Continue to push fluids, practice good hand hygiene, and cover your mouth if you cough.  Do not drink alcohol or drive while on the cough syrup.   If you start having fevers, shaking or shortness of breath, seek immediate care.  Ibuprofen 400-600 mg (2-3 over the counter strength tabs) every 6 hours as needed for pain.  OK to take Tylenol 1000 mg (2 extra strength tabs) or 975 mg (3 regular strength tabs) every 6 hours as needed.  Let us know if you need anything.

## 2017-11-15 ENCOUNTER — Other Ambulatory Visit: Payer: Self-pay | Admitting: Cardiology

## 2017-11-15 NOTE — Telephone Encounter (Signed)
Rx(s) sent to pharmacy electronically.  

## 2017-11-16 ENCOUNTER — Telehealth: Payer: Self-pay | Admitting: Cardiology

## 2017-11-16 NOTE — Telephone Encounter (Signed)
Second call to the patient to schedule 6 month followup with Dr. Antoine PocheHochrein.

## 2017-11-25 ENCOUNTER — Telehealth: Payer: Self-pay | Admitting: Cardiology

## 2017-11-25 NOTE — Telephone Encounter (Signed)
Called 4th time to schedule patient for an overdue followup with Dr. Antoine PocheHochrein, LVM.

## 2017-12-13 ENCOUNTER — Other Ambulatory Visit: Payer: Self-pay | Admitting: Cardiology

## 2018-02-06 ENCOUNTER — Other Ambulatory Visit: Payer: Self-pay | Admitting: Cardiology

## 2018-02-07 NOTE — Telephone Encounter (Signed)
REFILL 

## 2018-03-23 ENCOUNTER — Other Ambulatory Visit: Payer: Self-pay | Admitting: Cardiology

## 2018-03-23 NOTE — Telephone Encounter (Signed)
Rx sent to pharmacy   

## 2018-04-10 ENCOUNTER — Other Ambulatory Visit: Payer: Self-pay | Admitting: Cardiology

## 2018-04-10 NOTE — Telephone Encounter (Signed)
Rx request sent to pharmacy.  

## 2018-05-21 ENCOUNTER — Other Ambulatory Visit: Payer: Self-pay | Admitting: Cardiology

## 2018-05-25 ENCOUNTER — Other Ambulatory Visit: Payer: Self-pay | Admitting: Cardiology

## 2018-05-26 NOTE — Telephone Encounter (Signed)
Losartan refused. Was refilled 05/22/18

## 2018-05-30 ENCOUNTER — Other Ambulatory Visit: Payer: Self-pay | Admitting: Cardiology

## 2018-06-02 ENCOUNTER — Telehealth: Payer: Self-pay | Admitting: Cardiology

## 2018-06-02 ENCOUNTER — Other Ambulatory Visit: Payer: Self-pay

## 2018-06-02 MED ORDER — LOSARTAN POTASSIUM 25 MG PO TABS: 25.0000 mg | ORAL_TABLET | Freq: Every day | ORAL | 1 refills | Status: DC

## 2018-06-02 NOTE — Telephone Encounter (Signed)
Patient called in stating he had no received his medication. I advised patient that it was sent in on 05/30/18, also advised that we needed to make him an appointment. Scheduled appointment, and told patient which pharmacy medication was sent too. He will call back if it was still unable to pick up.

## 2018-06-02 NOTE — Telephone Encounter (Signed)
New Message:       *STAT* If patient is at the pharmacy, call can be transferred to refill team.   1. Which medications need to be refilled? (please list name of each medication and dose if known) losartan (COZAAR) 25 MG tablet  2. Which pharmacy/location (including street and city if local pharmacy) is medication to be sent to?[id number] - [place], [place] - [address]  3. Do they need a 30 day or 90 day supply? 30

## 2018-06-13 ENCOUNTER — Encounter: Payer: Self-pay | Admitting: Cardiology

## 2018-07-03 NOTE — Progress Notes (Signed)
HPI The patient presents for evaluation of CAD. He had a negative stress perfusion study 2008. Unfortunately he presented in late December of 2012 with an anterior myocardial infarction. This was a late presentation. He did have an occluded LAD which was treated with a drug-eluting stent. There was also RCA occlusion which was apparently chronic.  He did not come back for follow-up for about 2 years until he got some insurance. He stopped his medication. He then came back in  2015. I tried to get an ultrasound but he could not afford this.  I saw him in 2017 and he was not able to afford all of his medicines.  He last year was added on [facility] [place] [facility]. He had a [employer] Myoview.  There was a larger area of anterior/anteroseptal and apical wall defect consistent with scar.  The EF was 35%.  He has not been back for follow up since that time.   Unfortunately since I last saw him his mother was in a fatal car accident.  He was doing very well up to that point and losing quite a bit of weight and he is gained a little bit back.  He still well under his peak.  He says he is breathing better.  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  He denies any other cardiovascular symptoms. The patient denies any new symptoms such as chest discomfort, neck or arm discomfort. There has been no new shortness of breath, PND or orthopnea. There have been no reported palpitations, presyncope or syncope.  He does say that he forgets to take his carvedilol second dose frequently.   Allergies  Allergen Reactions  . Morphine     Reaction unknown    Current Outpatient Medications  Medication Sig Dispense Refill  . albuterol (VENTOLIN HFA) 108 (90 Base) MCG/ACT inhaler Inhale 1-2 puffs into the lungs every 6 (six) hours as needed for wheezing or shortness of breath. 18 g 2  . aspirin 81 MG tablet Take 81 mg by mouth daily.      . carvedilol (COREG) 12.5 MG tablet TAKE 1 TABLET (12.5 MG TOTAL) BY MOUTH 2 (TWO) TIMES DAILY. NEED OFFICE VISIT FOR MORE REFILLS 30 tablet 0  . furosemide (LASIX) 20 MG tablet TAKE 1 TABLET (20 MG TOTAL) BY MOUTH DAILY. PLEASE CONTACT OFFICE FOR ADDITIONAL REFILLS 90 tablet 0  . losartan (COZAAR) 25 MG tablet Take 1 tablet (25 mg total) by mouth daily. KEEP OV. 90 tablet 1  . nitroGLYCERIN (NITROSTAT) 0.4 MG SL tablet Place 1 tablet (0.4 mg total) under the tongue every 5 (five) minutes as needed for chest pain. 25 tablet 3  . rosuvastatin (CRESTOR) 20 MG tablet TAKE 1 TABLET (20 MG TOTAL) BY MOUTH DAILY. PLEASE CONTACT OFFICE FOR ADDITIONAL REFILLS 90 tablet 0   No current facility-administered medications for this visit.     Past Medical History:  Diagnosis Date  . Asthma   . Borderline diabetes   . CAD (coronary artery disease)    a. s/p anterior STEMI in 09/2011 with aspiration of occluded LAD with DES placed at that time, CTO of RCA noted. EF at 30%  . Chest pain   . CHF (congestive heart failure) (HCC)   . Gout   . Granulomatosis    chronic granulomatosis (dx at age 52 @ [facility])  . Ischemic cardiomyopathy  a. EF at 30% by cath in 09/2011. Has refused echos in the past.   . MI (myocardial infarction) (HCC)   . Obesity   . SOB (shortness of breath)     Past Surgical History:  Procedure Laterality Date  . CHOLECYSTECTOMY    . LEFT HEART CATHETERIZATION WITH CORONARY ANGIOGRAM N/A 10/18/2011   Procedure: LEFT HEART CATHETERIZATION WITH CORONARY ANGIOGRAM;  Surgeon: Corky Crafts, MD;  Location: [facility] CATH LAB;  Service: Cardiovascular;  Laterality: N/A;  . LUNG BIOPSY  1990s  . PERCUTANEOUS CORONARY STENT INTERVENTION (PCI-S) N/A 10/18/2011   Procedure: PERCUTANEOUS CORONARY STENT INTERVENTION (PCI-S);  Surgeon: Corky Crafts, MD;  Location: [facility] CATH LAB;  Service: Cardiovascular;  Laterality: N/A;   ROS:   As stated in the HPI and negative for all other  systems.  PHYSICAL EXAM BP 122/81   Pulse 65   Ht 5\' 8"  (1.727 m)   Wt 263 lb 9.6 oz (119.6 kg)   BMI 40.08 kg/m  GENERAL:  Well appearing NECK:  No jugular venous distention, waveform within normal limits, carotid upstroke brisk and symmetric, no bruits, no thyromegaly LUNGS:  Clear to auscultation bilaterally CHEST:  Unremarkable HEART:  PMI not displaced or sustained,S1 and S2 within normal limits, no S3, no S4, no clicks, no rubs, no murmurs ABD:  Flat, positive bowel sounds normal in frequency in pitch, no bruits, no rebound, no guarding, no midline pulsatile mass, no hepatomegaly, no splenomegaly EXT:  2 plus pulses throughout, no edema, no cyanosis no clubbing   EKG:  Sinus rhythm, rate 65, low-voltage in limb leads, old inferior infarct, old anteroseptal infarct, no change from previous. 07/04/2018    ASSESSMENT AND PLAN  CAD:  The patient has no new sypmtoms.  No further cardiovascular testing is indicated.  We will continue with aggressive risk reduction and meds as listed.  ISCHEMIC CARDIOMYOPATHY:    He is been reluctant for med titration and studies such as echocardiogram because of cost.  Today him to change his metoprolol to 100 mg XL daily because he forgets to take the p.m. dose of carvedilol.  He does agree to come back in about a month and we can titrate his Cozaar.  I doubt that he would be able to afford Entresto although this would be a goal going forward.    DYSLIPIDEMIA:    He will have a lipid profile today.  OBESITY:   I am proud of his weight loss and encouraged more of the same.

## 2018-07-04 ENCOUNTER — Encounter: Payer: Self-pay | Admitting: Cardiology

## 2018-07-04 ENCOUNTER — Ambulatory Visit: Payer: BLUE CROSS/BLUE SHIELD | Admitting: Cardiology

## 2018-07-04 VITALS — BP 122/81 | HR 65 | Ht 68.0 in | Wt 263.6 lb

## 2018-07-04 DIAGNOSIS — I2510 Atherosclerotic heart disease of native coronary artery without angina pectoris: Secondary | ICD-10-CM

## 2018-07-04 DIAGNOSIS — I255 Ischemic cardiomyopathy: Secondary | ICD-10-CM | POA: Diagnosis not present

## 2018-07-04 DIAGNOSIS — E785 Hyperlipidemia, unspecified: Secondary | ICD-10-CM | POA: Diagnosis not present

## 2018-07-04 DIAGNOSIS — Z79899 Other long term (current) drug therapy: Secondary | ICD-10-CM

## 2018-07-04 LAB — LIPID PANEL
CHOL/HDL RATIO: 3.2 ratio (ref 0.0–5.0)
Cholesterol, Total: 127 mg/dL (ref 100–199)
HDL: 40 mg/dL (ref >39)
LDL CALC: 61 mg/dL (ref 0–99)
TRIGLYCERIDES: 129 mg/dL (ref 0–149)
VLDL Cholesterol Cal: 26 mg/dL (ref 5–40)

## 2018-07-04 LAB — HEPATIC FUNCTION PANEL
ALT: 18 IU/L (ref 0–44)
AST: 20 IU/L (ref 0–40)
Albumin: 4.2 g/dL (ref 3.5–5.5)
Alkaline Phosphatase: 112 IU/L (ref 39–117)
BILIRUBIN TOTAL: 0.3 mg/dL (ref 0.0–1.2)
BILIRUBIN, DIRECT: 0.1 mg/dL (ref 0.00–0.40)
Total Protein: 6.9 g/dL (ref 6.0–8.5)

## 2018-07-04 MED ORDER — METOPROLOL SUCCINATE ER 100 MG PO TB24: 100.0000 mg | ORAL_TABLET | Freq: Every day | ORAL | 3 refills | Status: DC

## 2018-07-04 NOTE — Patient Instructions (Addendum)
Medication Instructions:  STOP- Carvedilol START- Metoprolol XL 100 mg daily  If you need a refill on your cardiac medications before your next appointment, please call your pharmacy.  Labwork: Fasting Lipid and Liver  Testing/Procedures: None Ordered   Follow-Up: Your physician wants you to follow-up in: 1 Month with APP for Medication Titration.      [facility] you [facility]!!

## 2018-07-06 ENCOUNTER — Encounter: Payer: Self-pay | Admitting: *Deleted

## 2018-07-09 ENCOUNTER — Other Ambulatory Visit: Payer: Self-pay | Admitting: Cardiology

## 2018-08-03 ENCOUNTER — Ambulatory Visit: Payer: BLUE CROSS/BLUE SHIELD | Admitting: Physician Assistant

## 2018-08-03 ENCOUNTER — Encounter: Payer: Self-pay | Admitting: Physician Assistant

## 2018-08-03 VITALS — BP 127/84 | HR 59 | Ht 68.0 in | Wt 269.8 lb

## 2018-08-03 DIAGNOSIS — E785 Hyperlipidemia, unspecified: Secondary | ICD-10-CM | POA: Diagnosis not present

## 2018-08-03 DIAGNOSIS — I10 Essential (primary) hypertension: Secondary | ICD-10-CM

## 2018-08-03 DIAGNOSIS — I2510 Atherosclerotic heart disease of native coronary artery without angina pectoris: Secondary | ICD-10-CM | POA: Diagnosis not present

## 2018-08-03 DIAGNOSIS — I255 Ischemic cardiomyopathy: Secondary | ICD-10-CM | POA: Diagnosis not present

## 2018-08-03 LAB — BASIC METABOLIC PANEL
BUN / CREAT RATIO: 21 — AB (ref 9–20)
BUN: 26 mg/dL — ABNORMAL HIGH (ref 6–24)
CALCIUM: 9.3 mg/dL (ref 8.7–10.2)
CHLORIDE: 97 mmol/L (ref 96–106)
CO2: 23 mmol/L (ref 20–29)
CREATININE: 1.21 mg/dL (ref 0.76–1.27)
GFR calc Af Amer: 81 mL/min/{1.73_m2} (ref >59)
GFR calc non Af Amer: 70 mL/min/{1.73_m2} (ref >59)
GLUCOSE: 89 mg/dL (ref 65–99)
Potassium: 4.5 mmol/L (ref 3.5–5.2)
Sodium: 137 mmol/L (ref 134–144)

## 2018-08-03 MED ORDER — LOSARTAN POTASSIUM 50 MG PO TABS: 50.0000 mg | ORAL_TABLET | Freq: Every day | ORAL | 1 refills | Status: DC

## 2018-08-03 NOTE — Patient Instructions (Addendum)
Medication Instructions:  Increase Losartan 50 mg daily If you need a refill on your cardiac medications before your next appointment, please call your pharmacy.   Lab work: BMET If you have labs (blood work) drawn today and your tests are completely normal, you will receive your results only by: Marland Kitchen MyChart Message (if you have MyChart) OR . A paper copy in the mail If you have any lab test that is abnormal or we need to change your treatment, we will call you to review the results.  Testing/Procedures: Your physician has requested that you have an echocardiogram. Echocardiography is a painless test that uses sound waves to create images of your heart. It provides your doctor with information about the size and shape of your heart and how well your heart's chambers and valves are working. This procedure takes approximately one hour. There are no restrictions for this procedure. Schedule ECHO in 3 months, but before follow up appointment.  Follow-Up: At [facility], you and your health needs are our priority.  As part of our continuing mission to provide you with exceptional heart care, we have created designated Provider Care Teams.  These Care Teams include your primary Cardiologist (physician) and Advanced Practice Providers (APPs -  Physician Assistants and Nurse Practitioners) who all work together to provide you with the care you need, when you need it. You will need a follow up appointment in 3 months.  Please call our office 2 months in advance to schedule this appointment.  You may see Rollene Rotunda, MD or one of the following Advanced Practice Providers on your designated Care Team:   Theodore Demark, PA-C . Joni Reining, DNP, ANP  Any Other Special Instructions Will Be Listed Below (If Applicable). Please ask Dr.Paz about referral to Dr.Norris for shoulder pain.
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
## 2018-08-15 ENCOUNTER — Other Ambulatory Visit: Payer: Self-pay

## 2018-08-15 DIAGNOSIS — I2510 Atherosclerotic heart disease of native coronary artery without angina pectoris: Secondary | ICD-10-CM

## 2018-08-21 ENCOUNTER — Ambulatory Visit: Payer: BLUE CROSS/BLUE SHIELD | Admitting: Cardiology

## 2018-08-24 ENCOUNTER — Telehealth: Payer: Self-pay

## 2018-08-24 NOTE — Telephone Encounter (Signed)
-----   Message from Darrol Jump, PA-C sent at 08/23/2018  1:45 PM EST ----- Please ask him to come in for a BMET, since losartan was increased. Do not have to be fasting, take all am meds. Blood work only. Thanks

## 2018-08-24 NOTE — Telephone Encounter (Signed)
Attempted to contact patient, did not answer.  No voicemail to leave message. BMET was ordered.

## 2018-09-28 ENCOUNTER — Telehealth: Payer: Self-pay

## 2018-09-28 NOTE — Telephone Encounter (Signed)
-----   Message from Darrol Jumphonda G Barrett, PA-C sent at 09/26/2018  4:01 PM EST ----- He still has not come in for his lab work.  Can you try again to reach him please? Thank you ----- Message ----- From: Jacinta ShoeBarrett, Rhonda G, PA-C Sent: 08/23/2018   1:45 PM EST To: Darrol Jumphonda G Barrett, PA-C, Lucita Ferraraierica T Young, CMA  Please ask him to come in for a BMET, since losartan was increased. Do not have to be fasting, take all am meds. Blood work only. Thanks

## 2018-09-28 NOTE — Telephone Encounter (Signed)
Attempted to contact patient, called first number on list unable to leave a voicemail, called work number on file was able to leave a voicemail to call back and left call back number.

## 2018-11-03 ENCOUNTER — Other Ambulatory Visit (HOSPITAL_COMMUNITY): Payer: BLUE CROSS/BLUE SHIELD

## 2018-11-07 ENCOUNTER — Encounter: Payer: Self-pay | Admitting: Physician Assistant

## 2018-11-13 ENCOUNTER — Encounter: Payer: Self-pay | Admitting: Physician Assistant

## 2018-11-13 ENCOUNTER — Ambulatory Visit: Payer: BLUE CROSS/BLUE SHIELD | Admitting: Cardiology
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
## 2018-11-16 ENCOUNTER — Ambulatory Visit: Payer: BLUE CROSS/BLUE SHIELD | Admitting: Cardiology

## 2018-11-17 ENCOUNTER — Telehealth: Payer: Self-pay

## 2018-11-17 NOTE — Telephone Encounter (Signed)
Noted ./cy 

## 2018-11-17 NOTE — Telephone Encounter (Signed)
New message    Just an FYI. We have made several attempts to contact this patient including sending a letter to schedule or reschedule their echocardiogram. We will be removing the patient from the echo WQ.   Thank you 

## 2018-11-22 ENCOUNTER — Telehealth: Payer: Self-pay

## 2018-11-22 NOTE — Telephone Encounter (Signed)
FYI. Thanks.

## 2018-11-22 NOTE — Telephone Encounter (Signed)
New message    Just an FYI. We have made several attempts to contact this patient including sending a letter to schedule or reschedule their echocardiogram. We will be removing the patient from the echo WQ.   Thank you 

## 2019-01-16 ENCOUNTER — Other Ambulatory Visit: Payer: Self-pay | Admitting: Cardiology

## 2019-01-16 MED ORDER — LOSARTAN POTASSIUM 50 MG PO TABS: 50.0000 mg | ORAL_TABLET | Freq: Every day | ORAL | 0 refills | Status: DC

## 2019-01-16 NOTE — Telephone Encounter (Signed)
New Message    *STAT* If patient is at the pharmacy, call can be transferred to refill team.   1. Which medications need to be refilled? (please list name of each medication and dose if known) Losartan 50mg   2. Which pharmacy/location (including street and city if local pharmacy) is medication to be sent to? [facility] [address]  3. Do they need a 30 day or 90 day supply? 90 day supply

## 2019-03-07 ENCOUNTER — Telehealth: Payer: Self-pay

## 2019-03-07 NOTE — Telephone Encounter (Signed)
Needs virtual visit- please call Pt's wife Burna Mortimer to set up- [phone]. Thank you.

## 2019-03-07 NOTE — Telephone Encounter (Signed)
Spoked with wife Burna Mortimer and informed the below, wife will call to schedule appt for spouse and son. Done

## 2019-04-13 ENCOUNTER — Other Ambulatory Visit: Payer: Self-pay | Admitting: Cardiology

## 2019-04-23 ENCOUNTER — Other Ambulatory Visit: Payer: Self-pay

## 2019-04-23 MED ORDER — ROSUVASTATIN CALCIUM 20 MG PO TABS: 20.0000 mg | ORAL_TABLET | Freq: Every day | ORAL | 2 refills | Status: DC

## 2019-04-23 MED ORDER — FUROSEMIDE 20 MG PO TABS: 20.0000 mg | ORAL_TABLET | Freq: Every day | ORAL | 2 refills | Status: DC

## 2019-05-14 ENCOUNTER — Encounter: Payer: Self-pay | Admitting: Internal Medicine

## 2019-05-15 ENCOUNTER — Other Ambulatory Visit: Payer: Self-pay | Admitting: Cardiology

## 2019-05-23 ENCOUNTER — Other Ambulatory Visit: Payer: Self-pay | Admitting: Cardiology

## 2019-07-11 ENCOUNTER — Other Ambulatory Visit: Payer: Self-pay | Admitting: Cardiology

## 2019-07-11 MED ORDER — LOSARTAN POTASSIUM 50 MG PO TABS: 50.0000 mg | ORAL_TABLET | Freq: Every day | ORAL | 0 refills | Status: DC

## 2019-07-11 MED ORDER — METOPROLOL SUCCINATE ER 100 MG PO TB24: 100.0000 mg | ORAL_TABLET | Freq: Every day | ORAL | 0 refills | Status: DC

## 2019-07-11 NOTE — Telephone Encounter (Signed)
° ° ° °  Patient out of medication   1. Which medications need to be refilled? (please list name of each medication and dose if known) metoprolol succinate (TOPROL-XL) 100 MG 24 hr tablet and losartan (COZAAR) 50 MG tablet  2. Which pharmacy/location (including street and city if local pharmacy) is medication to be sent to? [id number] [place], [place] [address]  3. Do they need a 30 day or 90 day supply? [place]

## 2019-08-07 ENCOUNTER — Other Ambulatory Visit: Payer: Self-pay | Admitting: Cardiology

## 2019-08-15 ENCOUNTER — Other Ambulatory Visit: Payer: Self-pay | Admitting: Cardiology
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
## 2019-09-05 ENCOUNTER — Ambulatory Visit (INDEPENDENT_AMBULATORY_CARE_PROVIDER_SITE_OTHER): Payer: BLUE CROSS/BLUE SHIELD | Admitting: Cardiology

## 2019-09-05 ENCOUNTER — Encounter: Payer: Self-pay | Admitting: Cardiology

## 2019-09-05 ENCOUNTER — Other Ambulatory Visit: Payer: Self-pay | Admitting: Cardiology

## 2019-09-05 ENCOUNTER — Other Ambulatory Visit: Payer: Self-pay

## 2019-09-05 VITALS — BP 112/80 | HR 64 | Temp 98.1°F | Ht 69.0 in | Wt 271.6 lb

## 2019-09-05 DIAGNOSIS — E785 Hyperlipidemia, unspecified: Secondary | ICD-10-CM | POA: Diagnosis not present

## 2019-09-05 DIAGNOSIS — I2510 Atherosclerotic heart disease of native coronary artery without angina pectoris: Secondary | ICD-10-CM | POA: Diagnosis not present

## 2019-09-05 DIAGNOSIS — R062 Wheezing: Secondary | ICD-10-CM

## 2019-09-05 DIAGNOSIS — I255 Ischemic cardiomyopathy: Secondary | ICD-10-CM

## 2019-09-05 MED ORDER — ALBUTEROL SULFATE HFA 108 (90 BASE) MCG/ACT IN AERS: 2.0000 | INHALATION_SPRAY | RESPIRATORY_TRACT | Status: DC | PRN

## 2019-09-05 MED ORDER — METOPROLOL SUCCINATE ER 100 MG PO TB24: 100.0000 mg | ORAL_TABLET | Freq: Every day | ORAL | 3 refills | Status: DC

## 2019-09-05 NOTE — Telephone Encounter (Signed)
° ° ° °*  STAT* If patient is at the pharmacy, call can be transferred to refill team.   1. Which medications need to be refilled? (please list name of each medication and dose if known) metoprolol  2. Which pharmacy/location (including street and city if local pharmacy) [facility] [address]   3. Do they need a 30 day or 90 day supply? [place]

## 2019-09-05 NOTE — Patient Instructions (Signed)
Medication Instructions:  Your physician recommends that you continue on your current medications as directed. Please refer to the Current Medication list given to you today.  If you need a refill on your cardiac medications before your next appointment, please call your pharmacy.   Lab work: NONE  Testing/Procedures: NONE  Follow-Up: At [facility], you and your health needs are our priority.  As part of our continuing mission to provide you with exceptional heart care, we have created designated Provider Care Teams.  These Care Teams include your primary Cardiologist (physician) and Advanced Practice Providers (APPs -  Physician Assistants and Nurse Practitioners) who all work together to provide you with the care you need, when you need it. You may see James Hochrein, MD or one of the following Advanced Practice Providers on your designated Care Team:    Rhonda Barrett, PA-C  Kathryn Lawrence, DNP, ANP  Cadence Furth, NP  Your physician wants you to follow-up in: 1 year. You will receive a reminder letter in the mail two months in advance. If you don't receive a letter, please call our office to schedule the follow-up appointment.    

## 2019-09-05 NOTE — Telephone Encounter (Signed)
Rx(s) sent to pharmacy electronically.  

## 2019-10-04 ENCOUNTER — Other Ambulatory Visit: Payer: Self-pay | Admitting: *Deleted

## 2019-10-04 MED ORDER — LOSARTAN POTASSIUM 50 MG PO TABS: 50.0000 mg | ORAL_TABLET | Freq: Every day | ORAL | 3 refills | Status: DC

## 2019-10-04 NOTE — Telephone Encounter (Signed)
Rx request sent to pharmacy.  

## 2019-12-03 ENCOUNTER — Other Ambulatory Visit: Payer: Self-pay | Admitting: Cardiology

## 2020-01-03 ENCOUNTER — Other Ambulatory Visit: Payer: Self-pay | Admitting: Cardiology

## 2020-01-03 NOTE — Telephone Encounter (Signed)
New Message   *STAT* If patient is at the pharmacy, call can be transferred to refill team.   1. Which medications need to be refilled? (please list name of each medication and dose if known) losartan (COZAAR) 50 MG tablet  2. Which pharmacy/location (including street and city if local pharmacy) is medication to be sent to? [id number] [place], [place] - [address]  3. Do they need a 30 day or 90 day supply? 30 day with refills   Patient is out of medication and needs today.

## 2020-01-04 ENCOUNTER — Other Ambulatory Visit: Payer: Self-pay

## 2020-01-04 MED ORDER — LOSARTAN POTASSIUM 50 MG PO TABS: 50.0000 mg | ORAL_TABLET | Freq: Every day | ORAL | 11 refills | Status: DC

## 2020-01-07 ENCOUNTER — Encounter: Payer: Self-pay | Admitting: Internal Medicine

## 2020-01-07 ENCOUNTER — Ambulatory Visit (INDEPENDENT_AMBULATORY_CARE_PROVIDER_SITE_OTHER): Payer: 59 | Admitting: Internal Medicine

## 2020-01-07 ENCOUNTER — Other Ambulatory Visit: Payer: Self-pay

## 2020-01-07 VITALS — BP 108/65 | HR 69 | Temp 95.9°F | Resp 16 | Ht 69.0 in | Wt 278.1 lb

## 2020-01-07 DIAGNOSIS — L089 Local infection of the skin and subcutaneous tissue, unspecified: Secondary | ICD-10-CM

## 2020-01-07 DIAGNOSIS — Z23 Encounter for immunization: Secondary | ICD-10-CM

## 2020-01-07 MED ORDER — DOXYCYCLINE HYCLATE 100 MG PO TABS: 100.0000 mg | ORAL_TABLET | Freq: Two times a day (BID) | ORAL | 0 refills | Status: DC

## 2020-01-07 MED ORDER — MUPIROCIN 2 % EX OINT: TOPICAL_OINTMENT | Freq: Every day | CUTANEOUS | 0 refills | Status: DC

## 2020-01-07 NOTE — Progress Notes (Signed)
Subjective:    Patient ID: Brian Gross., male    DOB: Apr 18, 1966, 54 y.o.   MRN: [id number]  DOS:  01/07/2020 Type of visit - description: Acute, last visit 2018 Patient pulled a hang nail ~ 12/08/19, shortly after the area look swollen, infected. On 12/18/2019, he picked the area and got ~ 1-2 mls of green, thick purulent discharge. It got better but subsequently the area started to get swollen and has bloody/purulent discharge on and off.  Review of Systems No fever chills.  Past Medical History:  Diagnosis Date  . Asthma   . Borderline diabetes   . CAD (coronary artery disease)    a. s/p anterior STEMI in 09/2011 with aspiration of occluded LAD with DES placed at that time, CTO of RCA noted. EF at 30%  . Chest pain   . CHF (congestive heart failure) (HCC)   . Gout   . Granulomatosis    chronic granulomatosis (dx at age 50 @ [facility])  . Ischemic cardiomyopathy    a. EF at 30% by cath in 09/2011. Has refused echos in the past.   . MI (myocardial infarction) (HCC)   . Obesity   . SOB (shortness of breath)     Past Surgical History:  Procedure Laterality Date  . CHOLECYSTECTOMY    . LEFT HEART CATHETERIZATION WITH CORONARY ANGIOGRAM N/A 10/18/2011   Procedure: LEFT HEART CATHETERIZATION WITH CORONARY ANGIOGRAM;  Surgeon: Corky Crafts, MD;  Location: [facility] CATH LAB;  Service: Cardiovascular;  Laterality: N/A;  . LUNG BIOPSY  1990s  . PERCUTANEOUS CORONARY STENT INTERVENTION (PCI-S) N/A 10/18/2011   Procedure: PERCUTANEOUS CORONARY STENT INTERVENTION (PCI-S);  Surgeon: Corky Crafts, MD;  Location: [facility] CATH LAB;  Service: Cardiovascular;  Laterality: N/A;    Allergies as of 01/07/2020      Reactions   Morphine    Reaction unknown      Medication List       Accurate as of January 07, 2020  1:39 PM. If you have any questions, ask your nurse or doctor.        albuterol 108 (90 Base) MCG/ACT inhaler Commonly known as: Ventolin HFA Inhale 1-2 puffs into  the lungs every 6 (six) hours as needed for wheezing or shortness of breath.   aspirin 81 MG tablet Take 81 mg by mouth daily.   furosemide 20 MG tablet Commonly known as: LASIX Take 1 tablet (20 mg total) by mouth daily.   losartan 50 MG tablet Commonly known as: COZAAR Take 1 tablet (50 mg total) by mouth daily.   metoprolol succinate 100 MG 24 hr tablet Commonly known as: TOPROL-XL Take 1 tablet (100 mg total) by mouth daily.   nitroGLYCERIN 0.4 MG SL tablet Commonly known as: NITROSTAT Place 1 tablet (0.4 mg total) under the tongue every 5 (five) minutes as needed for chest pain.   rosuvastatin 20 MG tablet Commonly known as: CRESTOR Take 1 tablet (20 mg total) by mouth daily.          Objective:   Physical Exam BP 108/65 (BP Location: Left Arm, Patient Position: Sitting, Cuff Size: Normal)   Pulse 69   Temp (!) 95.9 F (35.5 C) (Temporal)   Resp 16   Ht 5\' 9"  (1.753 m)   Wt 278 lb 2 oz (126.2 kg)   SpO2 97%   BMI 41.07 kg/m  General:   Well developed, NAD, BMI noted. HEENT:  Normocephalic . Face symmetric, atraumatic Left hand: L  Middle finger, see picture, + swelling and dry bloody purulence noted.  + Swelling without fluctuance.  neurologic:  alert & oriented X3.  Speech normal, gait appropriate for age and unassisted Psych--  Cognition and judgment appear intact.  Cooperative with normal attention span and concentration.  Behavior appropriate. No anxious or depressed appearing.        Assessment    ASSESSMENT Hyperglycemia H/o Asthma CV: -CAD, MI 2012 -CHF, ischemic cardiomyopathy Gout H/o lung bx (1990s) Morbid obesity   PLAN: Left middle finger infection: As described above, in a sterile fashion, dry blood was removed and I applied pressure to the finger without being able to get any discharge. Plan: Td today, doxycycline, mupirocin, see AVS. Call if not better for possibly orthopedic hand referral CAD: Sees cardiology regularly,  last visit November 2020, felt to be stable.  Could not proceed with echo due to cost. Due for labs and office visit, encouraged to schedule at his earliest convenience    This visit occurred during the SARS-CoV-2 public health emergency.  Safety protocols were in place, including screening questions prior to the visit, additional usage of staff PPE, and extensive cleaning of exam room while observing appropriate contact time as indicated for disinfecting solutions.

## 2020-01-07 NOTE — Patient Instructions (Addendum)
Soak the finger in Epsom salts, pat it dry and cover it with the antibiotic ointment I am sending and a Band-Aid.  Take antibiotics for 10 days  If is not better by the end of the treatment let me know for a referral  Call anytime if the area looks worse, you have fever chills or the finger gets more swelling  You are due for a routine visit, blood work, please arrange at your earliest convenience

## 2020-01-07 NOTE — Progress Notes (Signed)
Pre visit review using our clinic review tool, if applicable. No additional management support is needed unless otherwise documented below in the visit note. 

## 2020-01-07 NOTE — Assessment & Plan Note (Signed)
Left middle finger infection: As described above, in a sterile fashion, dry blood was removed and I applied pressure to the finger without being able to get any discharge. Plan: Td today, doxycycline, mupirocin, see AVS. Call if not better for possibly orthopedic hand referral CAD: Sees cardiology regularly, last visit November 2020, felt to be stable.  Could not proceed with echo due to cost. Due for labs and office visit, encouraged to schedule at his earliest convenience

## 2020-01-10 ENCOUNTER — Other Ambulatory Visit: Payer: Self-pay | Admitting: Cardiology

## 2020-01-12 ENCOUNTER — Other Ambulatory Visit: Payer: Self-pay | Admitting: Cardiology

## 2020-04-29 ENCOUNTER — Encounter: Payer: Self-pay | Admitting: Internal Medicine

## 2020-07-09 ENCOUNTER — Other Ambulatory Visit: Payer: Self-pay | Admitting: Cardiology

## 2020-08-25 ENCOUNTER — Other Ambulatory Visit: Payer: Self-pay | Admitting: Cardiology

## 2020-10-08 ENCOUNTER — Other Ambulatory Visit: Payer: Self-pay | Admitting: Cardiology

## 2020-12-15 ENCOUNTER — Other Ambulatory Visit: Payer: Self-pay | Admitting: Cardiology

## 2020-12-29 ENCOUNTER — Encounter: Payer: Self-pay | Admitting: Internal Medicine

## 2020-12-30 ENCOUNTER — Other Ambulatory Visit: Payer: Self-pay | Admitting: Cardiology

## 2021-01-08 ENCOUNTER — Other Ambulatory Visit: Payer: Self-pay | Admitting: Cardiology

## 2021-01-23 ENCOUNTER — Telehealth: Payer: Self-pay | Admitting: Cardiology

## 2021-01-23 ENCOUNTER — Other Ambulatory Visit: Payer: Self-pay | Admitting: Cardiology

## 2021-01-23 ENCOUNTER — Other Ambulatory Visit: Payer: Self-pay

## 2021-01-23 MED ORDER — FUROSEMIDE 20 MG PO TABS: 20.0000 mg | ORAL_TABLET | Freq: Every day | ORAL | 0 refills | Status: DC

## 2021-01-23 MED ORDER — ROSUVASTATIN CALCIUM 20 MG PO TABS: 20.0000 mg | ORAL_TABLET | Freq: Every day | ORAL | 0 refills | Status: DC

## 2021-01-23 NOTE — Telephone Encounter (Signed)
*  STAT* If patient is at the pharmacy, call can be transferred to refill team.   1. Which medications need to be refilled? (please list name of each medication and dose if known)  losartan (COZAAR) 50 MG tablet metoprolol succinate (TOPROL-XL) 100 MG 24 hr tablet furosemide (LASIX) 20 MG tablet rosuvastatin (CRESTOR) 20 MG tablet  2. Which pharmacy/location (including street and city if local pharmacy) is medication to be sent to? [id number] [place], [place] - [address]  3. Do they need a 30 day or 90 day supply? 30  Patient is scheduled to see Dr. Antoine Poche 02/19/21 but does not have enough medication to last him until then

## 2021-01-31 ENCOUNTER — Other Ambulatory Visit: Payer: Self-pay | Admitting: Cardiology

## 2021-02-18 NOTE — Progress Notes (Signed)
Cardiology Office Note   Date:  02/19/2021   ID:  Brian Ip Sr., DOB [birth year], MRN [id number]    PCP:  Colon Branch, MD  Cardiologist:   Minus Breeding, MD   Chief Complaint  Patient presents with  . Coronary Artery Disease      History of Present Illness: Brian E Haroun Cotham. is a 55 y.o. male who presents for evaluation of CAD. He had a negative stress perfusion study 2008. Unfortunately he presented in late December of 2012 with an anterior myocardial infarction. This was a late presentation. He did have an occluded LAD which was treated with a drug-eluting stent. There was also RCA occlusion which was apparently chronic.  He did not come back for follow-up for about 2 years until he got some insurance. He stopped his medication. He then came back in  2015. I tried to get an ultrasound but he could not afford this.  I saw him in 2017 and he was not able to afford all of his medicines.  In 2018 he was on [place] PA schedule at [facility] for acute dyspnea. He had a [employer].  There was a larger area of anterior/anteroseptal and apical wall defect consistent with scar.  The EF was 35%.  I last saw him in 2020.    Since I last saw him he has done well.  Sounds like he has been very compliant with his medications now.  In the past he has been reluctant because of cost to consider any changes or had lots of testing.  He says he is active.  He does lots of yard work.  He gets rare asthma attacks but he otherwise does not describe shortness of breath.  He has had no PND or orthopnea.  He has no palpitations, presyncope or syncope.  He is a [employer].   Past Medical History:  Diagnosis Date  . Asthma   . Borderline diabetes   . CAD (coronary artery disease)    a. s/p anterior STEMI in 09/2011 with aspiration of occluded LAD with DES placed at that time, CTO of RCA noted. EF at 30%  . Chest pain   . CHF (congestive heart failure) ([place])   . Gout   .  Granulomatosis    chronic granulomatosis (dx at age 32 @ [facility])  . Ischemic cardiomyopathy    a. EF at 30% by cath in 09/2011. Has refused echos in the past.   . MI (myocardial infarction) ([place])   . Obesity   . SOB (shortness of breath)     Past Surgical History:  Procedure Laterality Date  . CHOLECYSTECTOMY    . LEFT HEART CATHETERIZATION WITH CORONARY ANGIOGRAM N/A 10/18/2011   Procedure: LEFT HEART CATHETERIZATION WITH CORONARY ANGIOGRAM;  Surgeon: Jettie Booze, MD;  Location: [facility] CATH LAB;  Service: Cardiovascular;  Laterality: N/A;  . LUNG BIOPSY  1990s  . PERCUTANEOUS CORONARY STENT INTERVENTION (PCI-S) N/A 10/18/2011   Procedure: PERCUTANEOUS CORONARY STENT INTERVENTION (PCI-S);  Surgeon: Jettie Booze, MD;  Location: [facility] CATH LAB;  Service: Cardiovascular;  Laterality: N/A;     Current Outpatient Medications  Medication Sig Dispense Refill  . albuterol (VENTOLIN HFA) 108 (90 Base) MCG/ACT inhaler Inhale 1-2 puffs into the lungs every 6 (six) hours as needed for wheezing or shortness of breath. 18 g 2  . aspirin 81 MG tablet Take 81 mg by mouth daily.    . furosemide (LASIX) 20 MG tablet Take  1 tablet (20 mg total) by mouth daily. 30 tablet 0  . losartan (COZAAR) 100 MG tablet Take 1 tablet (100 mg total) by mouth daily. 90 tablet 3  . metoprolol succinate (TOPROL-XL) 100 MG 24 hr tablet TAKE 1 TABLET BY MOUTH EVERY DAY 90 tablet 3  . nitroGLYCERIN (NITROSTAT) 0.4 MG SL tablet Place 1 tablet (0.4 mg total) under the tongue every 5 (five) minutes as needed for chest pain. 25 tablet 3  . rosuvastatin (CRESTOR) 20 MG tablet TAKE 1 TABLET BY MOUTH EVERY DAY 20 tablet 0   No current facility-administered medications for this visit.    Allergies:   Morphine    ROS:  Please see the history of present illness.   Otherwise, review of systems are positive for none.   All other systems are reviewed and negative.    PHYSICAL EXAM: VS:  BP 128/80   Pulse 65   Ht  '5\' 9"'  (1.753 m)   Wt 290 lb (131.5 kg)   SpO2 96%   BMI 42.83 kg/m  , BMI Body mass index is 42.83 kg/m. GENERAL:  Well appearing NECK:  No jugular venous distention, waveform within normal limits, carotid upstroke brisk and symmetric, no bruits, no thyromegaly LUNGS:  Clear to auscultation bilaterally CHEST:  Unremarkable HEART:  PMI not displaced or sustained,S1 and S2 within normal limits, no S3, no S4, no clicks, no rubs, no murmurs ABD:  Flat, positive bowel sounds normal in frequency in pitch, no bruits, no rebound, no guarding, no midline pulsatile mass, no hepatomegaly, no splenomegaly EXT:  2 plus pulses throughout, no edema, no cyanosis no clubbing   EKG:  EKG is ordered today. The ekg ordered today demonstrates sinus rhythm, rate 65, axis within normal limits, old anteroseptal infarct, nonspecific T wave changes with lateral T wave inversions unchanged from previous, low voltage in the limb in chest leads.  The EKG is unchanged compared to previous.   Recent Labs: No results found for requested labs within last 8760 hours.    Lipid Panel    Component Value Date/Time   CHOL 127 07/04/2018 1009   TRIG 129 07/04/2018 1009   HDL 40 07/04/2018 1009   CHOLHDL 3.2 07/04/2018 1009   CHOLHDL 3.7 08/15/2015 0926   VLDL 42 (H) 08/15/2015 0926   LDLCALC 61 07/04/2018 1009   LDLDIRECT 155.7 06/21/2013 1000      Wt Readings from Last 3 Encounters:  02/19/21 290 lb (131.5 kg)  01/07/20 278 lb 2 oz (126.2 kg)  09/05/19 271 lb 9.6 oz (123.2 kg)      Other studies Reviewed: Additional studies/ records that were reviewed today include: Labs. Review of the above records demonstrates:  Please see elsewhere in the note.     ASSESSMENT AND PLAN:  CAD: He has had no ongoing chest pain.  I am trying to pursue secondary risk reduction.   ISCHEMIC CARDIOMYOPATHY:     I am happy that today he will let me titrate his medicine.  I do not think Delene Loll will be affordable for him  still but he will let me go 100 mg daily of his losartan.  In fact he says he will come back in 6 months and likely able to titrate his beta-blocker further as well.  Ultimately I would like to perform another echocardiogram, use an ARB.  However, I think I will have to slowly get him to this point.  He is reluctant to take a lot of medications  DYSLIPIDEMIA:  He will come back for fasting lipid profile c-Met and CBC.  OBESITY:   His weight is going up and we specifically talked about diet and suggestions.  He makes some reasonable choices with probably eats too many carbohydrates   Current medicines are reviewed at length with the patient today.  The patient does not have concerns regarding medicines.   The following changes have been made: As above  Labs/ tests ordered today include:   Orders Placed This Encounter  Procedures  . CBC  . Comprehensive Metabolic Panel (CMET)  . Lipid panel  . EKG 12-Lead     Disposition:   FU with me in 6 months   Signed, Minus Breeding, MD  02/19/2021 11:52 AM    [place]

## 2021-02-19 ENCOUNTER — Other Ambulatory Visit: Payer: Self-pay | Admitting: Cardiology

## 2021-02-19 ENCOUNTER — Other Ambulatory Visit: Payer: Self-pay

## 2021-02-19 ENCOUNTER — Ambulatory Visit (INDEPENDENT_AMBULATORY_CARE_PROVIDER_SITE_OTHER): Payer: 59 | Admitting: Cardiology

## 2021-02-19 ENCOUNTER — Encounter: Payer: Self-pay | Admitting: Cardiology

## 2021-02-19 VITALS — BP 128/80 | HR 65 | Ht 69.0 in | Wt 290.0 lb

## 2021-02-19 DIAGNOSIS — E785 Hyperlipidemia, unspecified: Secondary | ICD-10-CM

## 2021-02-19 DIAGNOSIS — I2510 Atherosclerotic heart disease of native coronary artery without angina pectoris: Secondary | ICD-10-CM | POA: Diagnosis not present

## 2021-02-19 DIAGNOSIS — Z79899 Other long term (current) drug therapy: Secondary | ICD-10-CM | POA: Diagnosis not present

## 2021-02-19 DIAGNOSIS — I255 Ischemic cardiomyopathy: Secondary | ICD-10-CM

## 2021-02-19 MED ORDER — LOSARTAN POTASSIUM 100 MG PO TABS: 100.0000 mg | ORAL_TABLET | Freq: Every day | ORAL | 3 refills | Status: DC

## 2021-02-19 NOTE — Telephone Encounter (Signed)
Rx(s) sent to pharmacy electronically.  

## 2021-02-19 NOTE — Patient Instructions (Signed)
Medication Instructions:  INCREASE- Losartan 100 mg by mouth daily  *If you need a refill on your cardiac medications before your next appointment, please call your pharmacy*   Lab Work: CMP, CBC and Fasting Lipids  If you have labs (blood work) drawn today and your tests are completely normal, you will receive your results only by: Marland Kitchen MyChart Message (if you have MyChart) OR . A paper copy in the mail If you have any lab test that is abnormal or we need to change your treatment, we will call you to review the results.   Testing/Procedures: None Ordered   Follow-Up: At [facility], you and your health needs are our priority.  As part of our continuing mission to provide you with exceptional heart care, we have created designated Provider Care Teams.  These Care Teams include your primary Cardiologist (physician) and Advanced Practice Providers (APPs -  Physician Assistants and Nurse Practitioners) who all work together to provide you with the care you need, when you need it.  We recommend signing up for the patient portal called "MyChart".  Sign up information is provided on this After Visit Summary.  MyChart is used to connect with patients for Virtual Visits (Telemedicine).  Patients are able to view lab/test results, encounter notes, upcoming appointments, etc.  Non-urgent messages can be sent to your provider as well.   To learn more about what you can do with MyChart, go to [email or web address].    Your next appointment:   6 month(s)  The format for your next appointment:   In Person  Provider:   You may see Rollene Rotunda, MD or one of the following Advanced Practice Providers on your designated Care Team:    Theodore Demark, PA-C  Joni Reining, DNP, ANP

## 2021-02-21 ENCOUNTER — Other Ambulatory Visit: Payer: Self-pay | Admitting: Cardiology

## 2021-03-15 ENCOUNTER — Other Ambulatory Visit: Payer: Self-pay | Admitting: Cardiology

## 2021-03-24 ENCOUNTER — Other Ambulatory Visit: Payer: Self-pay | Admitting: Cardiology

## 2021-04-16 ENCOUNTER — Other Ambulatory Visit: Payer: Self-pay | Admitting: Cardiology

## 2021-04-16 NOTE — Telephone Encounter (Signed)
Rx has been sent to the pharmacy electronically. ° °

## 2021-07-10 ENCOUNTER — Ambulatory Visit (INDEPENDENT_AMBULATORY_CARE_PROVIDER_SITE_OTHER): Payer: 59 | Admitting: Family

## 2021-07-10 ENCOUNTER — Ambulatory Visit (HOSPITAL_BASED_OUTPATIENT_CLINIC_OR_DEPARTMENT_OTHER)
Admission: RE | Admit: 2021-07-10 | Discharge: 2021-07-10 | Disposition: A | Discharge status: Home / Self Care | Payer: 59 | Source: Ambulatory Visit | Attending: Family | Admitting: Family

## 2021-07-10 ENCOUNTER — Other Ambulatory Visit: Payer: Self-pay

## 2021-07-10 VITALS — BP 111/59 | HR 62 | Temp 97.9°F | Resp 16 | Wt 301.0 lb

## 2021-07-10 DIAGNOSIS — M25512 Pain in left shoulder: Secondary | ICD-10-CM | POA: Insufficient documentation

## 2021-07-10 NOTE — Progress Notes (Signed)
Subjective:   By signing my name below, I, Brian Gross, attest that this documentation has been prepared under the direction and in the presence of Brian Craze, NP, 07/10/2021   Patient ID: Brian Jeans Sr., male    DOB: March 31, 1966, 55 y.o.   MRN: [id number]  No chief complaint on file.   HPI Patient is in today for an office visit.  Shoulder pain: He has been dealing with a shoulder injury for about a year in high right shoulder. He explained that he was in a deer stand and fell out of it and onto his shoulder. He explains that his shoulder began to feel better overtime but recently has regressed again. He notes that things like hanging clothes up on clothing rack and washing his armpits causes intense shoulder pain. He has limited range of motion in his arm. He also notes that lifting any weight cause pain in his shoulder.   Health Maintenance Due  Topic Date Due   COVID-19 Vaccine (1) Never done   HIV Screening  Never done   Hepatitis C Screening  Never done   Zoster Vaccines- Shingrix (1 of 2) Never done   COLONOSCOPY (Pts 45-45yrs Insurance coverage will need to be confirmed)  Never done   INFLUENZA VACCINE  Never done    Past Medical History:  Diagnosis Date   Asthma    Borderline diabetes    CAD (coronary artery disease)    a. s/p anterior STEMI in 09/2011 with aspiration of occluded LAD with DES placed at that time, CTO of RCA noted. EF at 30%   Chest pain    CHF (congestive heart failure) (HCC)    Gout    Granulomatosis    chronic granulomatosis (dx at age 61 @ [facility])   Ischemic cardiomyopathy    a. EF at 30% by cath in 09/2011. Has refused echos in the past.    MI (myocardial infarction) (HCC)    Obesity    SOB (shortness of breath)     Past Surgical History:  Procedure Laterality Date   CHOLECYSTECTOMY     LEFT HEART CATHETERIZATION WITH CORONARY ANGIOGRAM N/A 10/18/2011   Procedure: LEFT HEART CATHETERIZATION WITH CORONARY  ANGIOGRAM;  Surgeon: Corky Crafts, MD;  Location: [facility] CATH LAB;  Service: Cardiovascular;  Laterality: N/A;   LUNG BIOPSY  1990s   PERCUTANEOUS CORONARY STENT INTERVENTION (PCI-S) N/A 10/18/2011   Procedure: PERCUTANEOUS CORONARY STENT INTERVENTION (PCI-S);  Surgeon: Corky Crafts, MD;  Location: [facility] CATH LAB;  Service: Cardiovascular;  Laterality: N/A;    Family History  Problem Relation Age of Onset   Coronary artery disease Father 38       CABG   Heart attack Father    Healthy Mother    Colon cancer Neg Hx    Prostate cancer Neg Hx     Social History   Socioeconomic History   Marital status: Married    Spouse name: Not on file   Number of children: 1   Years of education: Not on file   Highest education level: Not on file  Occupational History   Occupation: Flooring  Tobacco Use   Smoking status: Former    Types: Cigarettes    Quit date: 10/17/1989    Years since quitting: 31.7   Smokeless tobacco: Never  Vaping Use   Vaping Use: Never used  Substance and Sexual Activity   Alcohol use: Yes    Alcohol/week: 1.0 standard drink    Types:  1 Cans of beer per week   Drug use: Yes    Frequency: 2.0 times per week    Types: Marijuana   Sexual activity: Not on file  Other Topics Concern   Not on file  Social History Narrative            Social Determinants of Health   Financial Resource Strain: Not on file  Food Insecurity: Not on file  Transportation Needs: Not on file  Physical Activity: Not on file  Stress: Not on file  Social Connections: Not on file  Intimate Partner Violence: Not on file    Outpatient Medications Prior to Visit  Medication Sig Dispense Refill   albuterol (VENTOLIN HFA) 108 (90 Base) MCG/ACT inhaler Inhale 1-2 puffs into the lungs every 6 (six) hours as needed for wheezing or shortness of breath. 18 g 2   aspirin 81 MG tablet Take 81 mg by mouth daily.     furosemide (LASIX) 20 MG tablet TAKE 1 TABLET BY MOUTH EVERY DAY 90  tablet 2   losartan (COZAAR) 100 MG tablet Take 1 tablet (100 mg total) by mouth daily. 90 tablet 3   metoprolol succinate (TOPROL-XL) 100 MG 24 hr tablet TAKE 1 TABLET BY MOUTH EVERY DAY 90 tablet 3   nitroGLYCERIN (NITROSTAT) 0.4 MG SL tablet Place 1 tablet (0.4 mg total) under the tongue every 5 (five) minutes as needed for chest pain. 25 tablet 3   rosuvastatin (CRESTOR) 20 MG tablet TAKE 1 TABLET BY MOUTH EVERY DAY 90 tablet 3   No facility-administered medications prior to visit.    Allergies  Allergen Reactions   Morphine     Reaction unknown    Review of Systems  Musculoskeletal:        (+) right shoulder pain  (+) limited range of motion in right arm      Objective:    Physical Exam Constitutional:      General: He is not in acute distress.    Appearance: Normal appearance. He is not ill-appearing.  HENT:     Head: Normocephalic and atraumatic.     Right Ear: External ear normal.     Left Ear: External ear normal.  Eyes:     Extraocular Movements: Extraocular movements intact.     Pupils: Pupils are equal, round, and reactive to light.  Cardiovascular:     Rate and Rhythm: Normal rate and regular rhythm.     Heart sounds: Normal heart sounds. No murmur heard.   No gallop.  Pulmonary:     Effort: Pulmonary effort is normal. No respiratory distress.     Breath sounds: Normal breath sounds. No wheezing or rales.  Musculoskeletal:     Right shoulder: Decreased range of motion.     Comments: Left shoulder is without swelling or tenderness to palpation. Full passive ROM. Difficulty with extension and abduction due to pain  Skin:    General: Skin is warm and dry.  Neurological:     Mental Status: He is alert and oriented to person, place, and time.  Psychiatric:        Behavior: Behavior normal.        Judgment: Judgment normal.    BP (!) 111/59 (BP Location: Right Arm, Patient Position: Sitting, Cuff Size: Large)   Pulse 62   Temp 97.9 F (36.6 C) (Oral)    Resp 16   Wt (!) 301 lb (136.5 kg)   SpO2 96%   BMI 44.45 kg/m  Wt Readings  from Last 3 Encounters:  07/10/21 (!) 301 lb (136.5 kg)  02/19/21 290 lb (131.5 kg)  01/07/20 278 lb 2 oz (126.2 kg)       Assessment & Plan:   Problem List Items Addressed This Visit       Unprioritized   Left shoulder pain - Primary    Acute on chronic.  Suspect rotator cuff strain vs. Tear.  Will obtain x-ray for further evaluation. Refer to Orthopedics.  Recommended tylenol prn pain.      Relevant Orders   DG Shoulder Left   Ambulatory referral to Orthopedics   No orders of the defined types were placed in this encounter.   I, Brian Craze, NP, personally preformed the services described in this documentation.  All medical record entries made by the scribe were at my direction and in my presence.  I have reviewed the chart and discharge instructions (if applicable) and agree that the record reflects my personal performance and is accurate and complete. 07/10/2021  I,Brian Gross,acting as a scribe for Lemont Fillers, NP.,have documented all relevant documentation on the behalf of Lemont Fillers, NP,as directed by  Lemont Fillers, NP while in the presence of Lemont Fillers, NP.  Lemont Fillers, NP

## 2021-07-10 NOTE — Patient Instructions (Signed)
Please complete x-ray on the first floor. You should be contacted about your referral to the orthopedic doctor.

## 2021-07-10 NOTE — Assessment & Plan Note (Signed)
Acute on chronic.  Suspect rotator cuff strain vs. Tear.  Will obtain x-ray for further evaluation. Refer to Orthopedics.  Recommended tylenol prn pain.

## 2021-07-14 ENCOUNTER — Other Ambulatory Visit: Payer: Self-pay

## 2021-07-14 ENCOUNTER — Ambulatory Visit (INDEPENDENT_AMBULATORY_CARE_PROVIDER_SITE_OTHER): Payer: 59 | Admitting: Orthopaedic Surgery

## 2021-07-14 VITALS — Ht 69.0 in | Wt 301.0 lb

## 2021-07-14 DIAGNOSIS — M12812 Other specific arthropathies, not elsewhere classified, left shoulder: Secondary | ICD-10-CM

## 2021-07-14 DIAGNOSIS — M75102 Unspecified rotator cuff tear or rupture of left shoulder, not specified as traumatic: Secondary | ICD-10-CM

## 2021-07-14 DIAGNOSIS — M25512 Pain in left shoulder: Secondary | ICD-10-CM

## 2021-07-14 NOTE — Progress Notes (Signed)
Chief Complaint: Left shoulder pain     History of Present Illness:   Pain Score: 0/10 SANE: 60/100  Brian E Catherine Oak. is a 55 y.o. male RHD male with left shoulder pain going on now for approximately 1 week.  He states that he was reaching overhead and felt a pop and has subsequently not been able to abduct or move the left shoulder in any significant way.  Does have a history of left shoulder pain which was aggravated a year and a half prior when he fell out of a deer stand and landed on the shoulder.  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  The pain bothers him at night.  He has not been taking any pain medication.  He has not had any physical therapy.  At this time he is quite concerned that he is not able to abduct the shoulder in any capacity and he cannot even use it for grooming.    Surgical History:   None  PMH/PSH/Family History/Social History/Meds/Allergies:    Past Medical History:  Diagnosis Date   Asthma    Borderline diabetes    CAD (coronary artery disease)    a. s/p anterior STEMI in 09/2011 with aspiration of occluded LAD with DES placed at that time, CTO of RCA noted. 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 EF at 30% by cath in 09/2011. Has refused echos in the past.    MI (myocardial infarction) (HCC)    Obesity    SOB (shortness of breath)    Past Surgical History:  Procedure Laterality Date   CHOLECYSTECTOMY     LEFT HEART CATHETERIZATION WITH CORONARY ANGIOGRAM N/A 10/18/2011   Procedure: LEFT HEART CATHETERIZATION WITH CORONARY ANGIOGRAM;  Surgeon: Corky Crafts, MD;  Location: [facility] CATH LAB;  Service: Cardiovascular;  Laterality: N/A;   LUNG BIOPSY  1990s   PERCUTANEOUS CORONARY STENT  INTERVENTION (PCI-S) N/A 10/18/2011   Procedure: PERCUTANEOUS CORONARY STENT INTERVENTION (PCI-S);  Surgeon: Corky Crafts, MD;  Location: [facility] CATH LAB;  Service: Cardiovascular;  Laterality: N/A;   Social History   Socioeconomic History   Marital status: Married    Spouse name: Not on file   Number of children: 1   Years of education: Not on file   Highest education level: Not on file  Occupational History   Occupation: Flooring  Tobacco Use   Smoking status: Former    Types: Cigarettes    Quit date: 10/17/1989    Years since quitting: 31.7   Smokeless tobacco: Never  Vaping Use   Vaping Use: Never used  Substance and Sexual Activity   Alcohol use: Yes    Alcohol/week: 1.0 standard drink    Types: 1 Cans of beer per week   Drug use: Yes    Frequency: 2.0 times per week    Types: Marijuana   Sexual activity: Not on file  Other Topics Concern   Not on file  Social History Narrative  Social Determinants of Health   Financial Resource Strain: Not on file  Food Insecurity: Not on file  Transportation Needs: Not on file  Physical Activity: Not on file  Stress: Not on file  Social Connections: Not on file   Family History  Problem Relation Age of Onset   Coronary artery disease Father 57       CABG   Heart attack Father    Healthy Mother    Colon cancer Neg Hx    Prostate cancer Neg Hx    Allergies  Allergen Reactions   Morphine     Reaction unknown   Current Outpatient Medications  Medication Sig Dispense Refill   albuterol (VENTOLIN HFA) 108 (90 Base) MCG/ACT inhaler Inhale 1-2 puffs into the lungs every 6 (six) hours as needed for wheezing or shortness of breath. 18 g 2   aspirin 81 MG tablet Take 81 mg by mouth daily.     furosemide (LASIX) 20 MG tablet TAKE 1 TABLET BY MOUTH EVERY DAY 90 tablet 2   losartan (COZAAR) 100 MG tablet Take 1 tablet (100 mg total) by mouth daily. 90 tablet 3   metoprolol succinate (TOPROL-XL) 100 MG 24 hr  tablet TAKE 1 TABLET BY MOUTH EVERY DAY 90 tablet 3   nitroGLYCERIN (NITROSTAT) 0.4 MG SL tablet Place 1 tablet (0.4 mg total) under the tongue every 5 (five) minutes as needed for chest pain. 25 tablet 3   rosuvastatin (CRESTOR) 20 MG tablet TAKE 1 TABLET BY MOUTH EVERY DAY 90 tablet 3   No current facility-administered medications for this visit.   No results found.  Review of Systems:   A ROS was performed including pertinent positives and negatives as documented in the HPI.  Physical Exam :   Constitutional: NAD and appears stated age Neurological: Alert and oriented Psych: Appropriate affect and cooperative Height 5\' 9"  (1.753 m), weight (!) 301 lb (136.5 kg).   Comprehensive Musculoskeletal Exam:    Musculoskeletal Exam    Inspection Right Left  Skin No atrophy or winging No atrophy or winging  Palpation    Tenderness None Lateral shoulder  Range of Motion    Flexion (passive) 170 150  Flexion (active) 170 20  Abduction 170 20  ER at the side 70 30  Can reach behind back to T12 L1  Strength     5/5 4/5 supra and infra  Special Tests    Pseudoparalytic No No  Neurologic    Fires PIN, radial, median, ulnar, musculocutaneous, axillary, suprascapular, long thoracic, and spinal accessory innervated muscles. No abnormal sensibility  Vascular/Lymphatic    Radial Pulse 2+ 2+  Cervical Exam    Patient has symmetric cervical range of motion with negative Spurling's test.  Special Test: Negative belly press     Imaging:   Xray (3 views left shoulder): Normal  I personally reviewed and interpreted the radiographs.   Assessment:   55 year old male with left shoulder pain and inability to abduct the shoulder.  I have told him that at this time I am concerned for any acute rotator cuff tear.  As such I do not believe there is a role for conservative management and I believe an MRI for further work-up.  I will plan to order an MRI to be done soon as possible and he will  follow after discuss results.  Plan :    -Return to clinic after MRIs to discuss results  I believe that advance imaging in the form of an MRI  is indicated for the following reasons: -Xrays images were obtained and not diagnosti -The following worrisome symptoms are present on history and exam: Inability to actively move the shoulder, felt a pop tear in the shoulder - MRI is required to assist in specific surgical planning     I personally saw and evaluated the patient, and participated in the management and treatment plan.  Huel Cote, MD Attending Physician, Orthopedic Surgery  This document was dictated using Dragon voice recognition software. A reasonable attempt at proof reading has been made to minimize errors.

## 2021-07-22 ENCOUNTER — Other Ambulatory Visit: Payer: Self-pay

## 2021-07-22 ENCOUNTER — Ambulatory Visit
Admission: RE | Admit: 2021-07-22 | Discharge: 2021-07-22 | Disposition: A | Discharge status: Home / Self Care | Payer: 59 | Source: Ambulatory Visit | Attending: Orthopaedic Surgery | Admitting: Orthopaedic Surgery

## 2021-07-22 DIAGNOSIS — M25512 Pain in left shoulder: Secondary | ICD-10-CM

## 2021-08-04 ENCOUNTER — Other Ambulatory Visit: Payer: Self-pay

## 2021-08-04 ENCOUNTER — Other Ambulatory Visit (HOSPITAL_BASED_OUTPATIENT_CLINIC_OR_DEPARTMENT_OTHER): Payer: Self-pay

## 2021-08-04 ENCOUNTER — Ambulatory Visit (INDEPENDENT_AMBULATORY_CARE_PROVIDER_SITE_OTHER): Payer: 59 | Admitting: Orthopaedic Surgery

## 2021-08-04 ENCOUNTER — Ambulatory Visit (HOSPITAL_BASED_OUTPATIENT_CLINIC_OR_DEPARTMENT_OTHER): Payer: Self-pay | Admitting: Orthopaedic Surgery

## 2021-08-04 VITALS — Ht 69.0 in | Wt 301.0 lb

## 2021-08-04 DIAGNOSIS — M75102 Unspecified rotator cuff tear or rupture of left shoulder, not specified as traumatic: Secondary | ICD-10-CM

## 2021-08-04 DIAGNOSIS — M12812 Other specific arthropathies, not elsewhere classified, left shoulder: Secondary | ICD-10-CM

## 2021-08-04 DIAGNOSIS — M25512 Pain in left shoulder: Secondary | ICD-10-CM

## 2021-08-04 MED ORDER — OXYCODONE HCL 5 MG PO TABS
5.0000 mg | ORAL_TABLET | ORAL | 0 refills | Status: DC | PRN
  Filled 2021-08-04: qty 20, 4d supply, fill #0

## 2021-08-04 MED ORDER — IBUPROFEN 800 MG PO TABS: 800.0000 mg | ORAL_TABLET | Freq: Three times a day (TID) | ORAL | 0 refills | Status: DC

## 2021-08-04 MED ORDER — ASPIRIN EC 325 MG PO TBEC: 325.0000 mg | DELAYED_RELEASE_TABLET | Freq: Every day | ORAL | 0 refills | Status: DC

## 2021-08-04 MED ORDER — OXYCODONE HCL 5 MG PO CAPS: 5.0000 mg | ORAL_CAPSULE | ORAL | 0 refills | Status: DC | PRN

## 2021-08-04 MED ORDER — ASPIRIN EC 325 MG PO TBEC
325.0000 mg | DELAYED_RELEASE_TABLET | Freq: Every day | ORAL | 0 refills | Status: DC
  Filled 2021-08-04: qty 30, 30d supply, fill #0

## 2021-08-04 MED ORDER — IBUPROFEN 800 MG PO TABS
800.0000 mg | ORAL_TABLET | Freq: Three times a day (TID) | ORAL | 0 refills | Status: AC
  Filled 2021-08-04: qty 30, 10d supply, fill #0

## 2021-08-04 NOTE — Progress Notes (Signed)
                               Chief Complaint: Left shoulder pain     History of Present Illness:   08/04/2021: Follow-up today for MRI review of his left shoulder.  He continues to have pain and limited motion about the left shoulder.   Pain Score: 0/10 SANE: 60/100  Brian E Vogl Sr. is a 55 y.o. male RHD male with left shoulder pain going on now for approximately 1 week.  He states that he was reaching overhead and felt a pop and has subsequently not been able to abduct or move the left shoulder in any significant way.  Does have a history of left shoulder pain which was aggravated a year and a half prior when he fell out of a deer stand and landed on the shoulder.  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  The pain bothers him at night.  He has not been taking any pain medication.  He has not had any physical therapy.  At this time he is quite concerned that he is not able to abduct the shoulder in any capacity and he cannot even use it for grooming.    Surgical History:   None  PMH/PSH/Family History/Social History/Meds/Allergies:    Past Medical History:  Diagnosis Date   Asthma    Borderline diabetes    CAD (coronary artery disease)    a. s/p anterior STEMI in 09/2011 with aspiration of occluded LAD with DES placed at that time, CTO of RCA noted. EF at 30%   Chest pain    CHF (congestive heart failure) (HCC)    Gout    Granulomatosis    chronic granulomatosis (dx at age 23 @ [facility])   Ischemic cardiomyopathy    a. EF at 30% by cath in 09/2011. Has refused echos in the past.    MI (myocardial infarction) (HCC)    Obesity    SOB (shortness of breath)    Past Surgical History:  Procedure Laterality Date   CHOLECYSTECTOMY     LEFT HEART CATHETERIZATION WITH CORONARY ANGIOGRAM N/A 10/18/2011   Procedure: LEFT HEART CATHETERIZATION WITH CORONARY ANGIOGRAM;  Surgeon: Jayadeep S  Varanasi, MD;  Location: [facility] CATH LAB;  Service: Cardiovascular;  Laterality: N/A;   LUNG BIOPSY  1990s   PERCUTANEOUS CORONARY STENT INTERVENTION (PCI-S) N/A 10/18/2011   Procedure: PERCUTANEOUS CORONARY STENT INTERVENTION (PCI-S);  Surgeon: Jayadeep S Varanasi, MD;  Location: [facility] CATH LAB;  Service: Cardiovascular;  Laterality: N/A;   Social History   Socioeconomic History   Marital status: Married    Spouse name: Not on file   Number of children: 1   Years of education: Not on file   Highest education level: Not on file  Occupational History   Occupation: Flooring  Tobacco Use   Smoking status: Former    Types: Cigarettes    Quit date: 10/17/1989    Years since quitting: 31.8   Smokeless tobacco: Never  Vaping Use   Vaping Use: Never used  Substance and Sexual Activity   Alcohol use: Yes    Alcohol/week: 1.0 standard drink    Types: 1 Cans of beer per week   Drug use: Yes    Frequency: 2.0 times per week    Types: Marijuana   Sexual activity: Not   on file  Other Topics Concern   Not on file  Social History Narrative            Social Determinants of Health   Financial Resource Strain: Not on file  Food Insecurity: Not on file  Transportation Needs: Not on file  Physical Activity: Not on file  Stress: Not on file  Social Connections: Not on file   Family History  Problem Relation Age of Onset   Coronary artery disease Father 50       CABG   Heart attack Father    Healthy Mother    Colon cancer Neg Hx    Prostate cancer Neg Hx    Allergies  Allergen Reactions   Morphine     Reaction unknown   Current Outpatient Medications  Medication Sig Dispense Refill   albuterol (VENTOLIN HFA) 108 (90 Base) MCG/ACT inhaler Inhale 1-2 puffs into the lungs every 6 (six) hours as needed for wheezing or shortness of breath. 18 g 2   aspirin 81 MG tablet Take 81 mg by mouth daily.     aspirin EC 325 MG tablet Take 1 tablet (325 mg total) by mouth daily. 30 tablet 0    furosemide (LASIX) 20 MG tablet TAKE 1 TABLET BY MOUTH EVERY DAY 90 tablet 2   ibuprofen (ADVIL) 800 MG tablet Take 1 tablet (800 mg total) by mouth every 8 (eight) hours for 10 days. Please take with food, please alternate with acetaminophen 30 tablet 0   losartan (COZAAR) 100 MG tablet Take 1 tablet (100 mg total) by mouth daily. 90 tablet 3   metoprolol succinate (TOPROL-XL) 100 MG 24 hr tablet TAKE 1 TABLET BY MOUTH EVERY DAY 90 tablet 3   nitroGLYCERIN (NITROSTAT) 0.4 MG SL tablet Place 1 tablet (0.4 mg total) under the tongue every 5 (five) minutes as needed for chest pain. 25 tablet 3   oxyCODONE (OXY IR/ROXICODONE) 5 MG immediate release tablet Take 1 tablet (5 mg total) by mouth every 4 (four) hours as needed (severe pain). 20 tablet 0   rosuvastatin (CRESTOR) 20 MG tablet TAKE 1 TABLET BY MOUTH EVERY DAY 90 tablet 3   No current facility-administered medications for this visit.   No results found.  Review of Systems:   A ROS was performed including pertinent positives and negatives as documented in the HPI.  Physical Exam :   Constitutional: NAD and appears stated age Neurological: Alert and oriented Psych: Appropriate affect and cooperative Height 5' 9" (1.753 m), weight (!) 301 lb (136.5 kg).   Comprehensive Musculoskeletal Exam:    Musculoskeletal Exam    Inspection Right Left  Skin No atrophy or winging No atrophy or winging  Palpation    Tenderness None Lateral shoulder  Range of Motion    Flexion (passive) 170 150  Flexion (active) 170 20  Abduction 170 20  ER at the side 70 30  Can reach behind back to T12 L1  Strength     5/5 4/5 supra and infra  Special Tests    Pseudoparalytic No No  Neurologic    Fires PIN, radial, median, ulnar, musculocutaneous, axillary, suprascapular, long thoracic, and spinal accessory innervated muscles. No abnormal sensibility  Vascular/Lymphatic    Radial Pulse 2+ 2+  Cervical Exam    Patient has symmetric cervical range of  motion with negative Spurling's test.  Special Test: Negative belly press     Imaging:   Xray (3 views left shoulder): Normal  MRI left shoulder: There   is a complete rotator cuff Tear with significant retraction and muscle degeneration involving the supra and infraspinatus  I personally reviewed and interpreted the radiographs.   Assessment:   55 year old male with left shoulder pain in the setting of a massive rotator cuff repair.  I described that ultimately that I do not believe this rotator cuff to be repairable.  There is significant fatty atrophy on the T1 sagittal slice.  As result I discussed his treatment options would be to continue to live with the shoulder as is which he has not willing to do.  I did describe that the next option would be a superior capsular reconstruction which would require a longer rehabilitation.  He understands this.  He would like to proceed with this.  We did describe the long-term consequences of not proceeding with any type of surgery and we discussed the risk for proximal humeral migration.  He understands this and would like to proceed with surgery  Plan :    -Plan for left shoulder arthroscopy with superior capsular reconstruction -Sling provided for postop -Postoperative medications provided   I personally saw and evaluated the patient, and participated in the management and treatment plan.  Huel Cote, MD Attending Physician, Orthopedic Surgery  This document was dictated using Dragon voice recognition software. A reasonable attempt at proof reading has been made to minimize errors.

## 2021-08-04 NOTE — H&P (View-Only) (Signed)
Chief Complaint: Left shoulder pain     History of Present Illness:   08/04/2021: Follow-up today for MRI review of his left shoulder.  He continues to have pain and limited motion about the left shoulder.   Pain Score: 0/10 SANE: 60/100  Brian Gross. is a 55 y.o. male RHD male with left shoulder pain going on now for approximately 1 week.  He states that he was reaching overhead and felt a pop and has subsequently not been able to abduct or move the left shoulder in any significant way.  Does have a history of left shoulder pain which was aggravated a year and a half prior when he fell out of a deer stand and landed on the shoulder.  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  The pain bothers him at night.  He has not been taking any pain medication.  He has not had any physical therapy.  At this time he is quite concerned that he is not able to abduct the shoulder in any capacity and he cannot even use it for grooming.    Surgical History:   None  PMH/PSH/Family History/Social History/Meds/Allergies:    Past Medical History:  Diagnosis Date   Asthma    Borderline diabetes    CAD (coronary artery disease)    a. s/p anterior STEMI in 09/2011 with aspiration of occluded LAD with DES placed at that time, CTO of RCA noted. EF at 30%   Chest pain    CHF (congestive heart failure) (HCC)    Gout    Granulomatosis    chronic granulomatosis (dx at age 57 @ [facility])   Ischemic cardiomyopathy    a. EF at 30% by cath in 09/2011. Has refused echos in the past.    MI (myocardial infarction) (HCC)    Obesity    SOB (shortness of breath)    Past Surgical History:  Procedure Laterality Date   CHOLECYSTECTOMY     LEFT HEART CATHETERIZATION WITH CORONARY ANGIOGRAM N/A 10/18/2011   Procedure: LEFT HEART CATHETERIZATION WITH CORONARY ANGIOGRAM;  Surgeon: Corky Crafts, MD;  Location: [facility] CATH LAB;  Service: Cardiovascular;  Laterality: N/A;   LUNG BIOPSY  1990s   PERCUTANEOUS CORONARY STENT INTERVENTION (PCI-S) N/A 10/18/2011   Procedure: PERCUTANEOUS CORONARY STENT INTERVENTION (PCI-S);  Surgeon: Corky Crafts, MD;  Location: [facility] CATH LAB;  Service: Cardiovascular;  Laterality: N/A;   Social History   Socioeconomic History   Marital status: Married    Spouse name: Not on file   Number of children: 1   Years of education: Not on file   Highest education level: Not on file  Occupational History   Occupation: Flooring  Tobacco Use   Smoking status: Former    Types: Cigarettes    Quit date: 10/17/1989    Years since quitting: 31.8   Smokeless tobacco: Never  Vaping Use   Vaping Use: Never used  Substance and Sexual Activity   Alcohol use: Yes    Alcohol/week: 1.0 standard drink    Types: 1 Cans of beer per week   Drug use: Yes    Frequency: 2.0 times per week    Types: Marijuana   Sexual activity: Not  on file  Other Topics Concern   Not on file  Social History Narrative            Social Determinants of Health   Financial Resource Strain: Not on file  Food Insecurity: Not on file  Transportation Needs: Not on file  Physical Activity: Not on file  Stress: Not on file  Social Connections: Not on file   Family History  Problem Relation Age of Onset   Coronary artery disease Father 19       CABG   Heart attack Father    Healthy Mother    Colon cancer Neg Hx    Prostate cancer Neg Hx    Allergies  Allergen Reactions   Morphine     Reaction unknown   Current Outpatient Medications  Medication Sig Dispense Refill   albuterol (VENTOLIN HFA) 108 (90 Base) MCG/ACT inhaler Inhale 1-2 puffs into the lungs every 6 (six) hours as needed for wheezing or shortness of breath. 18 g 2   aspirin 81 MG tablet Take 81 mg by mouth daily.     aspirin EC 325 MG tablet Take 1 tablet (325 mg total) by mouth daily. 30 tablet 0    furosemide (LASIX) 20 MG tablet TAKE 1 TABLET BY MOUTH EVERY DAY 90 tablet 2   ibuprofen (ADVIL) 800 MG tablet Take 1 tablet (800 mg total) by mouth every 8 (eight) hours for 10 days. Please take with food, please alternate with acetaminophen 30 tablet 0   losartan (COZAAR) 100 MG tablet Take 1 tablet (100 mg total) by mouth daily. 90 tablet 3   metoprolol succinate (TOPROL-XL) 100 MG 24 hr tablet TAKE 1 TABLET BY MOUTH EVERY DAY 90 tablet 3   nitroGLYCERIN (NITROSTAT) 0.4 MG SL tablet Place 1 tablet (0.4 mg total) under the tongue every 5 (five) minutes as needed for chest pain. 25 tablet 3   oxyCODONE (OXY IR/ROXICODONE) 5 MG immediate release tablet Take 1 tablet (5 mg total) by mouth every 4 (four) hours as needed (severe pain). 20 tablet 0   rosuvastatin (CRESTOR) 20 MG tablet TAKE 1 TABLET BY MOUTH EVERY DAY 90 tablet 3   No current facility-administered medications for this visit.   No results found.  Review of Systems:   A ROS was performed including pertinent positives and negatives as documented in the HPI.  Physical Exam :   Constitutional: NAD and appears stated age Neurological: Alert and oriented Psych: Appropriate affect and cooperative Height 5\' 9"  (1.753 m), weight (!) 301 lb (136.5 kg).   Comprehensive Musculoskeletal Exam:    Musculoskeletal Exam    Inspection Right Left  Skin No atrophy or winging No atrophy or winging  Palpation    Tenderness None Lateral shoulder  Range of Motion    Flexion (passive) 170 150  Flexion (active) 170 20  Abduction 170 20  ER at the side 70 30  Can reach behind back to T12 L1  Strength     5/5 4/5 supra and infra  Special Tests    Pseudoparalytic No No  Neurologic    Fires PIN, radial, median, ulnar, musculocutaneous, axillary, suprascapular, long thoracic, and spinal accessory innervated muscles. No abnormal sensibility  Vascular/Lymphatic    Radial Pulse 2+ 2+  Cervical Exam    Patient has symmetric cervical range of  motion with negative Spurling's test.  Special Test: Negative belly press     Imaging:   Xray (3 views left shoulder): Normal  MRI left shoulder: There  is a complete rotator cuff Tear with significant retraction and muscle degeneration involving the supra and infraspinatus  I personally reviewed and interpreted the radiographs.   Assessment:   55 year old male with left shoulder pain in the setting of a massive rotator cuff repair.  I described that ultimately that I do not believe this rotator cuff to be repairable.  There is significant fatty atrophy on the T1 sagittal slice.  As result I discussed his treatment options would be to continue to live with the shoulder as is which he has not willing to do.  I did describe that the next option would be a superior capsular reconstruction which would require a longer rehabilitation.  He understands this.  He would like to proceed with this.  We did describe the long-term consequences of not proceeding with any type of surgery and we discussed the risk for proximal humeral migration.  He understands this and would like to proceed with surgery  Plan :    -Plan for left shoulder arthroscopy with superior capsular reconstruction -Sling provided for postop -Postoperative medications provided   I personally saw and evaluated the patient, and participated in the management and treatment plan.  Huel Cote, MD Attending Physician, Orthopedic Surgery  This document was dictated using Dragon voice recognition software. A reasonable attempt at proof reading has been made to minimize errors.

## 2021-08-10 ENCOUNTER — Other Ambulatory Visit: Payer: Self-pay

## 2021-08-12 NOTE — Pre-Procedure Instructions (Signed)
Surgical Instructions    Your procedure is scheduled on Thursday, November 3rd.  Report to [facility] Main Entrance "A" at 5:30 A.M., then check in with the Admitting office.  Call this number if you have problems the morning of surgery:  [phone]   If you have any questions prior to your surgery date call [phone]: Open Monday-Friday 8am-4pm    Remember:  Do not eat after midnight the night before your surgery  You may drink clear liquids until 4:30 a.m. the morning of your surgery.   Clear liquids allowed are: Water, Non-Citrus Juices (without pulp), Carbonated Beverages, Clear Tea, Black Coffee Only, and Gatorade.   Enhanced Recovery after Surgery for Orthopedics Enhanced Recovery after Surgery is a protocol used to improve the stress on your body and your recovery after surgery.  Patient Instructions  The day of surgery (if you do NOT have diabetes):  Drink ONE (1) Pre-Surgery Clear Ensure by 4:30 a.m. the morning of surgery   This drink was given to you during your hospital  pre-op appointment visit. Nothing else to drink after completing the  Pre-Surgery Clear Ensure.         If you have questions, please contact your surgeon's office.     Take these medicines the morning of surgery with A SIP OF WATER  metoprolol succinate (TOPROL-XL)  rosuvastatin (CRESTOR)   As needed: acetaminophen (TYLENOL)  Albuterol inhaler-please bring inhaler with you to the hospital. nitroGLYCERIN (NITROSTAT)-please let a nurse know if you had to use this. oxyCODONE (OXY IR/ROXICODONE)   Follow your surgeon's instructions on when to stop Aspirin.  If no instructions were given by your surgeon then you will need to call the office to get those instructions.    As of today, STOP taking any Aleve, Naproxen, Ibuprofen, Motrin, Advil, Goody's, BC's, all herbal medications, fish oil, and all vitamins.                     Do NOT Smoke (Tobacco/Vaping) or drink Alcohol 24 hours prior to  your procedure.  If you use a CPAP at night, you may bring all equipment for your overnight stay.   Contacts, glasses, piercing's, hearing aid's, dentures or partials may not be worn into surgery, please bring cases for these belongings.    For patients admitted to the hospital, discharge time will be determined by your treatment team.   Patients discharged the day of surgery will not be allowed to drive home, and someone needs to stay with them for 24 hours.  NO VISITORS WILL BE ALLOWED IN PRE-OP WHERE PATIENTS GET READY FOR SURGERY.  ONLY 1 SUPPORT PERSON MAY BE PRESENT IN THE WAITING ROOM WHILE YOU ARE IN SURGERY.  IF YOU ARE TO BE ADMITTED, ONCE YOU ARE IN YOUR ROOM YOU WILL BE ALLOWED TWO (2) VISITORS.  Minor children may have two parents present. Special consideration for safety and communication needs will be reviewed on a case by case basis.   Special instructions:                                    [place]- Preparing for Total Shoulder Arthroplasty   Before surgery, you can play an important role. Because skin is not sterile, your skin needs to be as free of germs as possible. You can reduce the number of germs on your skin by using the following products. Benzoyl Peroxide  Gel Reduces the number of germs present on the skin Applied twice a day to shoulder area starting two days before surgery   Chlorhexidine Gluconate (CHG) Soap An antiseptic cleaner that kills germs and bonds with the skin to continue killing germs even after washing Used for showering the night before surgery and morning of surgery   Oral Hygiene is also important to reduce your risk of infection.                                    Remember - BRUSH YOUR TEETH THE MORNING OF SURGERY WITH YOUR REGULAR TOOTHPASTE  ==================================================================  Please follow these instructions carefully:  BENZOYL PEROXIDE 5% GEL  Please do not use if you have an allergy to benzoyl  peroxide.   If your skin becomes reddened/irritated stop using the benzoyl peroxide.  Starting two days before surgery, apply as follows: Apply benzoyl peroxide in the morning and at night. Apply after taking a shower. If you are not taking a shower clean entire shoulder front, back, and side along with the armpit with a clean wet washcloth.  Place a quarter-sized dollop on your shoulder and rub in thoroughly, making sure to cover the front, back, and side of your shoulder, along with the armpit.   2 days before ____ AM   ____ PM              1 day before ____ AM   ____ PM                             Do this twice a day for two days.  (Last application is the night before surgery, AFTER using the CHG soap as described below).  Do NOT apply benzoyl peroxide gel on the day of surgery.  CHLORHEXIDINE GLUCONATE (CHG) SOAP  Please do not use if you have an allergy to CHG or antibacterial soaps. If your skin becomes reddened/irritated stop using the CHG.   Do not shave (including legs and underarms) for at least 48 hours prior to first CHG shower. It is OK to shave your face.  Starting the night before surgery, use CHG soap as follows:  Shower the NIGHT BEFORE SURGERY and MORNING OF SURGERY with CHG.  If you choose to wash your hair, wash your hair first as usual with your normal shampoo.  After shampooing, rinse your hair and body thoroughly to remove the shampoo.  Use CHG as you would any other liquid soap.  You can apply CHG directly to the skin and wash gently with a scrungie or a clean washcloth.  Apply the CHG soap to your body ONLY FROM THE NECK DOWN.  Do not use on open wounds or open sores.  Avoid contact with your eyes, ears, mouth, and genitals (private parts).  Wash face and genitals (private parts) with your normal soap.  Wash thoroughly, paying special attention to the area where your surgery will be performed.  Thoroughly rinse your body with warm water from the neck  down.  DO NOT shower/wash with your normal soap after using and rinsing off the CHG soap.   Pat yourself dry with a CLEAN TOWEL.    Apply benzoyl peroxide.   Wear CLEAN PAJAMAS to bed the night before surgery; wear comfortable clothes the morning of surgery.  Place CLEAN SHEETS on your bed the night of your  first shower and DO NOT SLEEP WITH PETS.    Day of Surgery: Shower with CHG soap. Do not wear jewelry. Do not wear lotions, powders, colognes, or deodorant. Do not shave 48 hours prior to surgery.  Men may shave face and neck. Do not bring valuables to the hospital. [facility] is not responsible for any belongings or valuables. Wear Clean/Comfortable clothing the morning of surgery Remember to brush your teeth WITH YOUR REGULAR TOOTHPASTE.   Please read over the following fact sheets that you were given.   3 days prior to your procedure or After your COVID test   You are not required to quarantine however you are required to wear a well-fitting mask when you are out and around people not in your household. If your mask becomes wet or soiled, replace with a new one.   Wash your hands often with soap and water for 20 seconds or clean your hands with an alcohol-based hand sanitizer that contains at least 60% alcohol.   Do not share personal items.   Notify your provider:  o if you are in close contact with someone who has COVID  o or if you develop a fever of 100.4 or greater, sneezing, cough, sore throat, shortness of breath or body aches.

## 2021-08-13 ENCOUNTER — Other Ambulatory Visit: Payer: Self-pay

## 2021-08-13 ENCOUNTER — Encounter (HOSPITAL_COMMUNITY): Payer: Self-pay

## 2021-08-13 ENCOUNTER — Telehealth: Payer: Self-pay | Admitting: Orthopaedic Surgery

## 2021-08-13 ENCOUNTER — Telehealth: Payer: Self-pay

## 2021-08-13 ENCOUNTER — Telehealth: Payer: Self-pay | Admitting: Cardiology

## 2021-08-13 ENCOUNTER — Encounter (HOSPITAL_COMMUNITY)
Admission: RE | Admit: 2021-08-13 | Discharge: 2021-08-13 | Disposition: A | Discharge status: Home / Self Care | Payer: 59 | Source: Ambulatory Visit | Attending: Orthopaedic Surgery | Admitting: Orthopaedic Surgery

## 2021-08-13 VITALS — BP 127/80 | HR 59 | Temp 97.5°F | Resp 18 | Ht 69.0 in | Wt 300.8 lb

## 2021-08-13 DIAGNOSIS — I255 Ischemic cardiomyopathy: Secondary | ICD-10-CM | POA: Diagnosis not present

## 2021-08-13 DIAGNOSIS — I2510 Atherosclerotic heart disease of native coronary artery without angina pectoris: Secondary | ICD-10-CM | POA: Insufficient documentation

## 2021-08-13 DIAGNOSIS — Z01812 Encounter for preprocedural laboratory examination: Secondary | ICD-10-CM | POA: Diagnosis not present

## 2021-08-13 DIAGNOSIS — I252 Old myocardial infarction: Secondary | ICD-10-CM | POA: Diagnosis not present

## 2021-08-13 DIAGNOSIS — Z955 Presence of coronary angioplasty implant and graft: Secondary | ICD-10-CM | POA: Insufficient documentation

## 2021-08-13 DIAGNOSIS — Z01818 Encounter for other preprocedural examination: Secondary | ICD-10-CM

## 2021-08-13 LAB — COMPREHENSIVE METABOLIC PANEL
ALT: 20 U/L (ref 0–44)
AST: 21 U/L (ref 15–41)
Albumin: 3.4 g/dL — ABNORMAL LOW (ref 3.5–5.0)
Alkaline Phosphatase: 84 U/L (ref 38–126)
Anion gap: 7 (ref 5–15)
BUN: 14 mg/dL (ref 6–20)
CO2: 27 mmol/L (ref 22–32)
Calcium: 8.9 mg/dL (ref 8.9–10.3)
Chloride: 100 mmol/L (ref 98–111)
Creatinine, Ser: 1.22 mg/dL (ref 0.61–1.24)
GFR, Estimated: >60 mL/min (ref >60)
Glucose, Bld: 101 mg/dL — ABNORMAL HIGH (ref 70–99)
Potassium: 4.3 mmol/L (ref 3.5–5.1)
Sodium: 134 mmol/L — ABNORMAL LOW (ref 135–145)
Total Bilirubin: 0.9 mg/dL (ref 0.3–1.2)
Total Protein: 7 g/dL (ref 6.5–8.1)

## 2021-08-13 LAB — CBC
HCT: 47.4 % (ref 39.0–52.0)
Hemoglobin: 15.7 g/dL (ref 13.0–17.0)
MCH: 30.2 pg (ref 26.0–34.0)
MCHC: 33.1 g/dL (ref 30.0–36.0)
MCV: 91.2 fL (ref 80.0–100.0)
Platelets: 203 10*3/uL (ref 150–400)
RBC: 5.2 MIL/uL (ref 4.22–5.81)
RDW: 11.9 % (ref 11.5–15.5)
WBC: 9.7 10*3/uL (ref 4.0–10.5)
nRBC: 0 % (ref 0.0–0.2)

## 2021-08-13 NOTE — Telephone Encounter (Signed)
Spoke with patient of Dr. Antoine Poche   Advised we have not yet received a clearance request fax from Dr. Serena Croissant office  Advised this office can send a note thru the charting system if needed  Would need details about anesthesia, requirements for meds to be held  He said he would call back with the name of the clinical team at this office, so that I could reach out to them also  Will await call   Routed to MD/RN as FYI, even though formal clearance request is pending, surgery is next week

## 2021-08-13 NOTE — Progress Notes (Signed)
   08/13/21 0919  OBSTRUCTIVE SLEEP APNEA  Have you ever been diagnosed with sleep apnea through a sleep study? No  Do you snore loudly (loud enough to be heard through closed doors)?  1  Do you often feel tired, fatigued, or sleepy during the daytime (such as falling asleep during driving or talking to someone)? 0  Has anyone observed you stop breathing during your sleep? 1  Do you have, or are you being treated for high blood pressure? 1  BMI more than 35 kg/m2? 1  Age > 50 (1-yes) 1  Neck circumference greater than:Male 16 inches or larger, Male 17inches or larger? 1  Male Gender (Yes=1) 1  Obstructive Sleep Apnea Score 7  Score 5 or greater  Results sent to PCP

## 2021-08-13 NOTE — Progress Notes (Addendum)
PCP - Dr. Willow Ora Cardiologist - Dr. Antoine Poche   Chest x-ray - n/a EKG - 02/19/21 Stress Test - 03/15/17 ECHO - denies. Multiple attempts made to schedule an ECHO per notes in Epic. Pt has refused them in the past.  Cardiac Cath - 09/2011; 1 drug eluding stent placed.   Sleep Study - denies. Stop Bang score 7. Results routed to PCP. CPAP - denies  Blood Thinner Instructions: n/a Aspirin Instructions:If no instructions were given by your surgeon then you will need to call the office to get those instructions. Pt to call Dr. Serena Croissant office.   ERAS Protcol - PRE-SURGERY Ensure or G2-   COVID TEST-not indicated. Ambulatory surgery.    Anesthesia review: Yes, Hx of CAD. Antionette Poles PA-C aware. Pt has not been seen since 02/19/21 and according to Dr. Jenene Slicker note, pt may need an ECHO. No cardiac clearance has been obtained. James Burn PA-C will be following up with this.   Patient denies shortness of breath, fever, cough and chest pain at PAT appointment   All instructions explained to the patient, with a verbal understanding of the material. Patient agrees to go over the instructions while at home for a better understanding. Patient also instructed to self quarantine after being tested for COVID-19. The opportunity to ask questions was provided.

## 2021-08-13 NOTE — Telephone Encounter (Signed)
Pt called regarding his surgery clearance. He is stating that his PCP has yet to receive any information regarding his surgery. Please advise

## 2021-08-13 NOTE — Telephone Encounter (Signed)
Patient is scheduled 7:30am  08-20-21 at [facility] for a left shoulder arthroscopy with superior capsular reconstruction (2.5 hours)   Brian Gross with anesthesia at [facility] stay called regarding this patient's cardiac history.  Patient has a stent and history of heart failure.  Patient was last seen by his cardiologist-Dr. Antoine Poche early May of this year. According to Epic notes, attempts were made to schedule ECHO, however patient has not followed through, most likely due to financial hardship. Considering this patient's cardiac history,  Dr. Antoine Poche will need to weigh in with risk stratification/clearance to make sure patient is optimized for this surgery. Also perioperative instructions for ASA 81mg  will need be obtained from patient's cardiologist. A request for surgical clearance form has been faxed to Dr. today with a receipt of successful transmission and we are awaiting a response.  I have spoken with this patient and he is aware and understands clearance will be necessary to carry out surgery.  I have encouraged patient to call Dr. Antoine Poche office ASAP to determine if a follow up visit will be necessary in order to secure this surgery date of November 3rd.

## 2021-08-13 NOTE — Telephone Encounter (Signed)
pt is having rotator cup surgery and is needing medical and pharmacy clearance.

## 2021-08-14 NOTE — Progress Notes (Signed)
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  Last seen by Dr. Antoine Poche 02/19/2021, stable at that time, recommended continue medical therapy.  Cardiac clearance per telephone encounter 08/14/2021, "Chart reviewed as part of pre-operative protocol coverage. Patient was contacted 08/14/2021 in reference to pre-operative risk assessment for pending surgery as outlined below.  Brian E Avalos Sr. was last seen on 02/19/21 by Dr. Antoine Poche.  Since that day, Mivaan E Demyan Fugate. has done he has done fine from a cardiac standpoint. He has some DOE which is chronic and unchanged in recent months. He can complete 4 METs without anginal complaints. Therefore, based on ACC/AHA guidelines, the patient would be at acceptable risk for the planned procedure without further cardiovascular testing."  No history of OSA, however STOP-BANG score was 7 at PAT visit, indicating high risk for OSA.  Preop labs reviewed, unremarkable.  EKG 02/19/2021: NSR.  Rate 65.  Low voltage QRS.  Anterolateral infarct, age undetermined.  Nuclear stress 03/15/2017: The left ventricular ejection fraction is moderately decreased (30-44%). Nuclear stress EF: 35%. No T wave inversion was noted during stress. There was no ST segment deviation noted during stress. Defect 1: There is a large defect of severe severity. Findings consistent with prior myocardial infarction. This is a high risk study.   Large size, severe intensity fixed anterior, anteroseptal and apical wall perfusion defect consistent with scar. LVEF 35% with mid to distal anterior, apcial and inferoapical akinesis. This is a high risk study (given size of defect and EF 35%), however, no significant reversible ischemia is noted.  Cath/PCI 10/18/2011: IMPRESSIONS:   Normal left main coronary artery.  Mild disease in the circumflex system. Occluded LAD  which was successfully stented with a drug-eluting stent to the mid vessel.  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  LVEDP 30 mmHg.  Ejection fraction 30 %.   RECOMMENDATION:  The patient will be watched in the ICU.  He'll need aggressive risk factor modification.  He'll need dual antiplatelet therapy for at least a year.  We'll start beta blocker and statin.  He will benefit from weight loss.   Zannie Cove [facility] Short Stay Center/Anesthesiology Phone [phone] 08/14/2021 8:42 AM

## 2021-08-14 NOTE — Telephone Encounter (Signed)
   [facility] Pre-operative Risk Assessment    Patient Name: Brian Gross.  DOB: 1966-07-11 MRN: [id number]  Request for surgical clearance:  What type of surgery is being performed? Left shoulder arthoplasty  When is this surgery scheduled? 08/20/2021  What type of clearance is required (medical clearance vs. Pharmacy clearance to hold med vs. Both)? Both   Are there any medications that need to be held prior to surgery and how long? ASA 33m  Practice name and name of physician performing surgery? Dr. SVanetta Mulderswith OMarga Hoots What is the office phone number? [phone]  7.   What is the office fax number? [phone] 8.   Anesthesia type (None, local, MAC, general) ? Regional    ESheral ApleyM 08/14/2021, 7:33 AM  _________________________________________________________________   (provider comments below)

## 2021-08-14 NOTE — Telephone Encounter (Signed)
   Name: SHAVON ZENZ Sr.  DOB: 04-07-1966  MRN: [id number]   Primary Cardiologist: Rollene Rotunda, MD  Chart reviewed as part of pre-operative protocol coverage. Patient was contacted 08/14/2021 in reference to pre-operative risk assessment for pending surgery as outlined below.  Dalen E Hemm Sr. was last seen on 02/19/21 by Dr. Antoine Poche.  Since that day, Ceasar E Jarmaine Ehrler. has done he has done fine from a cardiac standpoint. He has some DOE which is chronic and unchanged in recent months. He can complete 4 METs without anginal complaints.  Therefore, based on ACC/AHA guidelines, the patient would be at acceptable risk for the planned procedure without further cardiovascular testing.   The patient was advised that if he develops new symptoms prior to surgery to contact our office to arrange for a follow-up visit, and he verbalized understanding.  Given history of CAD s/p PCI/DES to LAD in 2012, prefer patient remain on aspirin throughout the perioperative period.   I will route this recommendation to the requesting party via Epic fax function and remove from pre-op pool. Please call with questions.  Beatriz Stallion, PA-C 08/14/2021, 8:18 AM

## 2021-08-14 NOTE — Anesthesia Preprocedure Evaluation (Addendum)
Anesthesia Evaluation  Patient identified by MRN, date of birth, ID band Patient awake    Reviewed: Allergy & Precautions, NPO status , Patient's Chart, lab work & pertinent test results  Airway Mallampati: III  TM Distance: >3 FB Neck ROM: Full  Mouth opening: Limited Mouth Opening  Dental no notable dental hx. (+) Teeth Intact, Dental Advisory Given   Pulmonary asthma , former smoker,    Pulmonary exam normal breath sounds clear to auscultation       Cardiovascular + CAD, + Past MI, + Cardiac Stents and +CHF  Normal cardiovascular exam Rhythm:Regular Rate:Normal  TTE 2018 The left ventricular ejection fraction is moderately decreased (30-44%). Nuclear stress EF: 35%. No T wave inversion was noted during stress. There was no ST segment deviation noted during stress. Defect 1: There is a large defect of severe severity. Findings consistent with prior myocardial infarction. This is a high risk study.   Large size, severe intensity fixed anterior, anteroseptal and apical wall perfusion defect consistent with scar. LVEF 35% with mid to distal anterior, apcial and inferoapical akinesis. This is a high risk study (given size of defect and EF 35%), however, no significant reversible ischemia is noted.    Neuro/Psych negative neurological ROS  negative psych ROS   GI/Hepatic negative GI ROS, Neg liver ROS,   Endo/Other  Morbid obesity (BMI 44)  Renal/GU negative Renal ROS  negative genitourinary   Musculoskeletal negative musculoskeletal ROS (+)   Abdominal (+) + obese,   Peds  Hematology negative hematology ROS (+)   Anesthesia Other Findings complete rotator cuff tear left shoulder  Reproductive/Obstetrics                          Anesthesia Physical Anesthesia Plan  ASA: 3  Anesthesia Plan: General and Regional   Post-op Pain Management:  Regional for Post-op pain   Induction:  Intravenous  PONV Risk Score and Plan: 2 and Midazolam, Dexamethasone and Ondansetron  Airway Management Planned: Oral ETT  Additional Equipment:   Intra-op Plan:   Post-operative Plan: Extubation in OR  Informed Consent: I have reviewed the patients History and Physical, chart, labs and discussed the procedure including the risks, benefits and alternatives for the proposed anesthesia with the patient or authorized representative who has indicated his/her understanding and acceptance.     Dental advisory given  Plan Discussed with: CRNA  Anesthesia Plan Comments: ( )       Anesthesia Quick Evaluation

## 2021-08-18 ENCOUNTER — Telehealth: Payer: Self-pay | Admitting: Cardiology

## 2021-08-18 MED ORDER — METOPROLOL SUCCINATE ER 100 MG PO TB24
100.0000 mg | ORAL_TABLET | Freq: Every day | ORAL | 2 refills | Status: DC
Start: 2021-08-18 — End: 2022-05-03

## 2021-08-18 NOTE — Telephone Encounter (Signed)
*  STAT* If patient is at the pharmacy, call can be transferred to refill team.   1. Which medications need to be refilled? (please list name of each medication and dose if known)  metoprolol succinate (TOPROL-XL) 100 MG 24 hr tablet  2. Which pharmacy/location (including street and city if local pharmacy) is medication to be sent to? [id number] - [place], [place] - [address]  3. Do they need a 30 day or 90 day supply? 90 with refills

## 2021-08-20 ENCOUNTER — Ambulatory Visit (HOSPITAL_COMMUNITY)
Admission: RE | Admit: 2021-08-20 | Discharge: 2021-08-20 | Disposition: A | Discharge status: Home / Self Care | Payer: 59 | Source: Ambulatory Visit | Attending: Orthopaedic Surgery | Admitting: Orthopaedic Surgery

## 2021-08-20 ENCOUNTER — Encounter (HOSPITAL_COMMUNITY)
Admission: RE | Disposition: A | Discharge status: Home / Self Care | Payer: Self-pay | Source: Ambulatory Visit | Attending: Orthopaedic Surgery

## 2021-08-20 ENCOUNTER — Ambulatory Visit (HOSPITAL_COMMUNITY): Payer: 59 | Admitting: Physician Assistant

## 2021-08-20 ENCOUNTER — Encounter (HOSPITAL_COMMUNITY): Payer: Self-pay | Admitting: Orthopaedic Surgery

## 2021-08-20 ENCOUNTER — Ambulatory Visit (HOSPITAL_COMMUNITY): Payer: 59 | Admitting: Anesthesiology

## 2021-08-20 ENCOUNTER — Telehealth: Payer: Self-pay | Admitting: Orthopaedic Surgery

## 2021-08-20 DIAGNOSIS — M75122 Complete rotator cuff tear or rupture of left shoulder, not specified as traumatic: Secondary | ICD-10-CM | POA: Diagnosis not present

## 2021-08-20 DIAGNOSIS — Z7982 Long term (current) use of aspirin: Secondary | ICD-10-CM | POA: Diagnosis not present

## 2021-08-20 DIAGNOSIS — Z87891 Personal history of nicotine dependence: Secondary | ICD-10-CM | POA: Diagnosis not present

## 2021-08-20 DIAGNOSIS — M7552 Bursitis of left shoulder: Secondary | ICD-10-CM | POA: Diagnosis not present

## 2021-08-20 DIAGNOSIS — Z79899 Other long term (current) drug therapy: Secondary | ICD-10-CM | POA: Insufficient documentation

## 2021-08-20 DIAGNOSIS — M12812 Other specific arthropathies, not elsewhere classified, left shoulder: Secondary | ICD-10-CM | POA: Diagnosis not present

## 2021-08-20 DIAGNOSIS — M75102 Unspecified rotator cuff tear or rupture of left shoulder, not specified as traumatic: Secondary | ICD-10-CM

## 2021-08-20 DIAGNOSIS — Z955 Presence of coronary angioplasty implant and graft: Secondary | ICD-10-CM | POA: Diagnosis not present

## 2021-08-20 SURGERY — ARTHROSCOPY, SHOULDER, WITH SUPERIOR CAPSULE RECONSTRUCTION
Anesthesia: Regional | Laterality: Left

## 2021-08-20 MED ORDER — LIDOCAINE 2% (20 MG/ML) 5 ML SYRINGE
INTRAMUSCULAR | Status: DC | PRN
  Administered 2021-08-20: 20 mg via INTRAVENOUS

## 2021-08-20 MED ORDER — SUCCINYLCHOLINE CHLORIDE 200 MG/10ML IV SOSY
PREFILLED_SYRINGE | INTRAVENOUS | Status: AC
  Filled 2021-08-20: qty 10

## 2021-08-20 MED ORDER — CEFAZOLIN IN SODIUM CHLORIDE 3-0.9 GM/100ML-% IV SOLN: 3.0000 g | INTRAVENOUS | Status: DC

## 2021-08-20 MED ORDER — MIDAZOLAM HCL 2 MG/2ML IJ SOLN
INTRAMUSCULAR | Status: AC
  Filled 2021-08-20: qty 2

## 2021-08-20 MED ORDER — CHLORHEXIDINE GLUCONATE 0.12 % MT SOLN
OROMUCOSAL | Status: AC
  Administered 2021-08-20: 15 mL via OROMUCOSAL
  Filled 2021-08-20: qty 15

## 2021-08-20 MED ORDER — DEXAMETHASONE SODIUM PHOSPHATE 10 MG/ML IJ SOLN
INTRAMUSCULAR | Status: DC | PRN
  Administered 2021-08-20: 10 mg via INTRAVENOUS

## 2021-08-20 MED ORDER — TRANEXAMIC ACID-NACL 1000-0.7 MG/100ML-% IV SOLN
INTRAVENOUS | Status: AC
  Filled 2021-08-20: qty 100

## 2021-08-20 MED ORDER — MIDAZOLAM HCL 2 MG/2ML IJ SOLN
INTRAMUSCULAR | Status: DC | PRN
  Administered 2021-08-20: 2 mg via INTRAVENOUS

## 2021-08-20 MED ORDER — SODIUM CHLORIDE 0.9 % IR SOLN
Status: DC | PRN
  Administered 2021-08-20 (×4): 3000 mL

## 2021-08-20 MED ORDER — EPHEDRINE SULFATE-NACL 50-0.9 MG/10ML-% IV SOSY
PREFILLED_SYRINGE | INTRAVENOUS | Status: DC | PRN
  Administered 2021-08-20 (×2): 5 mg via INTRAVENOUS
  Administered 2021-08-20 (×4): 10 mg via INTRAVENOUS
  Administered 2021-08-20: 5 mg via INTRAVENOUS
  Administered 2021-08-20 (×2): 10 mg via INTRAVENOUS

## 2021-08-20 MED ORDER — ROCURONIUM BROMIDE 10 MG/ML (PF) SYRINGE
PREFILLED_SYRINGE | INTRAVENOUS | Status: AC
  Filled 2021-08-20: qty 10

## 2021-08-20 MED ORDER — FENTANYL CITRATE (PF) 100 MCG/2ML IJ SOLN: 25.0000 ug | INTRAMUSCULAR | Status: DC | PRN

## 2021-08-20 MED ORDER — DEXTROSE 5 % IV SOLN
INTRAVENOUS | Status: DC | PRN
  Administered 2021-08-20: 3 g via INTRAVENOUS

## 2021-08-20 MED ORDER — PROPOFOL 10 MG/ML IV BOLUS
INTRAVENOUS | Status: AC
  Filled 2021-08-20: qty 20

## 2021-08-20 MED ORDER — BUPIVACAINE-EPINEPHRINE (PF) 0.25% -1:200000 IJ SOLN
INTRAMUSCULAR | Status: AC
  Filled 2021-08-20: qty 30

## 2021-08-20 MED ORDER — EPHEDRINE 5 MG/ML INJ
INTRAVENOUS | Status: AC
  Filled 2021-08-20: qty 10

## 2021-08-20 MED ORDER — ALBUTEROL SULFATE HFA 108 (90 BASE) MCG/ACT IN AERS
INHALATION_SPRAY | RESPIRATORY_TRACT | Status: DC | PRN
  Administered 2021-08-20 (×2): 2 via RESPIRATORY_TRACT

## 2021-08-20 MED ORDER — ORAL CARE MOUTH RINSE: 15.0000 mL | Freq: Once | OROMUCOSAL | Status: AC

## 2021-08-20 MED ORDER — CEFAZOLIN IN SODIUM CHLORIDE 3-0.9 GM/100ML-% IV SOLN
INTRAVENOUS | Status: AC
  Filled 2021-08-20: qty 100

## 2021-08-20 MED ORDER — GLYCOPYRROLATE PF 0.2 MG/ML IJ SOSY
PREFILLED_SYRINGE | INTRAMUSCULAR | Status: DC | PRN
  Administered 2021-08-20: .2 mg via INTRAVENOUS

## 2021-08-20 MED ORDER — FENTANYL CITRATE (PF) 250 MCG/5ML IJ SOLN
INTRAMUSCULAR | Status: AC
  Filled 2021-08-20: qty 5

## 2021-08-20 MED ORDER — ACETAMINOPHEN 500 MG PO TABS: 1000.0000 mg | ORAL_TABLET | Freq: Once | ORAL | Status: AC

## 2021-08-20 MED ORDER — GABAPENTIN 300 MG PO CAPS
ORAL_CAPSULE | ORAL | Status: AC
  Administered 2021-08-20: 300 mg via ORAL
  Filled 2021-08-20: qty 1

## 2021-08-20 MED ORDER — FENTANYL CITRATE (PF) 250 MCG/5ML IJ SOLN
INTRAMUSCULAR | Status: DC | PRN
  Administered 2021-08-20: 50 ug via INTRAVENOUS

## 2021-08-20 MED ORDER — BUPIVACAINE HCL (PF) 0.25 % IJ SOLN
INTRAMUSCULAR | Status: DC | PRN
  Administered 2021-08-20: 7 mL

## 2021-08-20 MED ORDER — BUPIVACAINE LIPOSOME 1.3 % IJ SUSP
INTRAMUSCULAR | Status: DC | PRN
  Administered 2021-08-20: 10 mL via PERINEURAL

## 2021-08-20 MED ORDER — EPINEPHRINE PF 1 MG/ML IJ SOLN
INTRAMUSCULAR | Status: DC | PRN
  Administered 2021-08-20 (×2): 1 mg via INTRAMUSCULAR

## 2021-08-20 MED ORDER — BUPIVACAINE HCL (PF) 0.5 % IJ SOLN
INTRAMUSCULAR | Status: DC | PRN
  Administered 2021-08-20: 15 mL via PERINEURAL

## 2021-08-20 MED ORDER — CHLORHEXIDINE GLUCONATE 0.12 % MT SOLN
15.0000 mL | Freq: Once | OROMUCOSAL | Status: AC
Start: 2021-08-20 — End: 2021-08-20

## 2021-08-20 MED ORDER — DEXAMETHASONE SODIUM PHOSPHATE 10 MG/ML IJ SOLN
INTRAMUSCULAR | Status: AC
  Filled 2021-08-20: qty 1

## 2021-08-20 MED ORDER — ALBUTEROL SULFATE HFA 108 (90 BASE) MCG/ACT IN AERS
INHALATION_SPRAY | RESPIRATORY_TRACT | Status: AC
  Filled 2021-08-20: qty 6.7

## 2021-08-20 MED ORDER — GABAPENTIN 300 MG PO CAPS: 300.0000 mg | ORAL_CAPSULE | Freq: Once | ORAL | Status: AC

## 2021-08-20 MED ORDER — GLYCOPYRROLATE PF 0.2 MG/ML IJ SOSY
PREFILLED_SYRINGE | INTRAMUSCULAR | Status: AC
  Filled 2021-08-20: qty 1

## 2021-08-20 MED ORDER — ROCURONIUM BROMIDE 10 MG/ML (PF) SYRINGE
PREFILLED_SYRINGE | INTRAVENOUS | Status: DC | PRN
  Administered 2021-08-20: 20 mg via INTRAVENOUS
  Administered 2021-08-20: 70 mg via INTRAVENOUS

## 2021-08-20 MED ORDER — ONDANSETRON HCL 4 MG/2ML IJ SOLN
INTRAMUSCULAR | Status: AC
  Filled 2021-08-20: qty 2

## 2021-08-20 MED ORDER — ONDANSETRON HCL 4 MG/2ML IJ SOLN
INTRAMUSCULAR | Status: DC | PRN
  Administered 2021-08-20: 4 mg via INTRAVENOUS

## 2021-08-20 MED ORDER — LACTATED RINGERS IV SOLN: INTRAVENOUS | Status: DC

## 2021-08-20 MED ORDER — TRANEXAMIC ACID-NACL 1000-0.7 MG/100ML-% IV SOLN
1000.0000 mg | INTRAVENOUS | Status: AC
  Administered 2021-08-20: 1000 mg via INTRAVENOUS

## 2021-08-20 MED ORDER — SUCCINYLCHOLINE CHLORIDE 200 MG/10ML IV SOSY
PREFILLED_SYRINGE | INTRAVENOUS | Status: DC | PRN
Start: 2021-08-20 — End: 2021-08-20
  Administered 2021-08-20: 160 mg via INTRAVENOUS

## 2021-08-20 MED ORDER — PHENYLEPHRINE 40 MCG/ML (10ML) SYRINGE FOR IV PUSH (FOR BLOOD PRESSURE SUPPORT)
PREFILLED_SYRINGE | INTRAVENOUS | Status: DC | PRN
  Administered 2021-08-20 (×6): 40 ug via INTRAVENOUS

## 2021-08-20 MED ORDER — PROPOFOL 10 MG/ML IV BOLUS
INTRAVENOUS | Status: DC | PRN
  Administered 2021-08-20: 200 mg via INTRAVENOUS

## 2021-08-20 MED ORDER — SUGAMMADEX SODIUM 200 MG/2ML IV SOLN
INTRAVENOUS | Status: DC | PRN
  Administered 2021-08-20: 200 mg via INTRAVENOUS

## 2021-08-20 MED ORDER — EPINEPHRINE PF 1 MG/ML IJ SOLN
INTRAMUSCULAR | Status: AC
  Filled 2021-08-20: qty 2

## 2021-08-20 MED ORDER — LIDOCAINE 2% (20 MG/ML) 5 ML SYRINGE
INTRAMUSCULAR | Status: AC
  Filled 2021-08-20: qty 5

## 2021-08-20 MED ORDER — ACETAMINOPHEN 500 MG PO TABS
ORAL_TABLET | ORAL | Status: AC
  Administered 2021-08-20: 1000 mg via ORAL
  Filled 2021-08-20: qty 2

## 2021-08-20 MED ORDER — PHENYLEPHRINE HCL-NACL 20-0.9 MG/250ML-% IV SOLN
INTRAVENOUS | Status: DC | PRN
  Administered 2021-08-20: 100 ug/min via INTRAVENOUS

## 2021-08-20 SURGICAL SUPPLY — 91 items
AID PSTN UNV HD RSTRNT DISP (MISCELLANEOUS) ×1
ALCOHOL 70% 16 OZ (MISCELLANEOUS) ×2 IMPLANT
ANCH SUT 2 SUTTK 12X2.4 STRL (Anchor) ×3 IMPLANT
ANCH SUT 2 SWLK 19.1 CLS EYLT (Anchor) ×1 IMPLANT
ANCH SUT 2.6 FBRSTK 1.7 (Anchor) ×2 IMPLANT
ANCH SUT SWLK 19.1X4.75 (Anchor) ×2 IMPLANT
ANCHOR SUT BIO SW 4.75X19.1 (Anchor) ×2 IMPLANT
ANCHOR SUT FBRTK 2.6X1.7X2 (Anchor) ×2 IMPLANT
ANCHOR SUTURETAK 2.4X12 BIOC # (Anchor) ×3 IMPLANT
ANCHOR SWIVELOCK BIO 4.75X19.1 (Anchor) ×1 IMPLANT
APL PRP STRL LF DISP 70% ISPRP (MISCELLANEOUS) ×1
BAG COUNTER SPONGE SURGICOUNT (BAG) ×2 IMPLANT
BAG SPNG CNTER NS LX DISP (BAG) ×1
BLADE CUTTER GATOR 3.5 (BLADE) IMPLANT
BLADE EXCALIBUR 4.0X13 (MISCELLANEOUS) IMPLANT
BLADE SURG 11 STRL SS (BLADE) IMPLANT
BUR OVAL 6.0 (BURR) IMPLANT
CANNULA TWIST IN 8.25X7CM (CANNULA) ×1 IMPLANT
CHLORAPREP W/TINT 26 (MISCELLANEOUS) ×2 IMPLANT
COOLER ICEMAN CLASSIC (MISCELLANEOUS) ×1 IMPLANT
COVER SURGICAL LIGHT HANDLE (MISCELLANEOUS) ×2 IMPLANT
DRAPE INCISE IOBAN 66X45 STRL (DRAPES) ×4 IMPLANT
DRAPE STERI 35X30 U-POUCH (DRAPES) ×2 IMPLANT
DRAPE U-SHAPE 47X51 STRL (DRAPES) ×4 IMPLANT
DRSG TEGADERM 4X4.75 (GAUZE/BANDAGES/DRESSINGS) ×6 IMPLANT
ELECT REM PT RETURN 9FT ADLT (ELECTROSURGICAL) ×2
ELECTRODE REM PT RTRN 9FT ADLT (ELECTROSURGICAL) ×1 IMPLANT
FILTER STRAW FLUID ASPIR (MISCELLANEOUS) ×2 IMPLANT
GAUZE SPONGE 4X4 12PLY STRL (GAUZE/BANDAGES/DRESSINGS) ×3 IMPLANT
GAUZE XEROFORM 1X8 LF (GAUZE/BANDAGES/DRESSINGS) ×2 IMPLANT
GLOVE SRG 8 PF TXTR STRL LF DI (GLOVE) ×1 IMPLANT
GLOVE SURG ENC MOIS LTX SZ6 (GLOVE) ×2 IMPLANT
GLOVE SURG LTX SZ7 (GLOVE) ×2 IMPLANT
GLOVE SURG LTX SZ8 (GLOVE) ×2 IMPLANT
GLOVE SURG UNDER POLY LF SZ6.5 (GLOVE) ×2 IMPLANT
GLOVE SURG UNDER POLY LF SZ7 (GLOVE) ×2 IMPLANT
GLOVE SURG UNDER POLY LF SZ8 (GLOVE) ×2
GOWN STRL REUS W/ TWL LRG LVL3 (GOWN DISPOSABLE) ×3 IMPLANT
GOWN STRL REUS W/TWL LRG LVL3 (GOWN DISPOSABLE) ×6
GRAFT TISS 40X70 3 THK DERM (Tissue) IMPLANT
HYDROGEN PEROXIDE 16OZ (MISCELLANEOUS) ×2 IMPLANT
KIT BASIN OR (CUSTOM PROCEDURE TRAY) ×2 IMPLANT
KIT BIO-SUTURETAK 2.4 SPR TROC (KITS) ×2 IMPLANT
KIT TURNOVER KIT B (KITS) ×2 IMPLANT
MANIFOLD NEPTUNE II (INSTRUMENTS) ×2 IMPLANT
NDL HYPO 25X1 1.5 SAFETY (NEEDLE) ×1 IMPLANT
NDL SCORPION MULTI FIRE (NEEDLE) IMPLANT
NDL SPNL 18GX3.5 QUINCKE PK (NEEDLE) ×1 IMPLANT
NDL SUT 6 .5 CRC .975X.05 MAYO (NEEDLE) ×1 IMPLANT
NEEDLE HYPO 25X1 1.5 SAFETY (NEEDLE) ×2 IMPLANT
NEEDLE MAYO TAPER (NEEDLE) ×2
NEEDLE SCORPION MULTI FIRE (NEEDLE) IMPLANT
NEEDLE SPNL 18GX3.5 QUINCKE PK (NEEDLE) ×2 IMPLANT
NS IRRIG 1000ML POUR BTL (IV SOLUTION) ×2 IMPLANT
PACK SHOULDER (CUSTOM PROCEDURE TRAY) ×2 IMPLANT
PAD ABD 7.5X8 STRL (GAUZE/BANDAGES/DRESSINGS) ×1 IMPLANT
PAD ARMBOARD 7.5X6 YLW CONV (MISCELLANEOUS) ×4 IMPLANT
PAD ORTHO SHOULDER 7X19 LRG (SOFTGOODS) ×1 IMPLANT
PORT APPOLLO RF 90DEGREE MULTI (SURGICAL WAND) IMPLANT
RESTRAINT HEAD UNIVERSAL NS (MISCELLANEOUS) ×2 IMPLANT
SLING ARM IMMOBILIZER LRG (SOFTGOODS) IMPLANT
SOL PREP POV-IOD 4OZ 10% (MISCELLANEOUS) ×4 IMPLANT
SPONGE T-LAP 4X18 ~~LOC~~+RFID (SPONGE) ×2 IMPLANT
STRIP CLOSURE SKIN 1/2X4 (GAUZE/BANDAGES/DRESSINGS) ×2 IMPLANT
SUCTION FRAZIER HANDLE 10FR (MISCELLANEOUS) ×2
SUCTION TUBE FRAZIER 10FR DISP (MISCELLANEOUS) ×1 IMPLANT
SUT 2 FIBERLOOP 20 STRT BLUE (SUTURE)
SUT ETHILON 3 0 PS 1 (SUTURE) ×6 IMPLANT
SUT FIBERWIRE #2 38 T-5 BLUE (SUTURE)
SUT MNCRL AB 3-0 PS2 18 (SUTURE) ×2 IMPLANT
SUT VIC AB 0 CT1 27 (SUTURE) ×4
SUT VIC AB 0 CT1 27XBRD ANBCTR (SUTURE) ×2 IMPLANT
SUT VIC AB 1 CT1 27 (SUTURE) ×2
SUT VIC AB 1 CT1 27XBRD ANBCTR (SUTURE) ×1 IMPLANT
SUT VIC AB 2-0 CT1 27 (SUTURE) ×4
SUT VIC AB 2-0 CT1 TAPERPNT 27 (SUTURE) ×1 IMPLANT
SUT VICRYL 0 UR6 27IN ABS (SUTURE) ×2 IMPLANT
SUTURE 2 FIBERLOOP 20 STRT BLU (SUTURE) IMPLANT
SUTURE FIBERWR #2 38 T-5 BLUE (SUTURE) IMPLANT
SUTURE TAPE TIGERLINK 1.3MM BL (SUTURE) IMPLANT
SUTURETAPE TIGERLINK 1.3MM BL (SUTURE) ×2
SYR 20ML LL LF (SYRINGE) ×4 IMPLANT
SYR 30ML LL (SYRINGE) ×2 IMPLANT
SYR TB 1ML LUER SLIP (SYRINGE) ×2 IMPLANT
TAPE CLOTH SURG 6X10 WHT LF (GAUZE/BANDAGES/DRESSINGS) ×1 IMPLANT
TISSUE ARTHOFLEX THICK 3MM (Tissue) ×2 IMPLANT
TOWEL GREEN STERILE (TOWEL DISPOSABLE) ×2 IMPLANT
TOWEL GREEN STERILE FF (TOWEL DISPOSABLE) ×2 IMPLANT
TUBING ARTHROSCOPY IRRIG 16FT (MISCELLANEOUS) ×2 IMPLANT
WAND STAR VAC 90 (SURGICAL WAND) ×1 IMPLANT
WATER STERILE IRR 1000ML POUR (IV SOLUTION) ×2 IMPLANT

## 2021-08-20 NOTE — Discharge Instructions (Signed)
     Discharge Instructions    Attending Surgeon: Huel Cote, MD Office Phone Number: [phone]   Diagnosis and Procedures:    Surgeries Performed: Left shoulder arthroscopy with superior capsular reconstruction  Discharge Plan:    Diet: Resume usual diet. Begin with light or bland foods.  Drink plenty of fluids.  Activity:  Keep sling and dressing in place until your follow up visit in Physical Therapy You are advised to go home directly from the hospital or surgical center. Restrict your activities.  GENERAL INSTRUCTIONS: 1.  Keep your surgical site elevated above your heart for at least 5-7 days or longer to prevent swelling. This will improve your comfort and your overall recovery following surgery.     2. Please call Dr. Serena Croissant office at [phone] with questions Monday-Friday during business hours. If no one answers, please leave a message and someone should get back to the patient within 24 hours. For emergencies please call 911 or proceed to the emergency room.   3. Patient to notify surgical team if experiences any of the following: Bowel/Bladder dysfunction, uncontrolled pain, nerve/muscle weakness, incision with increased drainage or redness, nausea/vomiting and Fever greater than 101.0 F.  Be alert for signs of infection including redness, streaking, odor, fever or chills. Be alert for excessive pain or bleeding and notify your surgeon immediately.  WOUND INSTRUCTIONS:   Leave your dressing/cast/splint in place until your post operative visit.  Keep it clean and dry.  Always keep the incision clean and dry until the staples/sutures are removed. If there is no drainage from the incision you should keep it open to air. If there is drainage from the incision you must keep it covered at all times until the drainage stops  Do not soak in a bath tub, hot tub, pool, lake or other body of water until 21 days after your surgery and your incision is completely dry  and healed.  If you have removable sutures (or staples) they must be removed 10-14 days (unless otherwise instructed) from the day of your surgery.     1)  Elevate the extremity as much as possible.  2)  Keep the dressing clean and dry.  3)  Please call us if the dressing becomes wet or dirty.  4)  If you are experiencing worsening pain or worsening swelling, please call.     MEDICATIONS: Resume all previous home medications at the previous prescribed dose and frequency unless otherwise noted Start taking the  pain medications on an as-needed basis as prescribed  Please taper down pain medication over the next week following surgery.  Ideally you should not require a refill of any narcotic pain medication.  Take pain medication with food to minimize nausea. In addition to the prescribed pain medication, you may take over-the-counter pain relievers such as Tylenol.  Do NOT take additional tylenol if your pain medication already has tylenol in it.  Aspirin 325mg  daily for four weeks.      FOLLOWUP INSTRUCTIONS: 1. Follow up at the Physical Therapy Clinic 3-4 days following surgery. This appointment should be scheduled unless other arrangements have been made.The Physical Therapy scheduling number is [phone] if an appointment has not already been arranged.  2. Contact Dr. [phone] office during office hours at [phone] or the practice after hours line at [phone] for non-emergencies. For medical emergencies call 911.   Discharge Location: Home

## 2021-08-20 NOTE — Telephone Encounter (Signed)
Spoke to wife directly and explained he did not need to keep compression socks on. Pt also asked for clarification on icing the shoulder and additional questions were answered.

## 2021-08-20 NOTE — Op Note (Signed)
Date of Surgery: 08/20/2021  INDICATIONS: Mr. Brian Gross is a 55 y.o.-year-old male with left shoulder pain and weakness consistent with a full-thickness retracted rotator cuff tear with fatty atrophy.  While there is near full atrophy of the supraspinatus he does have good infraspinatus bulk.  Given his young age and activity status I ultimately recommended superior capsular reconstruction in the hopes of deferring a reverse shoulder arthroplasty.  The risk and benefits of the procedure with discussed in detail and documented in the pre-operative evaluation.  PREOPERATIVE DIAGNOSIS: 1.  Left full-thickness retracted massive rotator cuff tear 2.  Left subacromial bursitis  POSTOPERATIVE DIAGNOSIS: Same.  PROCEDURE: 1.  Left superior capsular reconstruction 2.  Left subacromial decompression with acromioplasty 3.  Limited shoulder debridement  SURGEON: Brian Deeds MD  ASSISTANT: Olga Millers, ATC; necessary for the timely completion of procedure and due to complexity of procedure.  ANESTHESIA:  general  IV FLUIDS AND URINE: See anesthesia record.  ANTIBIOTICS: Ancef 2 g  ESTIMATED BLOOD LOSS: 15 mL.  IMPLANTS:  Implant Name Type Inv. Item Serial No. Manufacturer Lot No. LRB No. Used Action  TISSUE ARTHOFLEX THICK - [id number] Tissue TISSUE ARTHOFLEX THICK [id number] [facility]  Left 1 Implanted  [facility] SUTURETAK 2.4X12 [facility] # - [id number] Anchor ANCHOR SUTURETAK 2.4X12 BIOC #  [employer] [id number] Left 1 Implanted  [facility] SUTURETAK 2.4X12 [facility] # - [id number] Anchor ANCHOR SUTURETAK 2.4X12 BIOC #  [employer] [id number] Left 1 Implanted  [facility] SUTURETAK 2.4X12 [facility] # - [id number] Anchor ANCHOR SUTURETAK 2.4X12 BIOC #  [employer] [id number] Left 1 Implanted  ANCHOR SUT FBRTK 2.6X1.7X2 - [id number] Anchor ANCHOR SUT FBRTK 2.6X1.7X2  [employer] [id number] Left 1 Implanted  ANCHOR SUT FBRTK 2.6X1.7X2 - [id number] Anchor ANCHOR SUT FBRTK 2.6X1.7X2  [employer] [id number] Left 1  Implanted  ANCHOR SUT BIO SW 4.75X19.1 - [id number] Anchor ANCHOR SUT BIO SW 4.75X19.1  [employer] [id number] Left 1 Implanted  [facility] BIO 4.75X19.1 - [id number] Anchor ANCHOR SWIVELOCK BIO 4.75X19.1  [employer] [id number] Left 1 Implanted  ANCHOR SUT BIO SW 4.75X19.1 - [id number] Anchor ANCHOR SUT BIO SW 4.75X19.1  [employer] [id number] Left 1 Implanted    DRAINS: None  CULTURES: None  COMPLICATIONS: none  DESCRIPTION OF PROCEDURE:  Examination under anesthesia revealed forward elevation of 135 degrees.   With the arm at the side, there was 50 degrees of external rotation.    Arthroscopic findings demonstrated:  Glenoid cartilage: Normal Humeral head: Normal Labrum: Anterior superior and posterior fraying Biceps insertion: Intact Biceps tendon: Well appearing with no fraying   or redness Subscapularis insertion: intact Rotator cuff: Full-thickness massive rotator cuff tear with retraction to the level of the glenoid Subacromial space: Significant subacromial bursitis with acromial spurring  The patient was identified in the preoperative holding area the correct site was marked according to universal protocol.  A long-acting block was performed by anesthesia.  He is subsequently taken back to the operating room.  Anesthesia was induced.  He was positioned in the beachchair position patient he was prepped and draped in the usual sterile fashion.  We began initially with diagnostic arthroscopy.  A standard posterior portal was used with 11 blade.  The scope was introduced in the shoulder and diagnostic arthroscopy ensued with the above findings.  An anterior portal was established with spinal needle and 11 blade.  A shaver was introduced in the shoulder and gentle shaving of the anterior posterior and superior border of the labrum  were performed.  Electrocautery was introduced and a portion of the superior glenoid was then prepared for the glenoid anchors for the superior capsular  reconstruction.  These were then placed sequentially.  The anterior portal was then used to create the anteriormost anchor.  A supplemental posterior lateral portal was used to create our middle and posterior anchors.  These were initially drilled and then placed.  One of the sliding sutures was removed from each of these to have 6 medial day sutures.   We are happy with suture placement.  As result attention was then turned to the open portion of the case.  A mini open approach was used to the lateral shoulder.  An incision was made 5 cm from the anterior border of the acromion to the midportion of the acromion in line with the lateral ridge of the acromion.  15 blade was used to sharply dissected through skin.  Electrocautery was used to obtain hemostasis.  The deltoid fascia was exposed.  An Army-Navy retractor was used to identify the fatty raphae and this dissection plane was opened using electrocautery and Metzenbaum scissors.  The subacromial space was identified and a Covell retractor was used to show the subacromial space.  A subacromial bursectomy then ensued removing all of this tissue with a Metzenbaum scissors.  A shaver was then used on the undersurface of the acromion to perform acromioplasty from anterior to posterior lateral to medial.  A shaver was also used to perform tuboplasty of the sclerotic and large greater tuberosity.  On a separate Mayo table, we then prepared our arthroflex graft.  This was initially contoured.  We then used a free needle to place the medialmost border of the glenoid sutures through the medial portion of the graft.  These were equally spaced.  These were parachuted down to the glenoid using a Kingfisher to help place the tissue.  Following this we sequentially tied our medial glenoid sutures with an arthroscopic knot pusher.  The graft was then tensioned provisionally and then contoured in order to achieve the best depression of the humeral head.  The humerus was  depressed during lateral row preparation so as to achieve the optimal tensioning.  We first placed our medial row with 2 knotless anchors anterior and posterior.  These were passed through the portion of the graft again confirming tensioning.  These were subsequently brought into lateral row anchors anterior and posterior swivel locks.  We then used the knotless mechanism on the medial row of the lateral graft to achieve additional footprint compression.  Free FiberWire sutures #2 was used anterior and posterior to hook up the graft to existing rotator cuff.  In particular there was some posterior rotator cuff of the infraspinatus to attaches capsular reconstruction 2.  This was done with 2 figure-of-eight style sutures.  The wounds were thoroughly irrigated and closed in layers with 2-0 Vicryl 0 Vicryl and 3-0 nylon.  Gauze ABD and Medipore tape were then placed.  The patient was placed in a sling.  All counts were correct at the end of the case    Olga Millers ATC was necessary for opening, closing, retracting, limb positioning and overall facilitation and timely completion of the procedure.     POSTOPERATIVE PLAN: He will be strict nonweightbearing on the left arm aside from coming out of this for hygiene.  He will be seen by physical therapy the first week and an initial dressing change will be performed.  He will begin passive range of  motion per protocol with physical therapy.  He will see me in 2 weeks for suture removal.  Brian Deeds, MD 10:29 AM

## 2021-08-20 NOTE — Interval H&P Note (Signed)
History and Physical Interval Note:  08/20/2021 7:12 AM  Brian E Corella Sr.  has presented today for surgery, with the diagnosis of complete rotator cuff tear left shoulder.  The various methods of treatment have been discussed with the patient and family. After consideration of risks, benefits and other options for treatment, the patient has consented to  Procedure(s): LEFT SHOULDER ARTHROSCOPY WITH SUPERIOR CAPSULAR RECONSTRUCTION (SCR) (Left) as a surgical intervention.  The patient's history has been reviewed, patient examined, no change in status, stable for surgery.  I have reviewed the patient's chart and labs.  Questions were answered to the patient's satisfaction.     Huel Cote

## 2021-08-20 NOTE — Anesthesia Postprocedure Evaluation (Signed)
Anesthesia Post Note  Patient: Brian E Katherina Right Sr.  Procedure(s) Performed: LEFT SHOULDER ARTHROSCOPY WITH SUPERIOR CAPSULAR RECONSTRUCTION (SCR) (Left)     Patient location during evaluation: PACU Anesthesia Type: Regional and General Level of consciousness: awake Pain management: pain level controlled Vital Signs Assessment: post-procedure vital signs reviewed and stable Respiratory status: spontaneous breathing, nonlabored ventilation, respiratory function stable and patient connected to nasal cannula oxygen Cardiovascular status: blood pressure returned to baseline and stable Postop Assessment: no apparent nausea or vomiting Anesthetic complications: no   No notable events documented.  Last Vitals:  Vitals:   08/20/21 1140 08/20/21 1155  BP: (!) 113/58 118/74  Pulse: 72 74  Resp: 15 10  Temp:  36.8 C  SpO2: 97% 95%    Last Pain:  Vitals:   08/20/21 1155  TempSrc:   PainSc: 0-No pain                 Kayan Blissett P Jannelle Notaro

## 2021-08-20 NOTE — Brief Op Note (Signed)
   Brief Op Note  Date of Surgery: 08/20/2021  Preoperative Diagnosis: complete rotator cuff tear left shoulder  Postoperative Diagnosis: same  Procedure: Procedure(s): LEFT SHOULDER ARTHROSCOPY WITH SUPERIOR CAPSULAR RECONSTRUCTION (SCR)  Implants: Implant Name Type Inv. Item Serial No. Manufacturer Lot No. LRB No. Used Action  TISSUE ARTHOFLEX THICK - [id number] Tissue TISSUE ARTHOFLEX THICK [id number] [facility]  Left 1 Implanted  [facility] SUTURETAK 2.4X12 [facility] # - [id number] Anchor ANCHOR SUTURETAK 2.4X12 BIOC #  [employer] [id number] Left 1 Implanted  [facility] SUTURETAK 2.4X12 [facility] # - [id number] Anchor ANCHOR SUTURETAK 2.4X12 BIOC #  [employer] [id number] Left 1 Implanted  [facility] SUTURETAK 2.4X12 [facility] # - [id number] Anchor ANCHOR SUTURETAK 2.4X12 BIOC #  [employer] [id number] Left 1 Implanted  ANCHOR SUT FBRTK 2.6X1.7X2 - [id number] Anchor ANCHOR SUT FBRTK 2.6X1.7X2  [employer] [id number] Left 1 Implanted  ANCHOR SUT FBRTK 2.6X1.7X2 - [id number] Anchor ANCHOR SUT FBRTK 2.6X1.7X2  [employer] [id number] Left 1 Implanted  ANCHOR SUT BIO SW 4.75X19.1 - [id number] Anchor ANCHOR SUT BIO SW 4.75X19.1  [employer] [id number] Left 1 Implanted  [facility] BIO 4.75X19.1 - [id number] Anchor ANCHOR SWIVELOCK BIO 4.75X19.1  [employer] [id number] Left 1 Implanted  ANCHOR SUT BIO SW 4.75X19.1 - [id number] Anchor ANCHOR SUT BIO SW 4.75X19.1  Marcie Bal [id number] Left 1 Implanted    Surgeons: Surgeon(s): Huel Cote, MD  Anesthesia: Regional    Estimated Blood Loss: See anesthesia record  Complications: None  Condition to PACU: Stable  Benancio Deeds, MD 08/20/2021 10:41 AM

## 2021-08-20 NOTE — Anesthesia Procedure Notes (Signed)
Anesthesia Regional Block: Interscalene brachial plexus block   Pre-Anesthetic Checklist: , timeout performed,  Correct Patient, Correct Site, Correct Laterality,  Correct Procedure, Correct Position, site marked,  Risks and benefits discussed,  Surgical consent,  Pre-op evaluation,  At surgeon's request and post-op pain management  Laterality: Left  Prep: Maximum Sterile Barrier Precautions used, chloraprep       Needles:  Injection technique: Single-shot  Needle Type: Echogenic Stimulator Needle     Needle Length: 5cm  Needle Gauge: 22     Additional Needles:   Procedures:,,,, ultrasound used (permanent image in chart),,    Narrative:  Start time: 08/20/2021 7:20 AM End time: 08/20/2021 7:27 AM Injection made incrementally with aspirations every 5 mL.  Performed by: Personally  Anesthesiologist: Elmer Picker, MD  Additional Notes: Monitors applied. No increased pain on injection. No increased resistance to injection. Injection made in 5cc increments. Good needle visualization. Patient tolerated procedure well.

## 2021-08-20 NOTE — Transfer of Care (Signed)
Immediate Anesthesia Transfer of Care Note  Patient: Brian E Katherina Right Sr.  Procedure(s) Performed: LEFT SHOULDER ARTHROSCOPY WITH SUPERIOR CAPSULAR RECONSTRUCTION (SCR) (Left)  Patient Location: PACU  Anesthesia Type:General  Level of Consciousness: drowsy, patient cooperative and responds to stimulation  Airway & Oxygen Therapy: Patient Spontanous Breathing and Patient connected to face mask oxygen  Post-op Assessment: Report given to RN, Post -op Vital signs reviewed and stable and Patient moving all extremities X 4  Post vital signs: Reviewed and stable  Last Vitals:  Vitals Value Taken Time  BP 139/71 08/20/21 1055  Temp 36.6 C 08/20/21 1055  Pulse 87 08/20/21 1058  Resp 14 08/20/21 1058  SpO2 100 % 08/20/21 1058  Vitals shown include unvalidated device data.  Last Pain:  Vitals:   08/20/21 0554  TempSrc:   PainSc: 0-No pain      Patients Stated Pain Goal: 0 (08/20/21 0554)  Complications: No notable events documented.

## 2021-08-20 NOTE — Anesthesia Procedure Notes (Addendum)
Procedure Name: Intubation Date/Time: 08/20/2021 7:48 AM Performed by: Michele Rockers, CRNA Pre-anesthesia Checklist: Patient identified, Patient being monitored, Timeout performed, Emergency Drugs available and Suction available Patient Re-evaluated:Patient Re-evaluated prior to induction Oxygen Delivery Method: Circle System Utilized Preoxygenation: Pre-oxygenation with 100% oxygen Induction Type: IV induction and Rapid sequence Laryngoscope Size: Mac, Glidescope and 4 Grade View: Grade I Tube type: Oral Tube size: 8.0 mm Number of attempts: 1 Airway Equipment and Method: Stylet and Video-laryngoscopy Placement Confirmation: ETT inserted through vocal cords under direct vision, positive ETCO2 and breath sounds checked- equal and bilateral Secured at: 22 cm Tube secured with: Tape Dental Injury: Teeth and Oropharynx as per pre-operative assessment  Difficulty Due To: Difficult Airway- due to limited oral opening, Difficult Airway- due to large tongue and Difficulty was anticipated

## 2021-08-20 NOTE — Telephone Encounter (Signed)
Pts wife Burna Mortimer called stating compression socks were put on the pt during his surgery and they were left on after. They would like a CB so they can find out how long the pt is supposed to keep them on.   [phone]

## 2021-08-23 NOTE — Therapy (Signed)
OUTPATIENT PHYSICAL THERAPY SHOULDER EVALUATION   Patient Name: Brian WICHMAN Sr. MRN: [id number] DOB:29-Jul-1966, 55 y.o., male Today's Date: 08/24/2021   PT End of Session - 08/24/21 0932     Visit Number 1    Number of Visits 32    Date for PT Re-Evaluation 11/22/21    Progress Note Due on Visit --   09/23/2021   PT Start Time 0805    PT Stop Time 0915    PT Time Calculation (min) 70 min    Activity Tolerance Patient tolerated treatment well   After taking the bandage off, Pt felt that he may get sick.  Pt sat on the edge of table while wife and PT fanned him with a pillow.  He felt better after a little while of sitting with being fanned.   Behavior During Therapy [facility] for tasks assessed/performed             Past Medical History:  Diagnosis Date   Asthma    Borderline diabetes    CAD (coronary artery disease)    a. s/p anterior STEMI in 09/2011 with aspiration of occluded LAD with DES placed at that time, CTO of RCA noted. 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 EF at 30% by cath in 09/2011. Has refused echos in the past.    MI (myocardial infarction) (HCC)    Obesity    SOB (shortness of breath)    Past Surgical History:  Procedure Laterality Date   CHOLECYSTECTOMY     LEFT HEART CATHETERIZATION WITH CORONARY ANGIOGRAM N/A 10/18/2011   Procedure: LEFT HEART CATHETERIZATION WITH CORONARY ANGIOGRAM;  Surgeon: Corky Crafts, MD;  Location: [facility] CATH LAB;  Service: Cardiovascular;  Laterality: N/A;   LUNG BIOPSY  1990s   PERCUTANEOUS CORONARY STENT INTERVENTION (PCI-S) N/A 10/18/2011   Procedure: PERCUTANEOUS CORONARY STENT INTERVENTION (PCI-S);  Surgeon: Corky Crafts, MD;  Location: [facility] CATH LAB;  Service: Cardiovascular;  Laterality: N/A;   Patient Active Problem List   Diagnosis Date Noted   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    Subacromial bursitis of left shoulder joint    Left shoulder pain 07/10/2021   PCP NOTES >>>>>>>>>>>>>>>> 05/03/2017   Hemorrhoids 02/14/2012   CAD (coronary artery disease) 12/01/2011   Hyperlipidemia 12/01/2011   Ischemic cardiomyopathy 12/01/2011   Asthma 10/25/2011   Borderline diabetes 10/25/2011   Acute MI, anterior wall (HCC) 10/21/2011   Coronary atherosclerosis of native coronary artery 10/21/2011   Obesity, unspecified 10/21/2011   GOUT 02/24/2010    PCP: Wanda Plump, MD  REFERRING PROVIDER: Huel Cote, MD  REFERRING DIAG: [phone] (ICD-10-CM) - Left shoulder pain, unspecified chronicity; M75.102 (ICD-10-CM) - Unspecified rotator cuff tear or rupture of left shoulder  THERAPY DIAG:  Left shoulder pain, unspecified chronicity  Stiffness of left shoulder, not elsewhere classified  Muscle weakness (generalized)   ONSET DATE: 08/20/2021:  Left shoulder arthroscopy with superior capsular reconstruction, subacromial decompression with acromioplasty, and limited shoulder debridement.  SUBJECTIVE:  SUBJECTIVE STATEMENT: About 2 years ago, he jumped from a deer stand due to hornets.  He landed on his feet but then fell onto L shoulder.  He had issues with his shoulder since.  About 2 months ago, he was reaching overhead for a lure while fishing and felt a pop and had immediate pain.  He was then unable to move arm well.  Pt had a MRI which showed a massive rotator cuff tear and MD did not believe the rotator cuff to be repairable.  Pt underwent Left shoulder arthroscopy with superior capsular reconstruction, subacromial decompression with acromioplasty, and limited shoulder debridement on 08/20/2021.  Op note indicated pt to be strict nonweightbearing on the left arm  aside from coming out of this for hygiene. He will be seen by physical therapy the first week and an initial dressing change will be performed.  Pt to begin passive range of motion per protocol with physical therapy.  PT messaged MD about protocol and MD messaged PT to use large RCR repair protocol.    Pt is sling dependent and is limited with all self care activities and ADLs/IADLs.  Pt unable to perform reaching and overhead activities.  Pt unable to fish and hunt.  He is unable to perform his occupational activities.    PERTINENT HISTORY: L shoulder superior capsular reconstruction (08/20/2021).  MI, CAD, Cadiac surgery with stent placement in 2013, obesity  PAIN:  Are you having pain? Not at rest with meds. NPRS scale: 0/10 current with pain meds, 7/10 worst Pain location: shoulder Pain orientation: Left    PRECAUTIONS: Other: Per Dr. Serena Croissant large RCR protocol.  Also superior capsular reconstruction protocol.  WEIGHT BEARING RESTRICTIONS Yes NWB L UE  FALLS:  Has patient fallen in last 6 months? No   OCCUPATION:  Has a manual labor job.  Floor covering, repair work, drives equipment for paving.    PLOF: Independent.  Pt able to perform work activities though did have some difficulty with lifting objects overhead.  Pt was performing bass fishing tournaments.   PATIENT GOALS to return to PLOF.  to be able to fish and hunt.  OBJECTIVE:   DIAGNOSTIC FINDINGS: (Per MD notes) -Pt had an Xray which was normal. -MRI left shoulder showed a complete rotator cuff tear with significant retraction and muscle degeneration involving the supra and infraspinatus.  PATIENT SURVEYS:  FOTO 41  COGNITION:  Overall cognitive status: Within functional limits for tasks assessed     OBSERATION:  Pt is in sling with abduction pillow.  Pt has large bandage over L shoulder.  PT removed bandage and placed gauze, tegaderm, and band-aid appropriately over incision and portals.  Pt has incision on  superior L shoulder with stitches and ant and post portals with stitches.  Incision was raised.  Incision and portals were intact with stitches.  No excessive bleeding or drainage noted.    Pt is R hand dominant.  UPPER EXTREMITY AROM/PROM:  A/PROM Right 08/24/2021 Left 08/24/2021  Shoulder flexion 160 PROM: 48 deg  Shoulder scaption 168   Shoulder abduction 155   Shoulder adduction    Shoulder extension    Shoulder internal rotation    Shoulder external rotation  PROM: 0 deg with arm at side  Elbow flexion    Elbow extension    Wrist flexion    Wrist extension    Wrist ulnar deviation    Wrist radial deviation    Wrist pronation    Wrist supination    (  Blank rows = not tested)  UPPER EXTREMITY MMT:  MMT Right 08/24/2021 Left 08/24/2021  Shoulder flexion    Shoulder extension    Shoulder abduction    Shoulder adduction    Shoulder extension    Middle trapezius    Lower trapezius    Elbow flexion    Elbow extension    Wrist flexion    Wrist extension    Wrist ulnar deviation    Wrist radial deviation    Wrist pronation    Wrist supination    Grip strength    (Blank rows = not tested)     TODAY'S TREATMENT:  Pt performed pendulums cw, ccw, and f/b approx 15 reps each, wrist flex/ext AROM 2x10 reps in sling, and towel gripping in sling 2x10 reps.  Pt received a HEP Handout and was educated in correct form and appropriate frequency.  Pt instructed he should not have pain with exercises.  Pt education provided; see below.    PATIENT EDUCATION: Education details: Educated pt and wife concerning post op expectations and protocol restrictions, dx, POC, and sling wear/positioning.  Instructed pt and wife to be compliant with sling wear and not to use L UE.  Pt received a HEP handout and was educated in correct form and appropriate frequency.  Pt instructed he should not have pain with exercises.  Pt instructed to perform wrist/hand exercises currently in sling and arm  should be supported.   Person educated: Patient and Spouse Education method: Explanation, Demonstration, Tactile cues, Verbal cues, and Handouts Education comprehension: verbalized understanding, returned demonstration, and verbal cues required   HOME EXERCISE PROGRAM: Access Code: [email or web address] URL: [email or web address] Date: 08/24/2021 Prepared by: Aaron Edelman  Exercises Circular Shoulder Pendulum with Table Support - 3 x daily - 7 x weekly - 2 sets - 10 reps Flexion-Extension Shoulder Pendulum with Table Support - 3 x daily - 7 x weekly - 2 sets - 10 reps Wrist AROM Flexion Extension - 3 x daily - 7 x weekly - 2-3 sets - 10 reps Seated Gripping Towel - 2-3 x daily - 7 x weekly - 2-3 sets - 10 reps (or red ball)  Pt instructed to gently squeeze   ASSESSMENT:  CLINICAL IMPRESSION: Patient is a 55 y.o. male 4 days s/p Left shoulder arthroscopy with superior capsular reconstruction, subacromial decompression with acromioplasty, and limited shoulder debridement presenting to the clinic with expected post op findings of L shoulder pain, limited ROM in L shoulder, and muscle weakness in L UE.  He is sling dependent and limited with all self care activities and ADLs/IADLs.  Pt is unable to perform any reaching or overhead activities per protocol and healing.  He is unable to perform his occupational activities and hobbies.  Objective impairments include decreased activity tolerance, decreased ROM, decreased strength, hypomobility, impaired flexibility, impaired UE functional use, and pain. These impairments are limiting patient from cleaning, community activity, driving, meal prep, occupation, laundry, yard work, and self care activities and reaching activities . Personal factors including 1 comorbidity: CAD with stent placement  are also affecting patient's functional outcome. Patient should benefit from skilled PT to address above impairments and to assist with returning pt to  PLOF.  REHAB POTENTIAL: Good  CLINICAL DECISION MAKING: Stable/uncomplicated  EVALUATION COMPLEXITY: Low   GOALS:   SHORT TERM GOALS:  STG Name Target Date Goal status  1 Pt will be independent and compliant with HEP for improved pain, ROM, strength, and function.  Baseline:  09/14/2021 INITIAL  2 Pt will demo improved L shoulder PROM in flexion to 90 deg and ER to 25 deg for improved ROM and stiffness.   Baseline:  09/14/2021 INITIAL  3 Pt will demo improved L shoulder PROM in flexion to 120 deg and ER to 40 deg for improved ROM, mobility, and stiffness. Baseline: 09/28/2021 INITIAL  4 Pt will wean out of sling without adverse effects Baseline: 10/05/2021 INITIAL  5 Pt will tolerate the initiation of AAROM without adverse effects for improved mobility and progression of protocol to improve fxn.. Baseline: 10/05/2021 INITIAL  6 Pt will demo at least 140 deg of supine flexion AAROM and 45 deg ER for improved mobility and stiffness Baseline: 10/19/2021 INITIAL  7 Pt will demo at least 120 deg of flexion and scaption AAROM in standing for improved reaching Baseline: 10/26/2021 INITIAL  8 Pt will be able to actively elevate L UE > 90 deg without significant shoulder hike or significant pain 11/09/2021 INITIAL  9 Pt will be able to perform his self care activities with no > than minimal difficulty 11/02/2021 INITIAL   LONG TERM GOALS:   LTG Name Target Date Goal status  1 Pt will be able to perform his ADLs and IADLs without significant pain or difficulty.  12/14/2021 Baseline: 12/14/2021 INITIAL  2 Pt will be able to actively reach into an overhead cabinet without significant difficulty or shoulder hike.  Baseline: 11/23/2021 INITIAL  3 Pt will be able to perform his normal reaching and overhead activities without significant pain or difficulty.  Baseline: 12/14/2021 INITIAL  4 Pt will demo 4/5 to 4+/5 MMT strength t/o L shoulder to assist with returning to functional carrying and lifting  and to assist with returning to work activities with expected limitations.  Baseline: 12/14/2020 INITIAL                  PLAN: PT FREQUENCY:  1-2x/wk x 6 weeks and 2x/wk afterwards  PT DURATION: other: 16 weeks  PLANNED INTERVENTIONS: Therapeutic exercises, Therapeutic activity, Neuro Muscular re-education, Patient/Family education, Joint mobilization, Electrical stimulation, Cryotherapy, Moist heat, scar mobilization, Taping, and Manual therapy  PLAN FOR NEXT SESSION: MD messaged PT to use his large RCR repair protocol.  Also consider superior capsular reconstruction protocol.  Cont with PROM per protocol.  Review HEP.  Ice to reduce pain and soreness.   Audie Clear III PT, DPT 08/24/21 5:10 PM

## 2021-08-24 ENCOUNTER — Other Ambulatory Visit: Payer: Self-pay

## 2021-08-24 ENCOUNTER — Encounter (HOSPITAL_BASED_OUTPATIENT_CLINIC_OR_DEPARTMENT_OTHER): Payer: Self-pay | Admitting: Physical Therapy

## 2021-08-24 ENCOUNTER — Ambulatory Visit (HOSPITAL_BASED_OUTPATIENT_CLINIC_OR_DEPARTMENT_OTHER): Payer: 59 | Admitting: Physical Therapy

## 2021-08-24 DIAGNOSIS — M25512 Pain in left shoulder: Secondary | ICD-10-CM

## 2021-08-24 DIAGNOSIS — M75122 Complete rotator cuff tear or rupture of left shoulder, not specified as traumatic: Secondary | ICD-10-CM | POA: Diagnosis not present

## 2021-08-24 DIAGNOSIS — M6281 Muscle weakness (generalized): Secondary | ICD-10-CM

## 2021-08-24 DIAGNOSIS — M25612 Stiffness of left shoulder, not elsewhere classified: Secondary | ICD-10-CM

## 2021-08-25 ENCOUNTER — Encounter (HOSPITAL_BASED_OUTPATIENT_CLINIC_OR_DEPARTMENT_OTHER): Payer: 59 | Admitting: Orthopaedic Surgery

## 2021-08-27 ENCOUNTER — Encounter (HOSPITAL_BASED_OUTPATIENT_CLINIC_OR_DEPARTMENT_OTHER): Payer: Self-pay | Admitting: Physical Therapy

## 2021-08-27 ENCOUNTER — Ambulatory Visit (HOSPITAL_BASED_OUTPATIENT_CLINIC_OR_DEPARTMENT_OTHER): Payer: 59 | Attending: Orthopaedic Surgery | Admitting: Physical Therapy

## 2021-08-27 ENCOUNTER — Other Ambulatory Visit: Payer: Self-pay

## 2021-08-27 DIAGNOSIS — M6281 Muscle weakness (generalized): Secondary | ICD-10-CM | POA: Diagnosis present

## 2021-08-27 DIAGNOSIS — M25512 Pain in left shoulder: Secondary | ICD-10-CM | POA: Diagnosis present

## 2021-08-27 DIAGNOSIS — M25612 Stiffness of left shoulder, not elsewhere classified: Secondary | ICD-10-CM | POA: Diagnosis present

## 2021-08-27 NOTE — Therapy (Signed)
OUTPATIENT PHYSICAL THERAPY TREATMENT NOTE   Patient Name: Brian Gross. MRN: [id number] DOB:Feb 29, 1966, 55 y.o., male Today's Date: 08/27/2021  PCP: Wanda Plump, MD REFERRING PROVIDER: Huel Cote, MD    Past Medical History:  Diagnosis Date   Asthma    Borderline diabetes    CAD (coronary artery disease)    a. s/p anterior STEMI in 09/2011 with aspiration of occluded LAD with DES placed at that time, CTO of RCA noted. EF at 30%   Chest pain    CHF (congestive heart failure) (HCC)    Gout    Granulomatosis    chronic granulomatosis (dx at age 66 @ [facility])   Ischemic cardiomyopathy    a. EF at 30% by cath in 09/2011. Has refused echos in the past.    MI (myocardial infarction) (HCC)    Obesity    SOB (shortness of breath)    Past Surgical History:  Procedure Laterality Date   CHOLECYSTECTOMY     LEFT HEART CATHETERIZATION WITH CORONARY ANGIOGRAM N/A 10/18/2011   Procedure: LEFT HEART CATHETERIZATION WITH CORONARY ANGIOGRAM;  Surgeon: Corky Crafts, MD;  Location: [facility] CATH LAB;  Service: Cardiovascular;  Laterality: N/A;   LUNG BIOPSY  1990s   PERCUTANEOUS CORONARY STENT INTERVENTION (PCI-S) N/A 10/18/2011   Procedure: PERCUTANEOUS CORONARY STENT INTERVENTION (PCI-S);  Surgeon: Corky Crafts, MD;  Location: [facility] CATH LAB;  Service: Cardiovascular;  Laterality: N/A;   Patient Active Problem List   Diagnosis Date Noted   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    Subacromial bursitis of left shoulder joint    Left shoulder pain 07/10/2021   PCP NOTES >>>>>>>>>>>>>>>> 05/03/2017   Hemorrhoids 02/14/2012   CAD (coronary artery disease) 12/01/2011   Hyperlipidemia 12/01/2011   Ischemic cardiomyopathy 12/01/2011   Asthma 10/25/2011   Borderline diabetes 10/25/2011   Acute MI, anterior wall (HCC) 10/21/2011   Coronary atherosclerosis of native coronary artery 10/21/2011   Obesity, unspecified 10/21/2011   GOUT 02/24/2010   PCP: Wanda Plump, MD   REFERRING PROVIDER: Huel Cote, MD   REFERRING DIAG: [phone] (ICD-10-CM) - Left shoulder pain, unspecified chronicity; M75.102 (ICD-10-CM) - Unspecified rotator cuff tear or rupture of left shoulder   THERAPY DIAG:  Left shoulder pain, unspecified chronicity   Stiffness of left shoulder, not elsewhere classified   Muscle weakness (generalized)     ONSET DATE: 08/20/2021:  Left shoulder arthroscopy with superior capsular reconstruction, subacromial decompression with acromioplasty, and limited shoulder debridement.   SUBJECTIVE:  SUBJECTIVE STATEMENT: Patient reports his shoulder has had very little pain. He has not had to use his pain medication in three days.   Pt is sling dependent and is limited with all self care activities and ADLs/IADLs.  Pt unable to perform reaching and overhead activities.  Pt unable to fish and hunt.  He is unable to perform his occupational activities.     PERTINENT HISTORY: L shoulder superior capsular reconstruction (08/20/2021).  MI, CAD, Cadiac surgery with stent placement in 2013, obesity   PAIN:  Are you having pain? Not at rest with meds. NPRS scale: 0/10 current with pain meds, 7/10 worst Pain location: shoulder Pain orientation: Left      PRECAUTIONS: Other: Per Dr. Serena Croissant large RCR protocol.  Also superior capsular reconstruction protocol.   WEIGHT BEARING RESTRICTIONS Yes NWB L UE  TODAY'S TREATMENT:  Pt performed pendulums c.  Pt received a HEP Handout and was educated in correct form and appropriate frequency.  Pt instructed he should not have pain with exercises.  Pt education provided; see below.   11/10: PROM into flexion and er within protocol limits   w, ccw, and f/b approx 15 reps each, wrist flex/ext AROM 2x10 reps in sling, and towel  gripping in sling 2x10 reps   Flexion 75 degrees    ER 25 degrees   PATIENT EDUCATION: Education details: Therapy reviewed protocol limits and use of the sling.  Pt instructed to perform wrist/hand exercises currently in sling and arm should be supported.   Person educated: Patient and Spouse Education method: Explanation, Demonstration, Tactile cues, Verbal cues, and Handouts Education comprehension: verbalized understanding, returned demonstration, and verbal cues required     HOME EXERCISE PROGRAM: Access Code: [email or web address] URL: [email or web address] Date: 08/24/2021 Prepared by: Aaron Edelman   Exercises Circular Shoulder Pendulum with Table Support - 3 x daily - 7 x weekly - 2 sets - 10 reps Flexion-Extension Shoulder Pendulum with Table Support - 3 x daily - 7 x weekly - 2 sets - 10 reps Wrist AROM Flexion Extension - 3 x daily - 7 x weekly - 2-3 sets - 10 reps Seated Gripping Towel - 2-3 x daily - 7 x weekly - 2-3 sets - 10 reps (or red ball)  Pt instructed to gently squeeze     ASSESSMENT:   CLINICAL IMPRESSION: Patient tolerated treatment well. He had very little pain with PROM. He is approaching protocol limits already. The patient demonstrated good technique with his pendulums. Therapy added a 1lb weight to his wrist exercises    REHAB POTENTIAL: Good   CLINICAL DECISION MAKING: Stable/uncomplicated   EVALUATION COMPLEXITY: Low     GOALS:     SHORT TERM GOALS:   STG Name Target Date Goal status  1 Pt will be independent and compliant with HEP for improved pain, ROM, strength, and function.  Baseline:  09/14/2021 INITIAL  2 Pt will demo improved L shoulder PROM in flexion to 90 deg and ER to 25 deg for improved ROM and stiffness.   Baseline:  09/14/2021 INITIAL  3 Pt will demo improved L shoulder PROM in flexion to 120 deg and ER to 40 deg for improved ROM, mobility, and stiffness. Baseline: 09/28/2021 INITIAL  4 Pt will wean out of sling without  adverse effects Baseline: 10/05/2021 INITIAL  5 Pt will tolerate the initiation of AAROM without adverse effects for improved mobility and progression of protocol to improve fxn.. Baseline: 10/05/2021 INITIAL  6 Pt will demo at least 140 deg  of supine flexion AAROM and 45 deg ER for improved mobility and stiffness Baseline: 10/19/2021 INITIAL  7 Pt will demo at least 120 deg of flexion and scaption AAROM in standing for improved reaching Baseline: 10/26/2021 INITIAL  8 Pt will be able to actively elevate L UE > 90 deg without significant shoulder hike or significant pain 11/09/2021 INITIAL  9 Pt will be able to perform his self care activities with no > than minimal difficulty 11/02/2021 INITIAL    LONG TERM GOALS:    LTG Name Target Date Goal status  1 Pt will be able to perform his ADLs and IADLs without significant pain or difficulty.  12/14/2021 Baseline: 12/14/2021 INITIAL  2 Pt will be able to actively reach into an overhead cabinet without significant difficulty or shoulder hike.  Baseline: 11/23/2021 INITIAL  3 Pt will be able to perform his normal reaching and overhead activities without significant pain or difficulty.  Baseline: 12/14/2021 INITIAL  4 Pt will demo 4/5 to 4+/5 MMT strength t/o L shoulder to assist with returning to functional carrying and lifting and to assist with returning to work activities with expected limitations.  Baseline: 12/14/2020 INITIAL                               PLAN: PT FREQUENCY:  1-2x/wk x 6 weeks and 2x/wk afterwards   PT DURATION: other: 16 weeks   PLANNED INTERVENTIONS: Therapeutic exercises, Therapeutic activity, Neuro Muscular re-education, Patient/Family education, Joint mobilization, Electrical stimulation, Cryotherapy, Moist heat, scar mobilization, Taping, and Manual therapy   PLAN FOR NEXT SESSION: MD messaged PT to use his large RCR repair protocol.  Also consider superior capsular reconstruction protocol.  Cont with PROM per protocol.   Review HEP.  Ice to reduce pain and soreness.     Dessie Coma PT DPT  08/27/2021, 8:06 AM

## 2021-08-28 NOTE — Addendum Note (Signed)
Addendum  created 08/28/21 0802 by Elmer Picker, MD   [facility] Staff edited

## 2021-08-31 ENCOUNTER — Ambulatory Visit (HOSPITAL_BASED_OUTPATIENT_CLINIC_OR_DEPARTMENT_OTHER): Payer: 59 | Admitting: Physical Therapy

## 2021-08-31 ENCOUNTER — Other Ambulatory Visit: Payer: Self-pay

## 2021-08-31 ENCOUNTER — Encounter (HOSPITAL_BASED_OUTPATIENT_CLINIC_OR_DEPARTMENT_OTHER): Payer: Self-pay | Admitting: Physical Therapy

## 2021-08-31 DIAGNOSIS — M25512 Pain in left shoulder: Secondary | ICD-10-CM | POA: Diagnosis not present

## 2021-08-31 DIAGNOSIS — M25612 Stiffness of left shoulder, not elsewhere classified: Secondary | ICD-10-CM

## 2021-08-31 DIAGNOSIS — M6281 Muscle weakness (generalized): Secondary | ICD-10-CM

## 2021-08-31 NOTE — Therapy (Signed)
OUTPATIENT PHYSICAL THERAPY TREATMENT NOTE   Patient Name: Brian CRUM Sr. MRN: [id number] DOB:06-26-1966, 55 y.o., male Today's Date: 08/31/2021  PCP: Wanda Plump, MD   PT End of Session - 08/31/21 1045     Visit Number 3    Number of Visits 32    Date for PT Re-Evaluation 11/22/21    Authorization Type [employer]    Progress Note Due on Visit --   09/23/21   PT Start Time 0807    PT Stop Time 0848    PT Time Calculation (min) 41 min    Activity Tolerance Patient tolerated treatment well    Behavior During Therapy [facility] for tasks assessed/performed             Past Medical History:  Diagnosis Date   Asthma    Borderline diabetes    CAD (coronary artery disease)    a. s/p anterior STEMI in 09/2011 with aspiration of occluded LAD with DES placed at that time, CTO of RCA noted. EF at 30%   Chest pain    CHF (congestive heart failure) (HCC)    Gout    Granulomatosis    chronic granulomatosis (dx at age 27 @ [facility])   Ischemic cardiomyopathy    a. EF at 30% by cath in 09/2011. Has refused echos in the past.    MI (myocardial infarction) (HCC)    Obesity    SOB (shortness of breath)    Past Surgical History:  Procedure Laterality Date   CHOLECYSTECTOMY     LEFT HEART CATHETERIZATION WITH CORONARY ANGIOGRAM N/A 10/18/2011   Procedure: LEFT HEART CATHETERIZATION WITH CORONARY ANGIOGRAM;  Surgeon: Corky Crafts, MD;  Location: [facility] CATH LAB;  Service: Cardiovascular;  Laterality: N/A;   LUNG BIOPSY  1990s   PERCUTANEOUS CORONARY STENT INTERVENTION (PCI-S) N/A 10/18/2011   Procedure: PERCUTANEOUS CORONARY STENT INTERVENTION (PCI-S);  Surgeon: Corky Crafts, MD;  Location: [facility] CATH LAB;  Service: Cardiovascular;  Laterality: N/A;   Patient Active Problem List   Diagnosis Date Noted   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    Subacromial bursitis of left shoulder joint    Left shoulder pain 07/10/2021   PCP NOTES >>>>>>>>>>>>>>>> 05/03/2017    Hemorrhoids 02/14/2012   CAD (coronary artery disease) 12/01/2011   Hyperlipidemia 12/01/2011   Ischemic cardiomyopathy 12/01/2011   Asthma 10/25/2011   Borderline diabetes 10/25/2011   Acute MI, anterior wall (HCC) 10/21/2011   Coronary atherosclerosis of native coronary artery 10/21/2011   Obesity, unspecified 10/21/2011   GOUT 02/24/2010   PCP: Wanda Plump, MD   REFERRING PROVIDER: Huel Cote, MD   REFERRING DIAG: [phone] (ICD-10-CM) - Left shoulder pain, unspecified chronicity; M75.102 (ICD-10-CM) - Unspecified rotator cuff tear or rupture of left shoulder   THERAPY DIAG:  Left shoulder pain, unspecified chronicity   Stiffness of left shoulder, not elsewhere classified   Muscle weakness (generalized)     ONSET DATE: 08/20/2021:  Left shoulder arthroscopy with superior capsular reconstruction, subacromial decompression with acromioplasty, and limited shoulder debridement.   SUBJECTIVE:  SUBJECTIVE STATEMENT:   Pt is 1 week and 4 days s/p L shoulder superior capsular reconstruction and subacromial decompression with acroioplasty.  Pt reports compliance with HEP.  Pt denies pain currently and denies any adverse effects after prior Rx.  Pt states he hasn't been taking any pain meds.     Pt is sling dependent and is limited with all self care activities and ADLs/IADLs.  Pt unable to perform reaching and overhead activities.  Pt unable to fish and hunt.  He is unable to perform his occupational activities.     PERTINENT HISTORY: L shoulder superior capsular reconstruction (08/20/2021).  MI, CAD, Cadiac surgery with stent placement in 2013, obesity   PAIN:  Are you having pain? No NPRS scale: 0/10 current with pain meds Pain location: shoulder Pain orientation: Left      PRECAUTIONS: Other:  Per Dr. Serena Croissant large RCR protocol.  Also superior capsular reconstruction protocol.   WEIGHT BEARING RESTRICTIONS Yes NWB L UE   OBJECTIVE:   TODAY'S TREATMENT:  -Reviewed response to prior Rx, HEP compliance, and current function.   -Reviewed and performed HEP.  Pt performed pendulums cw, ccw, and f/b x 20 reps each, wrist flex and ext AROM 2x10 reps seated with arm supported, and towel gripping 2x10 reps seated with arm supported. -Pt received L shoulder flexion and ER PROM within protocol limits   -Assessed L shouler PROM:  Flexion 86 degrees, ER:  30 degrees   -Observation:  Pt had tegaderms and band-aid over incision and portals from his initial Rx.  PT spoke with Dr. Steward Drone about pt's dressings and Dr. Steward Drone instructed PT.  PT removed pt's tegaderm and band aid.  AT assisted PT with applying steri strips over portals and incision.  Incision was intact with stiches and healing well.  He had no signs of infection.     PATIENT EDUCATION: Education details: post op and protocol restrictions including to maintain L UE in sling and not to use L UE with activity.  Objective findings and ROM limits per protocol.  Instructed pt to cont with HEP.     Person educated: Patient  Education method: Explanation, Demonstration, Verbal cues Education comprehension: verbalized understanding, returned demonstration, and verbal cues required     HOME EXERCISE PROGRAM: Access Code: [email or web address] URL: [email or web address] Date: 08/24/2021 Prepared by: Aaron Edelman   Exercises Circular Shoulder Pendulum with Table Support - 3 x daily - 7 x weekly - 2 sets - 10 reps Flexion-Extension Shoulder Pendulum with Table Support - 3 x daily - 7 x weekly - 2 sets - 10 reps Wrist AROM Flexion Extension - 3 x daily - 7 x weekly - 2-3 sets - 10 reps Seated Gripping Towel - 2-3 x daily - 7 x weekly - 2-3 sets - 10 reps (or red ball)  Pt instructed to gently squeeze     ASSESSMENT:   CLINICAL  IMPRESSION: Patient is making great progress with L shoulder PROM and demonstrates improved flexion and ER PROM.  He tolerates PROM well and had no pain with PROM.  Pt is progressing appropriately with ROM per protocol.   Pt required minimal cuing for correct form with pendulums and was able to demo good form with minimal cuing.  PT spoke with Dr. Veneta Penton and he instructed PT to remove addressing and apply steri-strips.  PT and AT applied steri strips over incision and portals.  Pt responded well to Rx having no pain after Rx and states he feels good with PROM.  Pt should benefit from cont skilled PT services per protocol to address ongoing goals and to assist in restoring PLOF.        GOALS:     SHORT TERM GOALS:   STG Name Target Date Goal status  1 Pt will be independent and compliant with HEP for improved pain, ROM, strength, and function.  Baseline:  09/14/2021 INITIAL  2 Pt will demo improved L shoulder PROM in flexion to 90 deg and ER to 25 deg for improved ROM and stiffness.   Baseline:  09/14/2021 INITIAL  3 Pt will demo improved L shoulder PROM in flexion to 120 deg and ER to 40 deg for improved ROM, mobility, and stiffness. Baseline: 09/28/2021 INITIAL  4 Pt will wean out of sling without adverse effects Baseline: 10/05/2021 INITIAL  5 Pt will tolerate the initiation of AAROM without adverse effects for improved mobility and progression of protocol to improve fxn.. Baseline: 10/05/2021 INITIAL  6 Pt will demo at least 140 deg of supine flexion AAROM and 45 deg ER for improved mobility and stiffness Baseline: 10/19/2021 INITIAL  7 Pt will demo at least 120 deg of flexion and scaption AAROM in standing for improved reaching Baseline: 10/26/2021 INITIAL  8 Pt will be able to actively elevate L UE > 90 deg without significant shoulder hike or significant pain 11/09/2021 INITIAL  9 Pt will be able to perform his self care activities with no > than minimal difficulty 11/02/2021 INITIAL     LONG TERM GOALS:    LTG Name Target Date Goal status  1 Pt will be able to perform his ADLs and IADLs without significant pain or difficulty.  12/14/2021 Baseline: 12/14/2021 INITIAL  2 Pt will be able to actively reach into an overhead cabinet without significant difficulty or shoulder hike.  Baseline: 11/23/2021 INITIAL  3 Pt will be able to perform his normal reaching and overhead activities without significant pain or difficulty.  Baseline: 12/14/2021 INITIAL  4 Pt will demo 4/5 to 4+/5 MMT strength t/o L shoulder to assist with returning to functional carrying and lifting and to assist with returning to work activities with expected limitations.  Baseline: 12/14/2020 INITIAL                               PLAN: PT FREQUENCY:  1-2x/wk x 6 weeks and 2x/wk afterwards   PT DURATION: other: 16 weeks   PLANNED INTERVENTIONS: Therapeutic exercises, Therapeutic activity, Neuro Muscular re-education, Patient/Family education, Joint mobilization, Electrical stimulation, Cryotherapy, Moist heat, scar mobilization, Taping, and Manual therapy   PLAN FOR NEXT SESSION: MD messaged PT to use his large RCR repair protocol.  Also consider superior capsular reconstruction protocol.  Cont with PROM per protocol.  Ice to reduce pain and soreness.  Due to his PROM findings and protocol limitations, cancel next appt this week.  Will see pt 1x/wk as long as Pt's PROM is progressing per protoocl.       Audie Clear III PT, DPT 08/31/21 11:01 AM

## 2021-09-03 ENCOUNTER — Encounter (HOSPITAL_BASED_OUTPATIENT_CLINIC_OR_DEPARTMENT_OTHER): Payer: Self-pay | Admitting: Physical Therapy

## 2021-09-03 ENCOUNTER — Ambulatory Visit (INDEPENDENT_AMBULATORY_CARE_PROVIDER_SITE_OTHER): Payer: 59 | Admitting: Orthopaedic Surgery

## 2021-09-03 ENCOUNTER — Other Ambulatory Visit: Payer: Self-pay

## 2021-09-03 DIAGNOSIS — M75102 Unspecified rotator cuff tear or rupture of left shoulder, not specified as traumatic: Secondary | ICD-10-CM

## 2021-09-03 DIAGNOSIS — M12812 Other specific arthropathies, not elsewhere classified, left shoulder: Secondary | ICD-10-CM

## 2021-09-03 NOTE — Progress Notes (Signed)
Post Operative Evaluation    Procedure/Date of Surgery: 08/20/21 left superior capsular reconstruction  Interval History:    Brian Gross presents 2 weeks status post left superior capsular reconstruction overall doing extremely well.  He has been taking his aspirin.  He only needed a few narcotic pain medication pills.  He is now taking ibuprofen as needed.  He is working with physical therapy and has been able to achieve 86 degrees of passive flexion.   PMH/PSH/Family History/Social History/Meds/Allergies:    Past Medical History:  Diagnosis Date   Asthma    Borderline diabetes    CAD (coronary artery disease)    a. s/p anterior STEMI in 09/2011 with aspiration of occluded LAD with DES placed at that time, CTO of RCA noted. EF at 30%   Chest pain    CHF (congestive heart failure) (HCC)    Gout    Granulomatosis    chronic granulomatosis (dx at age 69 @ [facility])   Ischemic cardiomyopathy    a. EF at 30% by cath in 09/2011. Has refused echos in the past.    MI (myocardial infarction) (HCC)    Obesity    SOB (shortness of breath)    Past Surgical History:  Procedure Laterality Date   CHOLECYSTECTOMY     LEFT HEART CATHETERIZATION WITH CORONARY ANGIOGRAM N/A 10/18/2011   Procedure: LEFT HEART CATHETERIZATION WITH CORONARY ANGIOGRAM;  Surgeon: Corky Crafts, MD;  Location: [facility] CATH LAB;  Service: Cardiovascular;  Laterality: N/A;   LUNG BIOPSY  1990s   PERCUTANEOUS CORONARY STENT INTERVENTION (PCI-S) N/A 10/18/2011   Procedure: PERCUTANEOUS CORONARY STENT INTERVENTION (PCI-S);  Surgeon: Corky Crafts, MD;  Location: [facility] CATH LAB;  Service: Cardiovascular;  Laterality: N/A;   Social History   Socioeconomic History   Marital status: Married    Spouse name: Not on file   Number of children: 1   Years of education: Not on file   Highest education level: Not on file  Occupational History   Occupation: Flooring  Tobacco Use    Smoking status: Former    Types: Cigarettes    Quit date: 10/17/1989    Years since quitting: 31.9   Smokeless tobacco: Never  Vaping Use   Vaping Use: Never used  Substance and Sexual Activity   Alcohol use: Not Currently    Alcohol/week: 1.0 standard drink    Types: 1 Cans of beer per week   Drug use: Yes    Frequency: 7.0 times per week    Types: Marijuana    Comment: daily   Sexual activity: Not on file  Other Topics Concern   Not on file  Social History Narrative            Social Determinants of Health   Financial Resource Strain: Not on file  Food Insecurity: Not on file  Transportation Needs: Not on file  Physical Activity: Not on file  Stress: Not on file  Social Connections: Not on file   Family History  Problem Relation Age of Onset   Coronary artery disease Father 55       CABG   Heart attack Father    Healthy Mother    Colon cancer Neg Hx    Prostate cancer Neg Hx    Allergies  Allergen Reactions   Morphine Nausea And  Vomiting   Current Outpatient Medications  Medication Sig Dispense Refill   acetaminophen (TYLENOL) 500 MG tablet Take 500 mg by mouth every 6 (six) hours as needed for moderate pain.     albuterol (VENTOLIN HFA) 108 (90 Base) MCG/ACT inhaler Inhale 1-2 puffs into the lungs every 6 (six) hours as needed for wheezing or shortness of breath. 18 g 2   aspirin EC 325 MG tablet Take 1 tablet (325 mg total) by mouth daily. 30 tablet 0   furosemide (LASIX) 20 MG tablet TAKE 1 TABLET BY MOUTH EVERY DAY 90 tablet 2   losartan (COZAAR) 100 MG tablet Take 1 tablet (100 mg total) by mouth daily. 90 tablet 3   metoprolol succinate (TOPROL-XL) 100 MG 24 hr tablet Take 1 tablet (100 mg total) by mouth daily. Take with or immediately following a meal. 90 tablet 2   nitroGLYCERIN (NITROSTAT) 0.4 MG SL tablet Place 1 tablet (0.4 mg total) under the tongue every 5 (five) minutes as needed for chest pain. 25 tablet 3   oxyCODONE (OXY IR/ROXICODONE) 5 MG  immediate release tablet Take 1 tablet (5 mg total) by mouth every 4 (four) hours as needed (severe pain). 20 tablet 0   rosuvastatin (CRESTOR) 20 MG tablet TAKE 1 TABLET BY MOUTH EVERY DAY 90 tablet 3   No current facility-administered medications for this visit.   No results found.  Review of Systems:   A ROS was performed including pertinent positives and negatives as documented in the HPI.   Musculoskeletal Exam:    There were no vitals taken for this visit.  Portals are clean dry and intact.  Is able to flex and extend at the elbow.  2+ radial pulse.  Fingers warm and well-perfused.  Sensation is intact in all distributions of the left arm  Imaging:    None  I personally reviewed and interpreted the radiographs.   Assessment:   55 year old male 2-week status post left superior capsular reconstruction, overall doing very well he will continue to advance per protocol.  Plan :    -Continue nonweightbearing at this time passive range of motion per protocol -Return to clinic in 4 weeks   I personally saw and evaluated the patient, and participated in the management and treatment plan.  Huel Cote, MD Attending Physician, Orthopedic Surgery  This document was dictated using Dragon voice recognition software. A reasonable attempt at proof reading has been made to minimize errors.

## 2021-09-09 ENCOUNTER — Ambulatory Visit (HOSPITAL_BASED_OUTPATIENT_CLINIC_OR_DEPARTMENT_OTHER): Payer: 59 | Admitting: Physical Therapy

## 2021-09-09 ENCOUNTER — Other Ambulatory Visit: Payer: Self-pay

## 2021-09-09 ENCOUNTER — Encounter (HOSPITAL_BASED_OUTPATIENT_CLINIC_OR_DEPARTMENT_OTHER): Payer: Self-pay | Admitting: Physical Therapy

## 2021-09-09 DIAGNOSIS — M25512 Pain in left shoulder: Secondary | ICD-10-CM | POA: Diagnosis not present

## 2021-09-09 DIAGNOSIS — M25612 Stiffness of left shoulder, not elsewhere classified: Secondary | ICD-10-CM

## 2021-09-09 DIAGNOSIS — M6281 Muscle weakness (generalized): Secondary | ICD-10-CM

## 2021-09-09 NOTE — Therapy (Signed)
OUTPATIENT PHYSICAL THERAPY TREATMENT NOTE   Patient Name: Brian MANGIONE Sr. MRN: [id number] DOB:28-Sep-1966, 55 y.o., male Today's Date: 09/09/2021  PCP: Wanda Plump, MD   PT End of Session - 09/09/21 0900     Visit Number 4    Number of Visits 32    Date for PT Re-Evaluation 11/22/21    Authorization Type [employer]    Progress Note Due on Visit --   09/23/21   PT Start Time 0854    PT Stop Time 0928    PT Time Calculation (min) 34 min    Activity Tolerance Patient tolerated treatment well    Behavior During Therapy [facility] for tasks assessed/performed             Past Medical History:  Diagnosis Date   Asthma    Borderline diabetes    CAD (coronary artery disease)    a. s/p anterior STEMI in 09/2011 with aspiration of occluded LAD with DES placed at that time, CTO of RCA noted. EF at 30%   Chest pain    CHF (congestive heart failure) (HCC)    Gout    Granulomatosis    chronic granulomatosis (dx at age 67 @ [facility])   Ischemic cardiomyopathy    a. EF at 30% by cath in 09/2011. Has refused echos in the past.    MI (myocardial infarction) (HCC)    Obesity    SOB (shortness of breath)    Past Surgical History:  Procedure Laterality Date   CHOLECYSTECTOMY     LEFT HEART CATHETERIZATION WITH CORONARY ANGIOGRAM N/A 10/18/2011   Procedure: LEFT HEART CATHETERIZATION WITH CORONARY ANGIOGRAM;  Surgeon: Corky Crafts, MD;  Location: [facility] CATH LAB;  Service: Cardiovascular;  Laterality: N/A;   LUNG BIOPSY  1990s   PERCUTANEOUS CORONARY STENT INTERVENTION (PCI-S) N/A 10/18/2011   Procedure: PERCUTANEOUS CORONARY STENT INTERVENTION (PCI-S);  Surgeon: Corky Crafts, MD;  Location: [facility] CATH LAB;  Service: Cardiovascular;  Laterality: N/A;   Patient Active Problem List   Diagnosis Date Noted   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    Subacromial bursitis of left shoulder joint    Left shoulder pain 07/10/2021   PCP NOTES >>>>>>>>>>>>>>>> 05/03/2017    Hemorrhoids 02/14/2012   CAD (coronary artery disease) 12/01/2011   Hyperlipidemia 12/01/2011   Ischemic cardiomyopathy 12/01/2011   Asthma 10/25/2011   Borderline diabetes 10/25/2011   Acute MI, anterior wall (HCC) 10/21/2011   Coronary atherosclerosis of native coronary artery 10/21/2011   Obesity, unspecified 10/21/2011   GOUT 02/24/2010   PCP: Wanda Plump, MD   REFERRING PROVIDER: Huel Cote, MD   REFERRING DIAG: [phone] (ICD-10-CM) - Left shoulder pain, unspecified chronicity; M75.102 (ICD-10-CM) - Unspecified rotator cuff tear or rupture of left shoulder   THERAPY DIAG:  Left shoulder pain, unspecified chronicity   Stiffness of left shoulder, not elsewhere classified   Muscle weakness (generalized)     ONSET DATE: 08/20/2021:  Left shoulder arthroscopy with superior capsular reconstruction, subacromial decompression with acromioplasty, and limited shoulder debridement.   SUBJECTIVE:  SUBJECTIVE STATEMENT: -Pt is 2 weeks and 6 days s/p L shoulder superior capsular reconstruction and subacromial decompression with acromioplasty.  Pt is sling dependent and is limited with all self care activities and ADLs/IADLs.  Pt unable to perform reaching and overhead activities.  Pt unable to fish and hunt.  He is unable to perform his occupational activities.   -Pt states he felt good after prior Rx.  Pt reports he hasn't been having any pain.  He has not been using pain meds.  Pt reports compliance with HEP.  Pt saw MD last week and MD note indicated to continue nonweightbearing at this time passive range of motion per protocol   PERTINENT HISTORY: L shoulder superior capsular reconstruction (08/20/2021).  MI, CAD, Cadiac surgery with stent placement in 2013, obesity   PAIN:  Are you having pain?  No NPRS scale: 0/10 current Pain location: shoulder Pain orientation: Left      PRECAUTIONS: Other: Per Dr. Serena Croissant large RCR protocol.  Also superior capsular reconstruction protocol.   WEIGHT BEARING RESTRICTIONS Yes NWB L UE   OBJECTIVE:   TODAY'S TREATMENT:   OBSERVATION: Incision intact and dry with steri strips over it.  Therapeutic Exercise: -Reviewed response to prior Rx, HEP compliance, pain level, and current function.   -Reviewed and performed HEP.  Pt performed pendulums cw, ccw, and f/b x 20 reps each, wrist flex and ext AROM 2x10 reps seated with arm supported, and towel gripping 2x10 reps seated with arm supported. -Pt received L shoulder flexion and ER PROM within protocol limits   -Assessed L shouler PROM:  Flexion 90 degrees, ER:  30 degrees    PATIENT EDUCATION: Education details: post op and protocol restrictions including to maintain L UE in sling and not to use L UE with activity.  Objective findings and ROM limits per protocol.  Instructed pt to cont with HEP.  HEP.     Person educated: Patient  Education method: Explanation, Demonstration, Verbal cues Education comprehension: verbalized understanding, returned demonstration, and verbal cues required     HOME EXERCISE PROGRAM: Access Code: [email or web address] URL: [email or web address] Date: 08/24/2021 Prepared by: Aaron Edelman   Exercises Circular Shoulder Pendulum with Table Support - 3 x daily - 7 x weekly - 2 sets - 10 reps Flexion-Extension Shoulder Pendulum with Table Support - 3 x daily - 7 x weekly - 2 sets - 10 reps Wrist AROM Flexion Extension - 3 x daily - 7 x weekly - 2-3 sets - 10 reps Seated Gripping Towel - 2-3 x daily - 7 x weekly - 2-3 sets - 10 reps (or red ball)  Pt instructed to gently squeeze     ASSESSMENT:   CLINICAL IMPRESSION: Patient is doing well and is making excellent progress with L shoulder PROM.  He tolerates PROM well and has reached PROM limits per  protocol.  Pt states he is not having any pain and has not been using pain meds.   Pt demonstrates good form with pendulums.  Pt responded well to Rx having no pain after Rx.  He states the PROM feels so good.  Pt should benefit from cont skilled PT services per protocol to address ongoing goals and to assist in restoring PLOF.        GOALS:     SHORT TERM GOALS:   STG Name Target Date Goal status  1 Pt will be independent and compliant with HEP for improved pain, ROM, strength, and function.  Baseline:  09/14/2021 INITIAL  2  Pt will demo improved L shoulder PROM in flexion to 90 deg and ER to 25 deg for improved ROM and stiffness.   Baseline:  09/14/2021 INITIAL  3 Pt will demo improved L shoulder PROM in flexion to 120 deg and ER to 40 deg for improved ROM, mobility, and stiffness. Baseline: 09/28/2021 INITIAL  4 Pt will wean out of sling without adverse effects Baseline: 10/05/2021 INITIAL  5 Pt will tolerate the initiation of AAROM without adverse effects for improved mobility and progression of protocol to improve fxn.. Baseline: 10/05/2021 INITIAL  6 Pt will demo at least 140 deg of supine flexion AAROM and 45 deg ER for improved mobility and stiffness Baseline: 10/19/2021 INITIAL  7 Pt will demo at least 120 deg of flexion and scaption AAROM in standing for improved reaching Baseline: 10/26/2021 INITIAL  8 Pt will be able to actively elevate L UE > 90 deg without significant shoulder hike or significant pain 11/09/2021 INITIAL  9 Pt will be able to perform his self care activities with no > than minimal difficulty 11/02/2021 INITIAL    LONG TERM GOALS:    LTG Name Target Date Goal status  1 Pt will be able to perform his ADLs and IADLs without significant pain or difficulty.  12/14/2021 Baseline: 12/14/2021 INITIAL  2 Pt will be able to actively reach into an overhead cabinet without significant difficulty or shoulder hike.  Baseline: 11/23/2021 INITIAL  3 Pt will be able to perform  his normal reaching and overhead activities without significant pain or difficulty.  Baseline: 12/14/2021 INITIAL  4 Pt will demo 4/5 to 4+/5 MMT strength t/o L shoulder to assist with returning to functional carrying and lifting and to assist with returning to work activities with expected limitations.  Baseline: 12/14/2020 INITIAL                               PLAN:    PLANNED INTERVENTIONS: Therapeutic exercises, Therapeutic activity, Neuro Muscular re-education, Patient/Family education, Joint mobilization, Electrical stimulation, Cryotherapy, Moist heat, scar mobilization, Taping, and Manual therapy   PLAN FOR NEXT SESSION: MD messaged PT to use his large RCR repair protocol.  Also consider superior capsular reconstruction protocol.  Cont with PROM per protocol and cont 1x/wk.  Ice to reduce pain and soreness.      Audie Clear III PT, DPT 09/09/21 9:53 AM

## 2021-09-15 ENCOUNTER — Encounter (HOSPITAL_BASED_OUTPATIENT_CLINIC_OR_DEPARTMENT_OTHER): Payer: Self-pay | Admitting: Physical Therapy

## 2021-09-15 ENCOUNTER — Other Ambulatory Visit: Payer: Self-pay

## 2021-09-15 ENCOUNTER — Ambulatory Visit (HOSPITAL_BASED_OUTPATIENT_CLINIC_OR_DEPARTMENT_OTHER): Payer: 59 | Admitting: Physical Therapy

## 2021-09-15 DIAGNOSIS — M25612 Stiffness of left shoulder, not elsewhere classified: Secondary | ICD-10-CM

## 2021-09-15 DIAGNOSIS — M6281 Muscle weakness (generalized): Secondary | ICD-10-CM

## 2021-09-15 DIAGNOSIS — M25512 Pain in left shoulder: Secondary | ICD-10-CM | POA: Diagnosis not present

## 2021-09-15 NOTE — Therapy (Signed)
OUTPATIENT PHYSICAL THERAPY TREATMENT NOTE   Patient Name: Brian TAL Sr. MRN: [id number] DOB:[birth year], 55 y.o., male Today's Date: 09/16/2021  PCP: Wanda Plump, MD   PT End of Session - 09/15/21 [phone]     Visit Number 5    Number of Visits 32    Date for PT Re-Evaluation 11/22/21    Authorization Type [employer]    PT Start Time 0856    PT Stop Time 0929    PT Time Calculation (min) 33 min    Activity Tolerance Patient tolerated treatment well    Behavior During Therapy [facility] for tasks assessed/performed             Past Medical History:  Diagnosis Date   Asthma    Borderline diabetes    CAD (coronary artery disease)    a. s/p anterior STEMI in 09/2011 with aspiration of occluded LAD with DES placed at that time, CTO of RCA noted. EF at 30%   Chest pain    CHF (congestive heart failure) (HCC)    Gout    Granulomatosis    chronic granulomatosis (dx at age 9 @ [facility])   Ischemic cardiomyopathy    a. EF at 30% by cath in 09/2011. Has refused echos in the past.    MI (myocardial infarction) (HCC)    Obesity    SOB (shortness of breath)    Past Surgical History:  Procedure Laterality Date   CHOLECYSTECTOMY     LEFT HEART CATHETERIZATION WITH CORONARY ANGIOGRAM N/A 10/18/2011   Procedure: LEFT HEART CATHETERIZATION WITH CORONARY ANGIOGRAM;  Surgeon: Corky Crafts, MD;  Location: [facility] CATH LAB;  Service: Cardiovascular;  Laterality: N/A;   LUNG BIOPSY  1990s   PERCUTANEOUS CORONARY STENT INTERVENTION (PCI-S) N/A 10/18/2011   Procedure: PERCUTANEOUS CORONARY STENT INTERVENTION (PCI-S);  Surgeon: Corky Crafts, MD;  Location: [facility] CATH LAB;  Service: Cardiovascular;  Laterality: N/A;   Patient Active Problem List   Diagnosis Date Noted   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    Subacromial bursitis of left shoulder joint    Left shoulder pain 07/10/2021   PCP NOTES >>>>>>>>>>>>>>>> 05/03/2017   Hemorrhoids 02/14/2012   CAD (coronary  artery disease) 12/01/2011   Hyperlipidemia 12/01/2011   Ischemic cardiomyopathy 12/01/2011   Asthma 10/25/2011   Borderline diabetes 10/25/2011   Acute MI, anterior wall (HCC) 10/21/2011   Coronary atherosclerosis of native coronary artery 10/21/2011   Obesity, unspecified 10/21/2011   GOUT 02/24/2010   PCP: Wanda Plump, MD   REFERRING PROVIDER: Huel Cote, MD   REFERRING DIAG: [phone] (ICD-10-CM) - Left shoulder pain, unspecified chronicity; M75.102 (ICD-10-CM) - Unspecified rotator cuff tear or rupture of left shoulder   THERAPY DIAG:  Left shoulder pain, unspecified chronicity   Stiffness of left shoulder, not elsewhere classified   Muscle weakness (generalized)     ONSET DATE: 08/20/2021:  Left shoulder arthroscopy with superior capsular reconstruction, subacromial decompression with acromioplasty, and limited shoulder debridement.   SUBJECTIVE:  SUBJECTIVE STATEMENT: -Pt is 3 weeks and 5 days s/p L shoulder superior capsular reconstruction and subacromial decompression with acromioplasty.  Pt is sling dependent and is limited with all self care activities and ADLs/IADLs.  Pt unable to perform reaching and overhead activities.  Pt unable to fish and hunt.  He is unable to perform his occupational activities.   -Pt reports no increased pain after prior Rx but was a little sore.  He could tell we stretched it out in therapy.  Pt reports he hasn't been having any pain and denies pain currently. He has not been using pain meds and has 3-4 days left on aspirin.  Pt reports compliance with HEP.  Pt saw MD on 09/03/21 and MD note indicated to continue nonweightbearing at this time passive range of motion per protocol      PERTINENT HISTORY: L shoulder superior capsular reconstruction (08/20/2021).  MI,  CAD, Cadiac surgery with stent placement in 2013, obesity   PAIN:  Are you having pain? No NPRS scale: 0/10 current Pain location: shoulder Pain orientation: Left      PRECAUTIONS: Other: Per Dr. Serena Croissant large RCR protocol.  Also superior capsular reconstruction protocol.   WEIGHT BEARING RESTRICTIONS Yes NWB L UE   OBJECTIVE:   TODAY'S TREATMENT:   OBSERVATION: Incision intact and dry with steri strips over it.  Therapeutic Exercise: -Reviewed response to prior Rx, HEP compliance, pain level, and current function.   -Reviewed and Updated HEP.  Pt performed pendulums cw, ccw, and f/b x 20 reps each, elbow flex/ext AAROM 2x10 reps, and towel gripping x20 reps seated with arm supported. -Pt received L shoulder flexion and ER PROM within protocol limits  -Assessed L shouler PROM:  ER:  40 degrees  -See below for pt education   PATIENT EDUCATION: Education details: post op and protocol restrictions including to maintain L UE in sling and not to use L UE with activity.  Objective findings and ROM limits per protocol.  Updated HEP and gave pt a handout.  Educated pt in correct form and appropriate frequency. Instructed pt to cont with HEP.      Person educated: Patient  Education method: Explanation, Demonstration, Verbal cues, handout Education comprehension: verbalized understanding, returned demonstration, and verbal cues required     HOME EXERCISE PROGRAM: Access Code: [email or web address] URL: [email or web address] Date: 08/24/2021. Updated 09/15/2021 Prepared by: Aaron Edelman   Exercises Circular Shoulder Pendulum with Table Support - 3 x daily - 7 x weekly - 2 sets - 10 reps Flexion-Extension Shoulder Pendulum with Table Support - 3 x daily - 7 x weekly - 2 sets - 10 reps Wrist AROM Flexion Extension - 3 x daily - 7 x weekly - 2-3 sets - 10 reps Seated Gripping Towel - 2-3 x daily - 7 x weekly - 2-3 sets - 10 reps (or red ball)  Pt instructed to gently  squeeze Elbow flexion and extension AAROM seated 2 x daily - 2 sets - 10 reps     ASSESSMENT:   CLINICAL IMPRESSION: Patient is doing well and continues to make great progress with L shoulder PROM.  He tolerates PROM well and has reached ER PROM limits per protocol.  Pt states he is not having any pain and has not been using pain meds.  Pt responded well to Rx having no pain after Rx.  He states the PROM feels good to his shoulder.  PT updated HEP with elbow AAROM.  Pt should benefit from cont skilled PT services  per protocol to address ongoing goals and to assist in restoring PLOF.        GOALS:     SHORT TERM GOALS:   STG Name Target Date Goal status  1 Pt will be independent and compliant with HEP for improved pain, ROM, strength, and function.  Baseline:  09/14/2021 INITIAL  2 Pt will demo improved L shoulder PROM in flexion to 90 deg and ER to 25 deg for improved ROM and stiffness.   Baseline:  09/14/2021 INITIAL  3 Pt will demo improved L shoulder PROM in flexion to 120 deg and ER to 40 deg for improved ROM, mobility, and stiffness. Baseline: 09/28/2021 INITIAL  4 Pt will wean out of sling without adverse effects Baseline: 10/05/2021 INITIAL  5 Pt will tolerate the initiation of AAROM without adverse effects for improved mobility and progression of protocol to improve fxn.. Baseline: 10/05/2021 INITIAL  6 Pt will demo at least 140 deg of supine flexion AAROM and 45 deg ER for improved mobility and stiffness Baseline: 10/19/2021 INITIAL  7 Pt will demo at least 120 deg of flexion and scaption AAROM in standing for improved reaching Baseline: 10/26/2021 INITIAL  8 Pt will be able to actively elevate L UE > 90 deg without significant shoulder hike or significant pain 11/09/2021 INITIAL  9 Pt will be able to perform his self care activities with no > than minimal difficulty 11/02/2021 INITIAL    LONG TERM GOALS:    LTG Name Target Date Goal status  1 Pt will be able to perform his  ADLs and IADLs without significant pain or difficulty.  12/14/2021 Baseline: 12/14/2021 INITIAL  2 Pt will be able to actively reach into an overhead cabinet without significant difficulty or shoulder hike.  Baseline: 11/23/2021 INITIAL  3 Pt will be able to perform his normal reaching and overhead activities without significant pain or difficulty.  Baseline: 12/14/2021 INITIAL  4 Pt will demo 4/5 to 4+/5 MMT strength t/o L shoulder to assist with returning to functional carrying and lifting and to assist with returning to work activities with expected limitations.  Baseline: 12/14/2020 INITIAL                               PLAN:    PLANNED INTERVENTIONS: Therapeutic exercises, Therapeutic activity, Neuro Muscular re-education, Patient/Family education, Joint mobilization, Electrical stimulation, Cryotherapy, Moist heat, scar mobilization, Taping, and Manual therapy   PLAN FOR NEXT SESSION: MD messaged PT to use his large RCR repair protocol.  Also consider superior capsular reconstruction protocol.  Cont with PROM per protocol and cont 1x/wk.  Ice to reduce pain and soreness.      Audie Clear III PT, DPT 09/16/21 7:01 AM

## 2021-09-21 NOTE — Therapy (Addendum)
OUTPATIENT PHYSICAL THERAPY TREATMENT NOTE   Patient Name: Brian Gross. MRN: [id number] DOB:10/24/1965, 55 y.o., male Today's Date: 09/22/2021  PCP: Colon Branch, MD   PT End of Session - 09/22/21 [phone]     Visit Number 6    Number of Visits 32    Date for PT Re-Evaluation 11/22/21    Authorization Type [employer]    Progress Note Due on Visit --   09/23/2021   PT Start Time 0851    PT Stop Time 0931    PT Time Calculation (min) 40 min    Activity Tolerance Patient tolerated treatment well    Behavior During Therapy [facility] for tasks assessed/performed              Past Medical History:  Diagnosis Date   Asthma    Borderline diabetes    CAD (coronary artery disease)    a. s/p anterior STEMI in 09/2011 with aspiration of occluded LAD with DES placed at that time, CTO of RCA noted. EF at 30%   Chest pain    CHF (congestive heart failure) ([place])    Gout    Granulomatosis    chronic granulomatosis (dx at age 50 @ [place])   Ischemic cardiomyopathy    a. EF at 30% by cath in 09/2011. Has refused echos in the past.    MI (myocardial infarction) ([place])    Obesity    SOB (shortness of breath)    Past Surgical History:  Procedure Laterality Date   CHOLECYSTECTOMY     LEFT HEART CATHETERIZATION WITH CORONARY ANGIOGRAM N/A 10/18/2011   Procedure: LEFT HEART CATHETERIZATION WITH CORONARY ANGIOGRAM;  Surgeon: Jettie Booze, MD;  Location[facility] CATH LAB;  Service: Cardiovascular;  Laterality: N/A;   LUNG BIOPSY  1990s   PERCUTANEOUS CORONARY STENT INTERVENTION (PCI-S) N/A 10/18/2011   Procedure: PERCUTANEOUS CORONARY STENT INTERVENTION (PCI-S);  Surgeon: Jettie Booze, MD;  Location: [facility] CATH LAB;  Service: Cardiovascular;  Laterality: N/A;   Patient Active Problem List   Diagnosis Date Noted   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    Subacromial bursitis of left shoulder joint    Left shoulder pain 07/10/2021   PCP NOTES >>>>>>>>>>>>>>>>  05/03/2017   Hemorrhoids 02/14/2012   CAD (coronary artery disease) 12/01/2011   Hyperlipidemia 12/01/2011   Ischemic cardiomyopathy 12/01/2011   Asthma 10/25/2011   Borderline diabetes 10/25/2011   Acute MI, anterior wall ([place]) 10/21/2011   Coronary atherosclerosis of native coronary artery 10/21/2011   Obesity, unspecified 10/21/2011   GOUT 02/24/2010   PCP: Colon Branch, MD   REFERRING PROVIDER: Vanetta Mulders, MD   REFERRING DIAG: [phone] (ICD-10-CM) - Left shoulder pain, unspecified chronicity; M75.102 (ICD-10-CM) - Unspecified rotator cuff tear or rupture of left shoulder   THERAPY DIAG:  Left shoulder pain, unspecified chronicity   Stiffness of left shoulder, not elsewhere classified   Muscle weakness (generalized)     ONSET DATE: 08/20/2021:  Left shoulder arthroscopy with superior capsular reconstruction, subacromial decompression with acromioplasty, and limited shoulder debridement.   SUBJECTIVE:  SUBJECTIVE STATEMENT: -Pt is 4 weeks and 5 days s/p L shoulder superior capsular reconstruction and subacromial decompression with acromioplasty.  Pt is sling dependent and is limited with all self care activities and ADLs/IADLs.  Pt unable to perform reaching and overhead activities.  Pt unable to fish and hunt.  He is unable to perform his occupational activities.   -Pt denies any adverse effects after prior Rx.  Pt reports compliance with HEP.  Pt saw MD on 09/03/21 and MD note indicated to continue nonweightbearing at this time passive range of motion per protocol. -Pt states his shoulder was very sore a couple of days ago.  He tried to lift his L UE some out of his sling which is why it might be sore.  PT instructed pt to not lift or reach with L UE.  Pt denies pain currently.       PERTINENT  HISTORY: L shoulder superior capsular reconstruction (08/20/2021).  MI, CAD, Cadiac surgery with stent placement in 2013, obesity   PAIN:  Are you having pain? No NPRS scale: 0/10 current Pain location: shoulder Pain orientation: Left      PRECAUTIONS: Other: Per Dr. Eddie Dibbles large RCR protocol.  Also superior capsular reconstruction protocol.   WEIGHT BEARING RESTRICTIONS Yes NWB L UE   OBJECTIVE:   TODAY'S TREATMENT:   OBSERVATION: Pt wanted PT to check his incision.  His incision is healing nicely.  Pt has scabbing.  It is intact and dry with 2 steri strips left.  Reassured pt that his incision is healing well.  Therapeutic Exercise: -Reviewed response to prior Rx, HEP compliance, pain level, and current function.   -Reviewed and Updated HEP.  Pt performed pendulums cw, ccw, and f/b x 20 reps each, elbow flex/ext AAROM/AROM 2x10 reps, and seated scapular retractions 2x10-12 reps with arm supported. -Pt received gentle L shoulder distraction with oscillations f/b L shoulder flexion, abduction, and ER PROM per protocol ranges   -Assessed L shouler PROM:  Flexion: 134 deg, ER:  40 degrees  -See below for pt education   PATIENT EDUCATION: Education details:  PT instructed pt to not lift or reach with L UE.  Answered pt's questions.  post op and protocol restrictions including to maintain L UE in sling and not to use L UE with activity.  Objective findings and ROM limits per protocol.  Updated HEP and gave pt a handout.  Educated pt in correct form and appropriate frequency. Instructed pt to cont with HEP.      Person educated: Patient  Education method: Explanation, Demonstration, Verbal cues, and handout Education comprehension: verbalized understanding, returned demonstration, and verbal cues required     HOME EXERCISE PROGRAM: Access Code: [email or web address] URL: [email or web address] Date: 09/22/2021 Prepared by: Ronny Flurry  Exercises Circular Shoulder Pendulum  with Table Support - 3 x daily - 7 x weekly - 2 sets - 10 reps Flexion-Extension Shoulder Pendulum with Table Support - 3 x daily - 7 x weekly - 2 sets - 10 reps Wrist AROM Flexion Extension - 3 x daily - 7 x weekly - 2-3 sets - 10 reps Seated Gripping Towel - 2-3 x daily - 7 x weekly - 2-3 sets - 10 reps SEATED ELBOW FLEXION AND EXTENSION AAROM - 2 x daily - 7 x weekly - 2 sets - 10 reps Seated Scapular Retraction - 1-2 x daily - 7 x weekly - 2 sets - 10 reps     ASSESSMENT:   CLINICAL IMPRESSION:   Pt was  a little tight and sore initially with shoulder flexion PROM which quickly improved with increased reps of PROM.  Pt continues to make excellent progress with shoulder PROM progressing appropriately with protocol.   Pt reports feeling better after PROM and states it feels good to stretch.  Pt reports having some soreness in L UE at home though was trying to lift L UE some.  PT instructed pt to not perform any AROM or elevation of L shoulder.  Pt has met STG's #1,2, and 3.  Pt responded well to Rx having no pain after Rx.  Pt should benefit from cont skilled PT services per protocol to address ongoing goals and to assist in restoring PLOF.       GOALS:     SHORT TERM GOALS:   STG Name Target Date Goal status  1 Pt will be independent and compliant with HEP for improved pain, ROM, strength, and function.  Baseline:  09/14/2021 GOAL MET  2 Pt will demo improved L shoulder PROM in flexion to 90 deg and ER to 25 deg for improved ROM and stiffness.   Baseline:  09/14/2021 GOAL MET  3 Pt will demo improved L shoulder PROM in flexion to 120 deg and ER to 40 deg for improved ROM, mobility, and stiffness. Baseline: 09/28/2021 GOAL MET  4 Pt will wean out of sling without adverse effects Baseline: 10/05/2021 INITIAL  5 Pt will tolerate the initiation of AAROM without adverse effects for improved mobility and progression of protocol to improve fxn.. Baseline: 10/05/2021 INITIAL  6 Pt will demo  at least 140 deg of supine flexion AAROM and 45 deg ER for improved mobility and stiffness Baseline: 10/19/2021 INITIAL  7 Pt will demo at least 120 deg of flexion and scaption AAROM in standing for improved reaching Baseline: 10/26/2021 INITIAL  8 Pt will be able to actively elevate L UE > 90 deg without significant shoulder hike or significant pain 11/09/2021 INITIAL  9 Pt will be able to perform his self care activities with no > than minimal difficulty 11/02/2021 INITIAL    LONG TERM GOALS:    LTG Name Target Date Goal status  1 Pt will be able to perform his ADLs and IADLs without significant pain or difficulty.  12/14/2021 Baseline: 12/14/2021 INITIAL  2 Pt will be able to actively reach into an overhead cabinet without significant difficulty or shoulder hike.  Baseline: 11/23/2021 INITIAL  3 Pt will be able to perform his normal reaching and overhead activities without significant pain or difficulty.  Baseline: 12/14/2021 INITIAL  4 Pt will demo 4/5 to 4+/5 MMT strength t/o L shoulder to assist with returning to functional carrying and lifting and to assist with returning to work activities with expected limitations.  Baseline: 12/14/2020 INITIAL                               PLAN:    PLANNED INTERVENTIONS: Therapeutic exercises, Therapeutic activity, Neuro Muscular re-education, Patient/Family education, Joint mobilization, Electrical stimulation, Cryotherapy, Moist heat, scar mobilization, Taping, and Manual therapy   PLAN FOR NEXT SESSION: MD messaged PT to use his large RCR repair protocol.  Also consider superior capsular reconstruction protocol.  Cont with PROM per protocol once this week and increase to 2x/wk beginning next week.  Ice to reduce pain and soreness.      Selinda Michaels III PT, DPT 09/22/21 9:59 AM

## 2021-09-22 ENCOUNTER — Other Ambulatory Visit: Payer: Self-pay

## 2021-09-22 ENCOUNTER — Ambulatory Visit (HOSPITAL_BASED_OUTPATIENT_CLINIC_OR_DEPARTMENT_OTHER): Payer: 59 | Attending: Orthopaedic Surgery | Admitting: Physical Therapy

## 2021-09-22 ENCOUNTER — Encounter (HOSPITAL_BASED_OUTPATIENT_CLINIC_OR_DEPARTMENT_OTHER): Payer: Self-pay | Admitting: Physical Therapy

## 2021-09-22 DIAGNOSIS — M6281 Muscle weakness (generalized): Secondary | ICD-10-CM | POA: Insufficient documentation

## 2021-09-22 DIAGNOSIS — M25512 Pain in left shoulder: Secondary | ICD-10-CM | POA: Insufficient documentation

## 2021-09-22 DIAGNOSIS — M25612 Stiffness of left shoulder, not elsewhere classified: Secondary | ICD-10-CM | POA: Insufficient documentation

## 2021-09-29 ENCOUNTER — Encounter (HOSPITAL_BASED_OUTPATIENT_CLINIC_OR_DEPARTMENT_OTHER): Payer: Self-pay | Admitting: Physical Therapy

## 2021-09-29 ENCOUNTER — Ambulatory Visit (HOSPITAL_BASED_OUTPATIENT_CLINIC_OR_DEPARTMENT_OTHER): Payer: 59 | Admitting: Physical Therapy

## 2021-09-29 ENCOUNTER — Other Ambulatory Visit: Payer: Self-pay

## 2021-09-29 DIAGNOSIS — M6281 Muscle weakness (generalized): Secondary | ICD-10-CM

## 2021-09-29 DIAGNOSIS — M25512 Pain in left shoulder: Secondary | ICD-10-CM

## 2021-09-29 DIAGNOSIS — M25612 Stiffness of left shoulder, not elsewhere classified: Secondary | ICD-10-CM

## 2021-09-29 NOTE — Therapy (Signed)
OUTPATIENT PHYSICAL THERAPY TREATMENT NOTE/PROGRESS NOTE   Patient Name: Brian BRANDEL Sr. MRN: [id number] DOB:02/18/1966, 55 y.o., male Today's Date: 09/30/2021  PCP: Colon Branch, MD   PT End of Session - 09/29/21 [phone]     Visit Number 7    Number of Visits 32    Date for PT Re-Evaluation 11/22/21    Authorization Type [employer]    Progress Note Due on Visit --   10/30/2021   PT Start Time 0849    PT Stop Time 0929    PT Time Calculation (min) 40 min    Activity Tolerance Patient tolerated treatment well    Behavior During Therapy [facility] for tasks assessed/performed               Past Medical History:  Diagnosis Date   Asthma    Borderline diabetes    CAD (coronary artery disease)    a. s/p anterior STEMI in 09/2011 with aspiration of occluded LAD with DES placed at that time, CTO of RCA noted. EF at 30%   Chest pain    CHF (congestive heart failure) ([place])    Gout    Granulomatosis    chronic granulomatosis (dx at age 75 @ [facility])   Ischemic cardiomyopathy    a. EF at 30% by cath in 09/2011. Has refused echos in the past.    MI (myocardial infarction) ([place])    Obesity    SOB (shortness of breath)    Past Surgical History:  Procedure Laterality Date   CHOLECYSTECTOMY     LEFT HEART CATHETERIZATION WITH CORONARY ANGIOGRAM N/A 10/18/2011   Procedure: LEFT HEART CATHETERIZATION WITH CORONARY ANGIOGRAM;  Surgeon: Jettie Booze, MD;  Location: [facility] CATH LAB;  Service: Cardiovascular;  Laterality: N/A;   LUNG BIOPSY  1990s   PERCUTANEOUS CORONARY STENT INTERVENTION (PCI-S) N/A 10/18/2011   Procedure: PERCUTANEOUS CORONARY STENT INTERVENTION (PCI-S);  Surgeon: Jettie Booze, MD;  Location: [facility] CATH LAB;  Service: Cardiovascular;  Laterality: N/A;   Patient Active Problem List   Diagnosis Date Noted   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    Subacromial bursitis of left shoulder joint    Left shoulder pain 07/10/2021   PCP NOTES  >>>>>>>>>>>>>>>> 05/03/2017   Hemorrhoids 02/14/2012   CAD (coronary artery disease) 12/01/2011   Hyperlipidemia 12/01/2011   Ischemic cardiomyopathy 12/01/2011   Asthma 10/25/2011   Borderline diabetes 10/25/2011   Acute MI, anterior wall ([place]) 10/21/2011   Coronary atherosclerosis of native coronary artery 10/21/2011   Obesity, unspecified 10/21/2011   GOUT 02/24/2010   PCP: Colon Branch, MD   REFERRING PROVIDER: Vanetta Mulders, MD   REFERRING DIAG: [phone] (ICD-10-CM) - Left shoulder pain, unspecified chronicity; M75.102 (ICD-10-CM) - Unspecified rotator cuff tear or rupture of left shoulder   THERAPY DIAG:  Left shoulder pain, unspecified chronicity   Stiffness of left shoulder, not elsewhere classified   Muscle weakness (generalized)     ONSET DATE: 08/20/2021:  Left shoulder arthroscopy with superior capsular reconstruction, subacromial decompression with acromioplasty, and limited shoulder debridement.   SUBJECTIVE:  SUBJECTIVE STATEMENT: -Pt is 5 weeks and 5 days s/p L shoulder superior capsular reconstruction and subacromial decompression with acromioplasty.  Pt is sling dependent and is limited with all self care activities and ADLs/IADLs.  Pt unable to perform reaching and overhead activities.  Pt unable to fish and hunt.  He is unable to perform his occupational activities.   -Pt states he has been trying to lift his L UE some out of his sling.  PT instructed pt to not lift or reach with L UE.  Pt denies pain currently.  He denies any adverse effects after prior Rx.  Pt reports compliance with HEP and reports some soreness with exercises.     PERTINENT HISTORY: L shoulder superior capsular reconstruction (08/20/2021).  MI, CAD, Cadiac surgery with stent placement in 2013, obesity   PAIN:   Are you having pain? No NPRS scale: 0/10 current Pain location: shoulder Pain orientation: Left      PRECAUTIONS: Other: Per Dr. Eddie Dibbles large RCR protocol.  Also superior capsular reconstruction protocol.   WEIGHT BEARING RESTRICTIONS Yes NWB L UE   OBJECTIVE:   TODAY'S TREATMENT:   OBSERVATION: Portals healed well with normal pink color.  His incision is dry and healing nicely.  Pt has some scabbing.    PATIENT SURVEYS:  FOTO 38    Therapeutic Exercise: -Reviewed response to prior Rx, HEP compliance, pain level, and current function.   -Reviewed and Updated HEP.  Pt performed pendulums cw, ccw, and f/b x 20 reps each, elbow flex/ext AROM 2x10 reps, and seated scapular retractions 2x10-12 reps with arm supported. -Pt received gentle L shoulder distraction with oscillations f/b L shoulder flexion, scaption, abduction, and ER PROM per protocol ranges   -See below for pt education   -Assessed L shouler PROM:  Flexion: 140 deg, Abd: 90 deg limited per protocol (empty end feel), ER:  40 degrees     PATIENT EDUCATION: Education details:  PT again instructed pt to not lift or reach with L UE.  Answered pt's questions.  post op and protocol restrictions including to maintain L UE in sling and not to use L UE with activity.  Objective findings and ROM limits per protocol.  Instructed pt to cont with HEP.      Person educated: Patient  Education method: Explanation, Demonstration, Verbal cues Education comprehension: verbalized understanding, returned demonstration, and verbal cues required     HOME EXERCISE PROGRAM: Access Code: [email or web address] URL: [email or web address] Date: 09/22/2021 Prepared by: Ronny Flurry  Exercises Circular Shoulder Pendulum with Table Support - 3 x daily - 7 x weekly - 2 sets - 10 reps Flexion-Extension Shoulder Pendulum with Table Support - 3 x daily - 7 x weekly - 2 sets - 10 reps Wrist AROM Flexion Extension - 3 x daily - 7 x weekly  - 2-3 sets - 10 reps Seated Gripping Towel - 2-3 x daily - 7 x weekly - 2-3 sets - 10 reps SEATED ELBOW FLEXION AND EXTENSION AAROM - 2 x daily - 7 x weekly - 2 sets - 10 reps Seated Scapular Retraction - 1-2 x daily - 7 x weekly - 2 sets - 10 reps     ASSESSMENT:   CLINICAL IMPRESSION:   Pt was tight initially with shoulder flexion PROM which improved with increased reps of PROM.  Pt continues to make excellent progress with shoulder PROM progressing appropriately with protocol.  Pt has reached limits of protocol at this time.  Pt has been trying to lift/elevate  L UE some out of the sling and PT instructed pt to not perform any AROM or elevation of L shoulder.  Pt reports feeling better after PROM.  Pt has met STG's #1,2, and 3.  Pt responded well to Rx having no pain after Rx.  Pt should benefit from cont skilled PT services per protocol to address ongoing goals and to assist in restoring PLOF.       GOALS:     SHORT TERM GOALS:   STG Name Target Date Goal status  1 Pt will be independent and compliant with HEP for improved pain, ROM, strength, and function.  Baseline:  09/14/2021 GOAL MET  2 Pt will demo improved L shoulder PROM in flexion to 90 deg and ER to 25 deg for improved ROM and stiffness.   Baseline:  09/14/2021 GOAL MET  3 Pt will demo improved L shoulder PROM in flexion to 120 deg and ER to 40 deg for improved ROM, mobility, and stiffness. Baseline: 09/28/2021 GOAL MET  4 Pt will wean out of sling without adverse effects Baseline: 10/05/2021 INITIAL  5 Pt will tolerate the initiation of AAROM without adverse effects for improved mobility and progression of protocol to improve fxn.. Baseline: 10/05/2021 INITIAL  6 Pt will demo at least 140 deg of supine flexion AAROM and 45 deg ER for improved mobility and stiffness Baseline: 10/19/2021 INITIAL  7 Pt will demo at least 120 deg of flexion and scaption AAROM in standing for improved reaching Baseline: 10/26/2021 INITIAL  8 Pt  will be able to actively elevate L UE > 90 deg without significant shoulder hike or significant pain 11/09/2021 INITIAL  9 Pt will be able to perform his self care activities with no > than minimal difficulty 11/02/2021 INITIAL    LONG TERM GOALS:    LTG Name Target Date Goal status  1 Pt will be able to perform his ADLs and IADLs without significant pain or difficulty.  12/14/2021 Baseline: 12/14/2021 INITIAL  2 Pt will be able to actively reach into an overhead cabinet without significant difficulty or shoulder hike.  Baseline: 11/23/2021 INITIAL  3 Pt will be able to perform his normal reaching and overhead activities without significant pain or difficulty.  Baseline: 12/14/2021 INITIAL  4 Pt will demo 4/5 to 4+/5 MMT strength t/o L shoulder to assist with returning to functional carrying and lifting and to assist with returning to work activities with expected limitations.  Baseline: 12/14/2020 INITIAL                               PLAN: FREQUENCY:  2 times per week  DURATION:  11 weeks   PLANNED INTERVENTIONS: Therapeutic exercises, Therapeutic activity, Neuro Muscular re-education, Patient/Family education, Joint mobilization, Electrical stimulation, Cryotherapy, Moist heat, scar mobilization, Taping, and Manual therapy   PLAN FOR NEXT SESSION: MD messaged PT to use his large RCR repair protocol.  Also consider superior capsular reconstruction protocol. Pt sees MD later this week.  Progress with AAROM per protocol at 6 weeks and cont with PROM per protocol.  Increase frequency to 2x/wk.   Ice to reduce pain and soreness.      Selinda Michaels III PT, DPT 09/30/21 11:50 AM

## 2021-10-02 ENCOUNTER — Ambulatory Visit (INDEPENDENT_AMBULATORY_CARE_PROVIDER_SITE_OTHER): Payer: 59 | Admitting: Orthopaedic Surgery

## 2021-10-02 ENCOUNTER — Other Ambulatory Visit: Payer: Self-pay

## 2021-10-02 DIAGNOSIS — M12812 Other specific arthropathies, not elsewhere classified, left shoulder: Secondary | ICD-10-CM

## 2021-10-02 DIAGNOSIS — M75102 Unspecified rotator cuff tear or rupture of left shoulder, not specified as traumatic: Secondary | ICD-10-CM

## 2021-10-02 NOTE — Progress Notes (Signed)
Post Operative Evaluation    Procedure/Date of Surgery: 08/20/21 left superior capsular reconstruction  Interval History:    Brian Gross presents next week status post left superior capsular reconstruction.  He has been compliant with sling usage.  He has been out of his sling for passive range of motion.  Overall he is doing very well.  He does not have any pain.  Therapy has been progressing as tolerated.   PMH/PSH/Family History/Social History/Meds/Allergies:    Past Medical History:  Diagnosis Date   Asthma    Borderline diabetes    CAD (coronary artery disease)    a. s/p anterior STEMI in 09/2011 with aspiration of occluded LAD with DES placed at that time, CTO of RCA noted. EF at 30%   Chest pain    CHF (congestive heart failure) ([facility])    Gout    Granulomatosis    chronic granulomatosis (dx at age 22 @ [facility])   Ischemic cardiomyopathy    a. EF at 30% by cath in 09/2011. Has refused echos in the past.    MI (myocardial infarction) ([facility])    Obesity    SOB (shortness of breath)    Past Surgical History:  Procedure Laterality Date   CHOLECYSTECTOMY     LEFT HEART CATHETERIZATION WITH CORONARY ANGIOGRAM N/A 10/18/2011   Procedure: LEFT HEART CATHETERIZATION WITH CORONARY ANGIOGRAM;  Surgeon: Corky Crafts, MD;  Location: [facility] C[facility] LAB;  Service: Cardiovascular;  Laterality: N/A;   LUNG BIOPSY  1990s   PERCUTANEOUS CORONARY STENT INTERVENTION (PCI-S) N/A 10/18/2011   Procedure: PERCUTANEOUS CORONARY STENT INTERVENTION (PCI-S);  Surgeon: Corky Crafts, MD;  Location: [facility] CATH LAB;  Service: Cardiovascular;  Laterality: N/A;   Social History   Socioeconomic History   Marital status: Married    Spouse name: Not on file   Number of children: 1   Years of education: Not on file   Highest education level: Not on file  Occupational History   Occupation: Flooring  Tobacco Use   Smoking status: Former    Types: Cigarettes     Quit date: 10/17/1989    Years since quitting: 31.9   Smokeless tobacco: Never  Vaping Use   Vaping Use: Never used  Substance and Sexual Activity   Alcohol use: Not Currently    Alcohol/week: 1.0 standard drink    Types: 1 Cans of beer per week   Drug use: Yes    Frequency: 7.0 times per week    Types: Marijuana    Comment: daily   Sexual activity: Not on file  Other Topics Concern   Not on file  Social History Narrative            Social Determinants of Health   Financial Resource Strain: Not on file  Food Insecurity: Not on file  Transportation Needs: Not on file  Physical Activity: Not on file  Stress: Not on file  Social Connections: Not on file   Family History  Problem Relation Age of Onset   Coronary artery disease Father 71       CABG   Heart attack Father    Healthy Mother    Colon cancer Neg Hx    Prostate cancer Neg Hx    Allergies  Allergen Reactions   Morphine Nausea And Vomiting   Current Outpatient  Medications  Medication Sig Dispense Refill   acetaminophen (TYLENOL) 500 MG tablet Take 500 mg by mouth every 6 (six) hours as needed for moderate pain.     albuterol (VENTOLIN HFA) 108 (90 Base) MCG/ACT inhaler Inhale 1-2 puffs into the lungs every 6 (six) hours as needed for wheezing or shortness of breath. 18 g 2   aspirin EC 325 MG tablet Take 1 tablet (325 mg total) by mouth daily. 30 tablet 0   furosemide (LASIX) 20 MG tablet TAKE 1 TABLET BY MOUTH EVERY DAY 90 tablet 2   losartan (COZAAR) 100 MG tablet Take 1 tablet (100 mg total) by mouth daily. 90 tablet 3   metoprolol succinate (TOPROL-XL) 100 MG 24 hr tablet Take 1 tablet (100 mg total) by mouth daily. Take with or immediately following a meal. 90 tablet 2   nitroGLYCERIN (NITROSTAT) 0.4 MG SL tablet Place 1 tablet (0.4 mg total) under the tongue every 5 (five) minutes as needed for chest pain. 25 tablet 3   oxyCODONE (OXY IR/ROXICODONE) 5 MG immediate release tablet Take 1 tablet (5 mg total)  by mouth every 4 (four) hours as needed (severe pain). 20 tablet 0   rosuvastatin (CRESTOR) 20 MG tablet TAKE 1 TABLET BY MOUTH EVERY DAY 90 tablet 3   No current facility-administered medications for this visit.   No results found.  Review of Systems:   A ROS was performed including pertinent positives and negatives as documented in the HPI.   Musculoskeletal Exam:    There were no vitals taken for this visit.  Portals are healed.  Is able to flex and extend at the elbow.  2+ radial pulse.  Fingers warm and well-perfused.  Sensation is intact in all distributions of the left arm  Passively I am able to elevate him to 150 degrees and he is able to maintain this position.  External rotation at the side is to 45 degrees internal rotation is to the sacrum.  Imaging:    None  I personally reviewed and interpreted the radiographs.   Assessment:   55 year old male 6 weeks status post left superior capsular reconstruction.  At this time he may be out of the sling.  He may progress to active range of motion about the left shoulder.  He was shown exercises to promote this.  He may participate in aquatic therapy in order to improve this range of motion as well.  I will see him back in 6 weeks  Plan :    -Return to clinic in 6 weeks   I personally saw and evaluated the patient, and participated in the management and treatment plan.  Huel Cote, MD Attending Physician, Orthopedic Surgery  This document was dictated using Dragon voice recognition software. A reasonable attempt at proof reading has been made to minimize errors.

## 2021-10-06 ENCOUNTER — Encounter (HOSPITAL_BASED_OUTPATIENT_CLINIC_OR_DEPARTMENT_OTHER): Payer: Self-pay | Admitting: Physical Therapy

## 2021-10-06 ENCOUNTER — Ambulatory Visit (HOSPITAL_BASED_OUTPATIENT_CLINIC_OR_DEPARTMENT_OTHER): Payer: 59 | Admitting: Physical Therapy

## 2021-10-06 ENCOUNTER — Other Ambulatory Visit: Payer: Self-pay

## 2021-10-06 DIAGNOSIS — M25512 Pain in left shoulder: Secondary | ICD-10-CM

## 2021-10-06 DIAGNOSIS — M6281 Muscle weakness (generalized): Secondary | ICD-10-CM

## 2021-10-06 DIAGNOSIS — M25612 Stiffness of left shoulder, not elsewhere classified: Secondary | ICD-10-CM

## 2021-10-06 NOTE — Therapy (Signed)
OUTPATIENT PHYSICAL THERAPY TREATMENT  NOTE   Patient Name: Brian ZUKOWSKI Sr. MRN: [id number] DOB:1966/07/11, 55 y.o., male Today's Date: 10/06/2021  PCP: Colon Branch, MD   PT End of Session - 10/06/21 1107     Visit Number 8    Number of Visits 32    Date for PT Re-Evaluation 11/22/21    Authorization Type [employer]    Progress Note Due on Visit --   10/30/2021   PT Start Time 1015    PT Stop Time 1100    PT Time Calculation (min) 45 min    Activity Tolerance Patient tolerated treatment well    Behavior During Therapy [facility] for tasks assessed/performed                Past Medical History:  Diagnosis Date   Asthma    Borderline diabetes    CAD (coronary artery disease)    a. s/p anterior STEMI in 09/2011 with aspiration of occluded LAD with DES placed at that time, CTO of RCA noted. EF at 30%   Chest pain    CHF (congestive heart failure) ([place])    Gout    Granulomatosis    chronic granulomatosis (dx at age 26 @ [place])   Ischemic cardiomyopathy    a. EF at 30% by cath in 09/2011. Has refused echos in the past.    MI (myocardial infarction) ([place])    Obesity    SOB (shortness of breath)    Past Surgical History:  Procedure Laterality Date   CHOLECYSTECTOMY     LEFT HEART CATHETERIZATION WITH CORONARY ANGIOGRAM N/A 10/18/2011   Procedure: LEFT HEART CATHETERIZATION WITH CORONARY ANGIOGRAM;  Surgeon: Jettie Booze, MD;  Location: [facility] CATH LAB;  Service: Cardiovascular;  Laterality: N/A;   LUNG BIOPSY  1990s   PERCUTANEOUS CORONARY STENT INTERVENTION (PCI-S) N/A 10/18/2011   Procedure: PERCUTANEOUS CORONARY STENT INTERVENTION (PCI-S);  Surgeon: Jettie Booze, MD;  Location: [facility] CATH LAB;  Service: Cardiovascular;  Laterality: N/A;   Patient Active Problem List   Diagnosis Date Noted   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    Subacromial bursitis of left shoulder joint    Left shoulder pain 07/10/2021   PCP NOTES >>>>>>>>>>>>>>>>  05/03/2017   Hemorrhoids 02/14/2012   CAD (coronary artery disease) 12/01/2011   Hyperlipidemia 12/01/2011   Ischemic cardiomyopathy 12/01/2011   Asthma 10/25/2011   Borderline diabetes 10/25/2011   Acute MI, anterior wall ([place]) 10/21/2011   Coronary atherosclerosis of native coronary artery 10/21/2011   Obesity, unspecified 10/21/2011   GOUT 02/24/2010   PCP: Colon Branch, MD   REFERRING PROVIDER: Vanetta Mulders, MD   REFERRING DIAG: [phone] (ICD-10-CM) - Left shoulder pain, unspecified chronicity; M75.102 (ICD-10-CM) - Unspecified rotator cuff tear or rupture of left shoulder   THERAPY DIAG:  Left shoulder pain, unspecified chronicity   Stiffness of left shoulder, not elsewhere classified   Muscle weakness (generalized)     ONSET DATE: 08/20/2021:  Left shoulder arthroscopy with superior capsular reconstruction, subacromial decompression with acromioplasty, and limited shoulder debridement.   SUBJECTIVE:  SUBJECTIVE STATEMENT: -Pt is 5 weeks and 5 days s/p L shoulder superior capsular reconstruction and subacromial decompression with acromioplasty.  Pt is sling dependent and is limited with all self care activities and ADLs/IADLs.  Pt unable to perform reaching and overhead activities.  Pt unable to fish and hunt.  He is unable to perform his occupational activities.     -Pt states arm is moving well. No longer in the sling. He has been able to lift arm to about shoulder height without help.   PERTINENT HISTORY: L shoulder superior capsular reconstruction (08/20/2021).  MI, CAD, Cadiac surgery with stent placement in 2013, obesity   PAIN:  Are you having pain? No NPRS scale: 0/10 current Pain location: shoulder Pain orientation: Left      PRECAUTIONS: Other: Per Dr. Eddie Dibbles large RCR  protocol.  Also superior capsular reconstruction protocol.   WEIGHT BEARING RESTRICTIONS Yes NWB L UE   OBJECTIVE:   TODAY'S TREATMENT:   OBSERVATION: Portals healed well with normal pink color.  His incision is dry and healing nicely.  Pt has some scabbing.    PATIENT SURVEYS:  FOTO 38    Therapeutic Exercise: Joint mobilization: inf and post grade III  AAROM flexion 5s 15x Table ER 5s 15x Table ABD 5s 15x Table flexion 5s 15x Pulleys 3 min scaption PROM to tolerance   -Assessed L shouler PROM:  Flexion: 155 deg, Abd: 120 deg limited per protocol, ER:  40 degrees     PATIENT EDUCATION: Education details:  protocol, exercise progression, DOMS expectations, muscle firing,  envelope of function, HEP, POC Person educated: Patient  Education method: Explanation, Demonstration, Verbal cues Education comprehension: verbalized understanding, returned demonstration, and verbal cues required     HOME EXERCISE PROGRAM: Access Code: [email or web address] URL: [email or web address] Date: 09/22/2021 Prepared by: Ronny Flurry  Exercises Circular Shoulder Pendulum with Table Support - 3 x daily - 7 x weekly - 2 sets - 10 reps Flexion-Extension Shoulder Pendulum with Table Support - 3 x daily - 7 x weekly - 2 sets - 10 reps Wrist AROM Flexion Extension - 3 x daily - 7 x weekly - 2-3 sets - 10 reps Seated Gripping Towel - 2-3 x daily - 7 x weekly - 2-3 sets - 10 reps SEATED ELBOW FLEXION AND EXTENSION AAROM - 2 x daily - 7 x weekly - 2 sets - 10 reps Seated Scapular Retraction - 1-2 x daily - 7 x weekly - 2 sets - 10 reps     ASSESSMENT:   CLINICAL IMPRESSION:   Pt able to progress to next phase of therapy at this time with focus on AAROM and improving PROM in the L shoulder. Pt able to incorporate CKC stretching and OKC stretching without pain. Pt able to increase OH ROM by end of session without irritation of the shoulder. Plan to revisit ROM exercise and updated HEP as  well as consider adding in wrist and bicep loaded exercise. Per MD, pt able to start AROM as well. Pt should benefit from cont skilled PT services per protocol to address ongoing goals and to assist in restoring PLOF.       GOALS:     SHORT TERM GOALS:   STG Name Target Date Goal status  1 Pt will be independent and compliant with HEP for improved pain, ROM, strength, and function.  Baseline:  09/14/2021 GOAL MET  2 Pt will demo improved L shoulder PROM in flexion to 90 deg and ER to 25 deg for improved  ROM and stiffness.   Baseline:  09/14/2021 GOAL MET  3 Pt will demo improved L shoulder PROM in flexion to 120 deg and ER to 40 deg for improved ROM, mobility, and stiffness. Baseline: 09/28/2021 GOAL MET  4 Pt will wean out of sling without adverse effects Baseline: 10/05/2021 INITIAL  5 Pt will tolerate the initiation of AAROM without adverse effects for improved mobility and progression of protocol to improve fxn.. Baseline: 10/05/2021 INITIAL  6 Pt will demo at least 140 deg of supine flexion AAROM and 45 deg ER for improved mobility and stiffness Baseline: 10/19/2021 INITIAL  7 Pt will demo at least 120 deg of flexion and scaption AAROM in standing for improved reaching Baseline: 10/26/2021 INITIAL  8 Pt will be able to actively elevate L UE > 90 deg without significant shoulder hike or significant pain 11/09/2021 INITIAL  9 Pt will be able to perform his self care activities with no > than minimal difficulty 11/02/2021 INITIAL    LONG TERM GOALS:    LTG Name Target Date Goal status  1 Pt will be able to perform his ADLs and IADLs without significant pain or difficulty.  12/14/2021 Baseline: 12/14/2021 INITIAL  2 Pt will be able to actively reach into an overhead cabinet without significant difficulty or shoulder hike.  Baseline: 11/23/2021 INITIAL  3 Pt will be able to perform his normal reaching and overhead activities without significant pain or difficulty.  Baseline: 12/14/2021  INITIAL  4 Pt will demo 4/5 to 4+/5 MMT strength t/o L shoulder to assist with returning to functional carrying and lifting and to assist with returning to work activities with expected limitations.  Baseline: 12/14/2020 INITIAL                               PLAN: FREQUENCY:  2 times per week  DURATION:  11 weeks   PLANNED INTERVENTIONS: Therapeutic exercises, Therapeutic activity, Neuro Muscular re-education, Patient/Family education, Joint mobilization, Electrical stimulation, Cryotherapy, Moist heat, scar mobilization, Taping, and Manual therapy   PLAN FOR NEXT SESSION: MD messaged PT to use his large RCR repair protocol.  Also consider superior capsular reconstruction protocol. Pt sees MD later this week.  Progress with AAROM per protocol at 6 weeks and cont with PROM per protocol.  Increase frequency to 2x/wk.   Ice to reduce pain and soreness.     Daleen Bo PT, DPT 10/06/21 11:09 AM

## 2021-10-08 ENCOUNTER — Encounter (HOSPITAL_BASED_OUTPATIENT_CLINIC_OR_DEPARTMENT_OTHER): Payer: Self-pay | Admitting: Physical Therapy

## 2021-10-08 ENCOUNTER — Ambulatory Visit (HOSPITAL_BASED_OUTPATIENT_CLINIC_OR_DEPARTMENT_OTHER): Payer: 59 | Admitting: Physical Therapy

## 2021-10-08 ENCOUNTER — Other Ambulatory Visit: Payer: Self-pay

## 2021-10-08 DIAGNOSIS — M25612 Stiffness of left shoulder, not elsewhere classified: Secondary | ICD-10-CM

## 2021-10-08 DIAGNOSIS — M6281 Muscle weakness (generalized): Secondary | ICD-10-CM

## 2021-10-08 DIAGNOSIS — M25512 Pain in left shoulder: Secondary | ICD-10-CM

## 2021-10-08 NOTE — Therapy (Signed)
OUTPATIENT PHYSICAL THERAPY TREATMENT  NOTE   Patient Name: Brian SAFI Sr. MRN: [id number] DOB:06/14/1966, 55 y.o., male Today's Date: 10/08/2021  PCP: Colon Branch, MD   PT End of Session - 10/08/21 [phone]     Visit Number 9    Number of Visits 32    Date for PT Re-Evaluation 11/22/21    Authorization Type [employer]    Progress Note Due on Visit --   10/30/2021   PT Start Time 0940    PT Stop Time 1022    PT Time Calculation (min) 42 min    Activity Tolerance Patient tolerated treatment well    Behavior During Therapy [facility] for tasks assessed/performed                Past Medical History:  Diagnosis Date   Asthma    Borderline diabetes    CAD (coronary artery disease)    a. s/p anterior STEMI in 09/2011 with aspiration of occluded LAD with DES placed at that time, CTO of RCA noted. EF at 30%   Chest pain    CHF (congestive heart failure) ([place])    Gout    Granulomatosis    chronic granulomatosis (dx at age 81 @ [place])   Ischemic cardiomyopathy    a. EF at 30% by cath in 09/2011. Has refused echos in the past.    MI (myocardial infarction) ([place])    Obesity    SOB (shortness of breath)    Past Surgical History:  Procedure Laterality Date   CHOLECYSTECTOMY     LEFT HEART CATHETERIZATION WITH CORONARY ANGIOGRAM N/A 10/18/2011   Procedure: LEFT HEART CATHETERIZATION WITH CORONARY ANGIOGRAM;  Surgeon: Jettie Booze, MD;  Location: [facility] CATH LAB;  Service: Cardiovascular;  Laterality: N/A;   LUNG BIOPSY  1990s   PERCUTANEOUS CORONARY STENT INTERVENTION (PCI-S) N/A 10/18/2011   Procedure: PERCUTANEOUS CORONARY STENT INTERVENTION (PCI-S);  Surgeon: Jettie Booze, MD;  Location: [facility] CATH LAB;  Service: Cardiovascular;  Laterality: N/A;   Patient Active Problem List   Diagnosis Date Noted   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    Subacromial bursitis of left shoulder joint    Left shoulder pain 07/10/2021   PCP NOTES >>>>>>>>>>>>>>>>  05/03/2017   Hemorrhoids 02/14/2012   CAD (coronary artery disease) 12/01/2011   Hyperlipidemia 12/01/2011   Ischemic cardiomyopathy 12/01/2011   Asthma 10/25/2011   Borderline diabetes 10/25/2011   Acute MI, anterior wall ([place]) 10/21/2011   Coronary atherosclerosis of native coronary artery 10/21/2011   Obesity, unspecified 10/21/2011   GOUT 02/24/2010   PCP: Colon Branch, MD   REFERRING PROVIDER: Vanetta Mulders, MD   REFERRING DIAG: [phone] (ICD-10-CM) - Left shoulder pain, unspecified chronicity; M75.102 (ICD-10-CM) - Unspecified rotator cuff tear or rupture of left shoulder   THERAPY DIAG:  Left shoulder pain, unspecified chronicity   Stiffness of left shoulder, not elsewhere classified   Muscle weakness (generalized)     ONSET DATE: 08/20/2021:  Left shoulder arthroscopy with superior capsular reconstruction, subacromial decompression with acromioplasty, and limited shoulder debridement.   SUBJECTIVE:  SUBJECTIVE STATEMENT: -Pt is 7 weeks s/p L shoulder superior capsular reconstruction and subacromial decompression with acromioplasty.  Pt denies any adverse effects after prior Rx just soreness that evening and the following day.  Pt didn't perform new HEP yesterday due to soreness.  Pt states he feels great being out of the sling. -He is limited with ADLs/IADLs and reaching activities.  Pt unable to perform overhead activities.  Pt unable to fish and hunt.  He is unable to perform his occupational activities.     PERTINENT HISTORY: L shoulder superior capsular reconstruction (08/20/2021).  MI, CAD, Cadiac surgery with stent placement in 2013, obesity   PAIN:  Are you having pain? No NPRS scale: 0/10 current Pain location: shoulder Pain orientation: Left      PRECAUTIONS: Other: Per Dr.  Eddie Dibbles large RCR protocol.  Also superior capsular reconstruction protocol.   WEIGHT BEARING RESTRICTIONS Yes NWB L UE   OBJECTIVE:   TODAY'S TREATMENT:     Therapeutic Exercise: -Reviewed response to prior Rx, current function, HEP compliance, and pain level. -Pt performed: supine wand flexion 2x10 reps Supine wand ER 2x10 reps Seated table slides in flexion 2x10 reps Pulleys in scaption -Updated HEP and gave pt a HEP handout.  Educated pt in correct form and appropriate frequency.     Manual Therapy: -pt received L shoulder gentle distraction with oscillation and grade II inf and post L GH jt mobs to improve stiffness, pain, and normalize arthrokinematics -Pt received L shoulder flexion, Abd, scaption, and ER PROM per protocol and pt tolerance.     PATIENT EDUCATION: Education details:  Educated pt with appropriate range and positioning/form with AAROM.  Demonstrated to pt and wife with proper positioning and form with supine wand ER, protocol progression.  Updated HEP and gave pt a HEP Handout.  Instructed pt to not perform into a painful or tight range. Person educated: Patient and wife Education method: Explanation, Demonstration, Verbal cues and tactile cues, handout Education comprehension: verbalized understanding, returned demonstration, and verbal cues required     HOME EXERCISE PROGRAM: Access Code: [email or web address] URL: [email or web address] Date: 10/08/2021 Prepared by: Ronny Flurry  Exercises Circular Shoulder Pendulum with Table Support - 3 x daily - 7 x weekly - 2 sets - 10 reps Flexion-Extension Shoulder Pendulum with Table Support - 3 x daily - 7 x weekly - 2 sets - 10 reps Seated Gripping Towel - 2-3 x daily - 7 x weekly - 2-3 sets - 10 rep Seated Shoulder Flexion Towel Slide at Table Top - 3 x daily - 7 x weekly - 1 sets - 10 reps - 5 hold Supine Shoulder Wand flexion AAROM - 2 x daily - 7 x weekly - 2 sets - 10 reps Supine Shoulder  External Rotation in 45 Degrees Abduction AAROM with Dowel - 2 x daily - 7 x weekly - 2 sets - 10 reps     ASSESSMENT:   CLINICAL IMPRESSION:   Pt continues to progress well with L shoulder ROM and protocol.  Pt is doing well advancing to [facility] and performed AAROM well with cuing and instruction in correct form.  Pt has stopped using sling without adverse effects.  Pt responded well to Rx having no pain after Rx.  He should cont to benefit from cont skilled PT services per protocol to address ongoing goals and to assist in restoring PLOF.       GOALS:     SHORT TERM GOALS:   STG Name Target Date Goal  status  1 Pt will be independent and compliant with HEP for improved pain, ROM, strength, and function.  Baseline:  09/14/2021 GOAL MET  2 Pt will demo improved L shoulder PROM in flexion to 90 deg and ER to 25 deg for improved ROM and stiffness.   Baseline:  09/14/2021 GOAL MET  3 Pt will demo improved L shoulder PROM in flexion to 120 deg and ER to 40 deg for improved ROM, mobility, and stiffness. Baseline: 09/28/2021 GOAL MET  4 Pt will wean out of sling without adverse effects Baseline: 10/05/2021 INITIAL  5 Pt will tolerate the initiation of AAROM without adverse effects for improved mobility and progression of protocol to improve fxn.. Baseline: 10/05/2021 INITIAL  6 Pt will demo at least 140 deg of supine flexion AAROM and 45 deg ER for improved mobility and stiffness Baseline: 10/19/2021 INITIAL  7 Pt will demo at least 120 deg of flexion and scaption AAROM in standing for improved reaching Baseline: 10/26/2021 INITIAL  8 Pt will be able to actively elevate L UE > 90 deg without significant shoulder hike or significant pain 11/09/2021 INITIAL  9 Pt will be able to perform his self care activities with no > than minimal difficulty 11/02/2021 INITIAL    LONG TERM GOALS:    LTG Name Target Date Goal status  1 Pt will be able to perform his ADLs and IADLs without significant pain or  difficulty.  12/14/2021 Baseline: 12/14/2021 INITIAL  2 Pt will be able to actively reach into an overhead cabinet without significant difficulty or shoulder hike.  Baseline: 11/23/2021 INITIAL  3 Pt will be able to perform his normal reaching and overhead activities without significant pain or difficulty.  Baseline: 12/14/2021 INITIAL  4 Pt will demo 4/5 to 4+/5 MMT strength t/o L shoulder to assist with returning to functional carrying and lifting and to assist with returning to work activities with expected limitations.  Baseline: 12/14/2020 INITIAL                               PLAN:   PLANNED INTERVENTIONS: Therapeutic exercises, Therapeutic activity, Neuro Muscular re-education, Patient/Family education, Joint mobilization, Electrical stimulation, Cryotherapy, Moist heat, scar mobilization, Taping, and Manual therapy   PLAN FOR NEXT SESSION: MD messaged PT to use his large RCR repair protocol.  Also consider superior capsular reconstruction protocol. Pt sees MD later this week.  Progress with AAROM per protocol at 6 weeks and cont with PROM per protocol.  Increase frequency to 2x/wk.   Ice to reduce pain and soreness.      Selinda Michaels III PT, DPT 10/08/21 10:52 AM

## 2021-10-15 ENCOUNTER — Ambulatory Visit (HOSPITAL_BASED_OUTPATIENT_CLINIC_OR_DEPARTMENT_OTHER): Payer: 59 | Admitting: Physical Therapy

## 2021-10-15 ENCOUNTER — Other Ambulatory Visit: Payer: Self-pay

## 2021-10-15 ENCOUNTER — Encounter (HOSPITAL_BASED_OUTPATIENT_CLINIC_OR_DEPARTMENT_OTHER): Payer: Self-pay | Admitting: Physical Therapy

## 2021-10-15 DIAGNOSIS — M25612 Stiffness of left shoulder, not elsewhere classified: Secondary | ICD-10-CM

## 2021-10-15 DIAGNOSIS — M6281 Muscle weakness (generalized): Secondary | ICD-10-CM

## 2021-10-15 DIAGNOSIS — M25512 Pain in left shoulder: Secondary | ICD-10-CM

## 2021-10-15 NOTE — Therapy (Addendum)
OUTPATIENT PHYSICAL THERAPY TREATMENT  NOTE   Patient Name: Brian CORLEW Sr. MRN: [id number] DOB:[birth year], 55 y.o., male Today's Date: 10/16/2021  PCP: Colon Branch, MD   PT End of Session - 10/15/21 0900     Visit Number 10    Number of Visits 32    Authorization Type [employer]    Progress Note Due on Visit --   10/30/2021   PT Start Time 0805    PT Stop Time 1324    PT Time Calculation (min) 42 min    Activity Tolerance Patient tolerated treatment well    Behavior During Therapy [facility] for tasks assessed/performed                 Past Medical History:  Diagnosis Date   Asthma    Borderline diabetes    CAD (coronary artery disease)    a. s/p anterior STEMI in 09/2011 with aspiration of occluded LAD with DES placed at that time, CTO of RCA noted. EF at 30%   Chest pain    CHF (congestive heart failure) ([place])    Gout    Granulomatosis    chronic granulomatosis (dx at age 82 @ [place])   Ischemic cardiomyopathy    a. EF at 30% by cath in 09/2011. Has refused echos in the past.    MI (myocardial infarction) ([place])    Obesity    SOB (shortness of breath)    Past Surgical History:  Procedure Laterality Date   CHOLECYSTECTOMY     LEFT HEART CATHETERIZATION WITH CORONARY ANGIOGRAM N/A 10/18/2011   Procedure: LEFT HEART CATHETERIZATION WITH CORONARY ANGIOGRAM;  Surgeon: Jettie Booze, MD;  Location: [facility] CATH LAB;  Service: Cardiovascular;  Laterality: N/A;   LUNG BIOPSY  1990s   PERCUTANEOUS CORONARY STENT INTERVENTION (PCI-S) N/A 10/18/2011   Procedure: PERCUTANEOUS CORONARY STENT INTERVENTION (PCI-S);  Surgeon: Jettie Booze, MD;  Location: [facility] CATH LAB;  Service: Cardiovascular;  Laterality: N/A;   Patient Active Problem List   Diagnosis Date Noted   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    Subacromial bursitis of left shoulder joint    Left shoulder pain 07/10/2021   PCP NOTES >>>>>>>>>>>>>>>> 05/03/2017   Hemorrhoids 02/14/2012    CAD (coronary artery disease) 12/01/2011   Hyperlipidemia 12/01/2011   Ischemic cardiomyopathy 12/01/2011   Asthma 10/25/2011   Borderline diabetes 10/25/2011   Acute MI, anterior wall ([place]) 10/21/2011   Coronary atherosclerosis of native coronary artery 10/21/2011   Obesity, unspecified 10/21/2011   GOUT 02/24/2010   PCP: Colon Branch, MD   REFERRING PROVIDER: Vanetta Mulders, MD   REFERRING DIAG: [phone] (ICD-10-CM) - Left shoulder pain, unspecified chronicity; M75.102 (ICD-10-CM) - Unspecified rotator cuff tear or rupture of left shoulder   THERAPY DIAG:  Left shoulder pain, unspecified chronicity   Stiffness of left shoulder, not elsewhere classified   Muscle weakness (generalized)     ONSET DATE: 08/20/2021:  Left shoulder arthroscopy with superior capsular reconstruction, subacromial decompression with acromioplasty, and limited shoulder debridement.   SUBJECTIVE:  SUBJECTIVE STATEMENT: -Pt is 8 weeks s/p L shoulder superior capsular reconstruction and subacromial decompression with acromioplasty.  Pt denies any adverse effects after prior Rx just soreness.  Pt states he does some of his exercises but not as much as he should.  Pt states he is improving and has improved shoulder mobility.  Pt reports improved reaching and is able to use his L UE more with daily activities.  Pt unable to perform overhead activities.  Pt unable to fish and hunt.  He is unable to perform his occupational activities.     PERTINENT HISTORY: L shoulder superior capsular reconstruction (08/20/2021).  MI, CAD, Cadiac surgery with stent placement in 2013, obesity   PAIN:  Are you having pain? No NPRS scale: 0/10 current Pain location: shoulder Pain orientation: Left      PRECAUTIONS: Other: Per Dr. Eddie Dibbles large  RCR protocol.  Also superior capsular reconstruction protocol.   WEIGHT BEARING RESTRICTIONS Yes NWB L UE   OBJECTIVE:   TODAY'S TREATMENT:     Therapeutic Exercise: -Reviewed response to prior Rx, current function, HEP compliance, and pain level. -Pt performed: supine wand flexion 2x10 reps Supine wand ER 2x10 reps Seated wand ER 2x10 reps Supine flexion AROM 2x10 reps Prone shoulder extension to neutral and bent over extension to neutral x10 reps each  -Updated HEP and gave pt a HEP handout.  Educated pt in correct form and appropriate frequency.     Manual Therapy: -pt received L shoulder gentle distraction with oscillation and grade II inf and post L GH jt mobs to improve stiffness, pain, and normalize arthrokinematics -Pt received L shoulder flexion, Abd, scaption, ER, and IR PROM per protocol and pt tolerance.     PATIENT EDUCATION: Education details:  Educated pt with appropriate range and positioning/form with AAROM.  Demonstrated to pt and wife with proper positioning and form with supine wand ER, protocol progression.  Updated HEP and gave pt a HEP Handout.  Instructed pt to not perform into a painful or tight range. Person educated: Patient and wife Education method: Explanation, Demonstration, Verbal cues and tactile cues, handout Education comprehension: verbalized understanding, returned demonstration, and verbal cues required     HOME EXERCISE PROGRAM: Access Code: [email or web address] URL: [email or web address] Date: 10/08/2021 Prepared by: Ronny Flurry  Exercises Circular Shoulder Pendulum with Table Support - 3 x daily - 7 x weekly - 2 sets - 10 reps Flexion-Extension Shoulder Pendulum with Table Support - 3 x daily - 7 x weekly - 2 sets - 10 reps Seated Gripping Towel - 2-3 x daily - 7 x weekly - 2-3 sets - 10 rep Seated Shoulder Flexion Towel Slide at Table Top - 3 x daily - 7 x weekly - 1 sets - 10 reps - 5 hold Supine Shoulder Wand flexion  AAROM - 2 x daily - 7 x weekly - 2 sets - 10 reps Supine Shoulder External Rotation in 45 Degrees Abduction AAROM with Dowel - 2 x daily - 7 x weekly - 2 sets - 10 reps Supine Shoulder Flexion AROM - 2 x daily - 7 x weekly - 1-2 sets - 10 reps Seated Shoulder External Rotation AAROM with Dowel - 2 x daily - 7 x weekly - 2 sets - 10 reps     ASSESSMENT:   CLINICAL IMPRESSION:   Pt continues to progress make very good progress with L shoulder ROM.  Pt demonstrates good L shoulder flexion AROM in supine.  Pt had reduced tightness in L shoulder  PROM.  He reports improved functional usage of L UE and is able to reach higher.  Pt is progressing appropriately with protocol and performed ther ex well without c/o's.  Pt responded well to Rx having no pain after Rx.  He should cont to benefit from cont skilled PT services per protocol to address ongoing goals and to assist in restoring PLOF.       GOALS:     SHORT TERM GOALS:   STG Name Target Date Goal status  1 Pt will be independent and compliant with HEP for improved pain, ROM, strength, and function.  Baseline:  09/14/2021 GOAL MET  2 Pt will demo improved L shoulder PROM in flexion to 90 deg and ER to 25 deg for improved ROM and stiffness.   Baseline:  09/14/2021 GOAL MET  3 Pt will demo improved L shoulder PROM in flexion to 120 deg and ER to 40 deg for improved ROM, mobility, and stiffness. Baseline: 09/28/2021 GOAL MET  4 Pt will wean out of sling without adverse effects Baseline: 10/05/2021 INITIAL  5 Pt will tolerate the initiation of AAROM without adverse effects for improved mobility and progression of protocol to improve fxn.. Baseline: 10/05/2021 INITIAL  6 Pt will demo at least 140 deg of supine flexion AAROM and 45 deg ER for improved mobility and stiffness Baseline: 10/19/2021 INITIAL  7 Pt will demo at least 120 deg of flexion and scaption AAROM in standing for improved reaching Baseline: 10/26/2021 INITIAL  8 Pt will be able to  actively elevate L UE > 90 deg without significant shoulder hike or significant pain 11/09/2021 INITIAL  9 Pt will be able to perform his self care activities with no > than minimal difficulty 11/02/2021 INITIAL    LONG TERM GOALS:    LTG Name Target Date Goal status  1 Pt will be able to perform his ADLs and IADLs without significant pain or difficulty.  12/14/2021 Baseline: 12/14/2021 INITIAL  2 Pt will be able to actively reach into an overhead cabinet without significant difficulty or shoulder hike.  Baseline: 11/23/2021 INITIAL  3 Pt will be able to perform his normal reaching and overhead activities without significant pain or difficulty.  Baseline: 12/14/2021 INITIAL  4 Pt will demo 4/5 to 4+/5 MMT strength t/o L shoulder to assist with returning to functional carrying and lifting and to assist with returning to work activities with expected limitations.  Baseline: 12/14/2020 INITIAL                               PLAN:   PLANNED INTERVENTIONS: Therapeutic exercises, Therapeutic activity, Neuro Muscular re-education, Patient/Family education, Joint mobilization, Electrical stimulation, Cryotherapy, Moist heat, scar mobilization, Taping, and Manual therapy   PLAN FOR NEXT SESSION: MD messaged PT to use his large RCR repair protocol.  Also consider superior capsular reconstruction protocol. Pt sees MD later this week.  Progress with AAROM per protocol at 6 weeks and cont with PROM per protocol.  Increase frequency to 2x/wk.   Ice to reduce pain and soreness.      Selinda Michaels III PT, DPT 10/16/21 7:12 AM

## 2021-10-20 ENCOUNTER — Other Ambulatory Visit: Payer: Self-pay

## 2021-10-20 ENCOUNTER — Encounter (HOSPITAL_BASED_OUTPATIENT_CLINIC_OR_DEPARTMENT_OTHER): Payer: Self-pay | Admitting: Physical Therapy

## 2021-10-20 ENCOUNTER — Ambulatory Visit (HOSPITAL_BASED_OUTPATIENT_CLINIC_OR_DEPARTMENT_OTHER): Payer: Managed Care, Other (non HMO) | Attending: Orthopaedic Surgery | Admitting: Physical Therapy

## 2021-10-20 DIAGNOSIS — M25612 Stiffness of left shoulder, not elsewhere classified: Secondary | ICD-10-CM | POA: Insufficient documentation

## 2021-10-20 DIAGNOSIS — M25512 Pain in left shoulder: Secondary | ICD-10-CM | POA: Insufficient documentation

## 2021-10-20 DIAGNOSIS — M6281 Muscle weakness (generalized): Secondary | ICD-10-CM | POA: Insufficient documentation

## 2021-10-20 NOTE — Therapy (Signed)
OUTPATIENT PHYSICAL THERAPY TREATMENT  NOTE   Patient Name: Brian BALKE Sr. MRN: [id number] DOB:[birth year], 56 y.o., male Today's Date: 10/20/2021  PCP: Colon Branch, MD   PT End of Session - 10/20/21 1244     Visit Number 11    Number of Visits 32    Date for PT Re-Evaluation 11/22/21    Authorization Type [employer]    Progress Note Due on Visit --   10/30/2021   PT Start Time 1150    PT Stop Time 3500    PT Time Calculation (min) 45 min    Activity Tolerance Patient tolerated treatment well    Behavior During Therapy [facility] for tasks assessed/performed                  Past Medical History:  Diagnosis Date   Asthma    Borderline diabetes    CAD (coronary artery disease)    a. s/p anterior STEMI in 09/2011 with aspiration of occluded LAD with DES placed at that time, CTO of RCA noted. 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 EF at 30% by cath in 09/2011. Has refused echos in the past.    MI (myocardial infarction) ([place])    Obesity    SOB (shortness of breath)    Past Surgical History:  Procedure Laterality Date   CHOLECYSTECTOMY     LEFT HEART CATHETERIZATION WITH CORONARY ANGIOGRAM N/A 10/18/2011   Procedure: LEFT HEART CATHETERIZATION WITH CORONARY ANGIOGRAM;  Surgeon: Jettie Booze, MD;  Location[facility] C[facility] LAB;  Service: Cardiovascular;  Laterality: N/A;   LUNG BIOPSY  1990s   PERCUTANEOUS CORONARY STENT INTERVENTION (PCI-S) N/A 10/18/2011   Procedure: PERCUTANEOUS CORONARY STENT INTERVENTION (PCI-S);  Surgeon: Jettie Booze, MD;  Location: [facility] CATH LAB;  Service: Cardiovascular;  Laterality: N/A;   Patient Active Problem List   Diagnosis Date Noted   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    Subacromial bursitis of left shoulder joint    Left shoulder pain 07/10/2021   PCP NOTES >>>>>>>>>>>>>>>>  05/03/2017   Hemorrhoids 02/14/2012   CAD (coronary artery disease) 12/01/2011   Hyperlipidemia 12/01/2011   Ischemic cardiomyopathy 12/01/2011   Asthma 10/25/2011   Borderline diabetes 10/25/2011   Acute MI, anterior wall ([place]) 10/21/2011   Coronary atherosclerosis of native coronary artery 10/21/2011   Obesity, unspecified 10/21/2011   GOUT 02/24/2010   PCP: Colon Branch, MD   REFERRING PROVIDER: Vanetta Mulders, MD   REFERRING DIAG: [phone] (ICD-10-CM) - Left shoulder pain, unspecified chronicity; M75.102 (ICD-10-CM) - Unspecified rotator cuff tear or rupture of left shoulder   THERAPY DIAG:  Left shoulder pain, unspecified chronicity   Stiffness of left shoulder, not elsewhere classified   Muscle weakness (generalized)     ONSET DATE: 08/20/2021:  Left shoulder arthroscopy with superior capsular reconstruction, subacromial decompression with acromioplasty, and limited shoulder debridement.   SUBJECTIVE:  SUBJECTIVE STATEMENT: -Pt is 8 weeks and 5 days s/p L shoulder superior capsular reconstruction and subacromial decompression with acromioplasty.  Pt denies any adverse effects after prior Rx just soreness.  Pt states he may be overdoing it such as elevating/lifting his arm.  Pt states he has been sore and thinks he is working it too much.  Pt reports he had a quick stretch when his arms were on the truck and he stepped down.  He had increased soreness when trying to lower his L UE independently when he had it on the glass in the shower.  Pt denies any soreness at rest though has soreness with reaching.  Pt states he did lift some deer meat when cooking.  Pt states he does some of his exercises but not as much as he should.  Pt states he is improving and has improved shoulder mobility.  Pt reports improved  reaching and is able to use his L UE more with daily activities.  Pt unable to perform overhead activities.  Pt unable to fish and hunt.  He is unable to perform his occupational activities.     PERTINENT HISTORY: L shoulder superior capsular reconstruction (08/20/2021).  MI, CAD, Cadiac surgery with stent placement in 2013, obesity   PAIN:  Are you having pain? No NPRS scale: 0/10 current Pain location: shoulder Pain orientation: Left      PRECAUTIONS: Other: Per Dr. Eddie Dibbles large RCR protocol.  Also superior capsular reconstruction protocol.      OBJECTIVE:   TODAY'S TREATMENT:     Therapeutic Exercise: -Reviewed response to prior Rx, current function, HEP compliance, and pain level. -Pt performed: Supine wand ER x 15 reps Seated wand ER x 15 reps Supine flexion AROM 2x10 reps Supine serratus punches 2x10 reps Prone shoulder extension to neutral x 15 reps and x10 reps Prone row 2x10 reps Shoulder submaximal isometrics with 5-6 sec hold x 6-7 reps in flex, abd, and ext    Manual Therapy: -pt received L shoulder gentle distraction with oscillation and grade II inf and post L GH jt mobs to improve stiffness, pain, and normalize arthrokinematics -Pt received L shoulder flexion, Abd, scaption, ER, and IR PROM per protocol and pt tolerance.     PATIENT EDUCATION: Education details:  POC, exercise form and HEP.  Pt instructed to not lift objects including deer meat.  Person educated: Patient Education method: Explanation, Demonstration, Verbal cues and tactile cues, handout Education comprehension: verbalized understanding, returned demonstration, and verbal cues required     HOME EXERCISE PROGRAM: Access Code: [email or web address] URL: [email or web address] Date: 10/08/2021 Prepared by: Ronny Flurry  Exercises Circular Shoulder Pendulum with Table Support - 3 x daily - 7 x weekly - 2 sets - 10 reps Flexion-Extension Shoulder Pendulum with Table Support - 3 x  daily - 7 x weekly - 2 sets - 10 reps Seated Gripping Towel - 2-3 x daily - 7 x weekly - 2-3 sets - 10 rep Seated Shoulder Flexion Towel Slide at Table Top - 3 x daily - 7 x weekly - 1 sets - 10 reps - 5 hold Supine Shoulder Wand flexion AAROM - 2 x daily - 7 x weekly - 2 sets - 10 reps Supine Shoulder External Rotation in 45 Degrees Abduction AAROM with Dowel - 2 x daily - 7 x weekly - 2 sets - 10 reps Supine Shoulder Flexion AROM - 2 x daily - 7 x weekly - 1-2 sets - 10 reps Seated Shoulder External Rotation AAROM with  Dowel - 2 x daily - 7 x weekly - 2 sets - 10 reps     ASSESSMENT:   CLINICAL IMPRESSION:   Pt continues to progress make very good progress with L shoulder ROM.  He has reduced tightness in L shoulder.  Pt reports having soreness though is using his L UE more with mobility and daily activities.  PT progressed exercises per protocol today with AROM and submaximal isometrics and he performed exercises well with cuing and instruction in correct form.   Pt is progressing appropriately with protocol.  Pt responded well to Rx having no pain after Rx.  He should cont to benefit from cont skilled PT services per protocol to address ongoing goals and to assist in restoring PLOF.       GOALS:     SHORT TERM GOALS:   STG Name Target Date Goal status  1 Pt will be independent and compliant with HEP for improved pain, ROM, strength, and function.  Baseline:  09/14/2021 GOAL MET  2 Pt will demo improved L shoulder PROM in flexion to 90 deg and ER to 25 deg for improved ROM and stiffness.   Baseline:  09/14/2021 GOAL MET  3 Pt will demo improved L shoulder PROM in flexion to 120 deg and ER to 40 deg for improved ROM, mobility, and stiffness. Baseline: 09/28/2021 GOAL MET  4 Pt will wean out of sling without adverse effects Baseline: 10/05/2021 INITIAL  5 Pt will tolerate the initiation of AAROM without adverse effects for improved mobility and progression of protocol to improve  fxn.. Baseline: 10/05/2021 INITIAL  6 Pt will demo at least 140 deg of supine flexion AAROM and 45 deg ER for improved mobility and stiffness Baseline: 10/19/2021 INITIAL  7 Pt will demo at least 120 deg of flexion and scaption AAROM in standing for improved reaching Baseline: 10/26/2021 INITIAL  8 Pt will be able to actively elevate L UE > 90 deg without significant shoulder hike or significant pain 11/09/2021 INITIAL  9 Pt will be able to perform his self care activities with no > than minimal difficulty 11/02/2021 INITIAL    LONG TERM GOALS:    LTG Name Target Date Goal status  1 Pt will be able to perform his ADLs and IADLs without significant pain or difficulty.  12/14/2021 Baseline: 12/14/2021 INITIAL  2 Pt will be able to actively reach into an overhead cabinet without significant difficulty or shoulder hike.  Baseline: 11/23/2021 INITIAL  3 Pt will be able to perform his normal reaching and overhead activities without significant pain or difficulty.  Baseline: 12/14/2021 INITIAL  4 Pt will demo 4/5 to 4+/5 MMT strength t/o L shoulder to assist with returning to functional carrying and lifting and to assist with returning to work activities with expected limitations.  Baseline: 12/14/2020 INITIAL                               PLAN:   PLANNED INTERVENTIONS: Therapeutic exercises, Therapeutic activity, Neuro Muscular re-education, Patient/Family education, Joint mobilization, Electrical stimulation, Cryotherapy, Moist heat, scar mobilization, Taping, and Manual therapy   PLAN FOR NEXT SESSION: MD messaged PT to use his large RCR repair protocol.  Cont with Dr. Eddie Dibbles RCR protocol and consider superior capsular reconstruction protocol.  Update HEP next visit and attempt standing AAROM.  Increase frequency to 2x/wk.   Ice to reduce pain and soreness.        Gurnie Duris  Aline Brochure III PT, DPT 10/20/21 1:50 PM

## 2021-10-22 ENCOUNTER — Encounter (HOSPITAL_BASED_OUTPATIENT_CLINIC_OR_DEPARTMENT_OTHER): Payer: Self-pay | Admitting: Physical Therapy

## 2021-10-22 ENCOUNTER — Other Ambulatory Visit: Payer: Self-pay

## 2021-10-22 ENCOUNTER — Ambulatory Visit (HOSPITAL_BASED_OUTPATIENT_CLINIC_OR_DEPARTMENT_OTHER): Payer: Managed Care, Other (non HMO) | Admitting: Physical Therapy

## 2021-10-22 DIAGNOSIS — M6281 Muscle weakness (generalized): Secondary | ICD-10-CM

## 2021-10-22 DIAGNOSIS — M25512 Pain in left shoulder: Secondary | ICD-10-CM | POA: Diagnosis not present

## 2021-10-22 DIAGNOSIS — M25612 Stiffness of left shoulder, not elsewhere classified: Secondary | ICD-10-CM

## 2021-10-22 NOTE — Therapy (Signed)
OUTPATIENT PHYSICAL THERAPY TREATMENT  NOTE   Patient Name: Brian DISCHER Sr. MRN: [id number] DOB:01-04-1966, 56 y.o., male Today's Date: 10/22/2021  PCP: Colon Branch, MD   PT End of Session - 10/22/21 0857     Visit Number 12    Number of Visits 32    Date for PT Re-Evaluation 11/22/21    Authorization Type [employer]    PT Start Time [phone]    PT Stop Time 0935    PT Time Calculation (min) 43 min    Activity Tolerance Patient tolerated treatment well    Behavior During Therapy [facility] for tasks assessed/performed                  Past Medical History:  Diagnosis Date   Asthma    Borderline diabetes    CAD (coronary artery disease)    a. s/p anterior STEMI in 09/2011 with aspiration of occluded LAD with DES placed at that time, CTO of RCA noted. EF at 30%   Chest pain    CHF (congestive heart failure) ([place])    Gout    Granulomatosis    chronic granulomatosis (dx at age 82 @ [place])   Ischemic cardiomyopathy    a. EF at 30% by cath in 09/2011. Has refused echos in the past.    MI (myocardial infarction) ([place])    Obesity    SOB (shortness of breath)    Past Surgical History:  Procedure Laterality Date   CHOLECYSTECTOMY     LEFT HEART CATHETERIZATION WITH CORONARY ANGIOGRAM N/A 10/18/2011   Procedure: LEFT HEART CATHETERIZATION WITH CORONARY ANGIOGRAM;  Surgeon: Jettie Booze, MD;  Location: [facility] CATH LAB;  Service: Cardiovascular;  Laterality: N/A;   LUNG BIOPSY  1990s   PERCUTANEOUS CORONARY STENT INTERVENTION (PCI-S) N/A 10/18/2011   Procedure: PERCUTANEOUS CORONARY STENT INTERVENTION (PCI-S);  Surgeon: Jettie Booze, MD;  Location: [facility] CATH LAB;  Service: Cardiovascular;  Laterality: N/A;   Patient Active Problem List   Diagnosis Date Noted   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    Subacromial bursitis of left shoulder joint    Left shoulder pain 07/10/2021   PCP NOTES >>>>>>>>>>>>>>>> 05/03/2017   Hemorrhoids 02/14/2012   CAD  (coronary artery disease) 12/01/2011   Hyperlipidemia 12/01/2011   Ischemic cardiomyopathy 12/01/2011   Asthma 10/25/2011   Borderline diabetes 10/25/2011   Acute MI, anterior wall ([place]) 10/21/2011   Coronary atherosclerosis of native coronary artery 10/21/2011   Obesity, unspecified 10/21/2011   GOUT 02/24/2010   PCP: Colon Branch, MD   REFERRING PROVIDER: Vanetta Mulders, MD   REFERRING DIAG: [phone] (ICD-10-CM) - Left shoulder pain, unspecified chronicity; M75.102 (ICD-10-CM) - Unspecified rotator cuff tear or rupture of left shoulder   THERAPY DIAG:  Left shoulder pain, unspecified chronicity   Stiffness of left shoulder, not elsewhere classified   Muscle weakness (generalized)     ONSET DATE: 08/20/2021:  Left shoulder arthroscopy with superior capsular reconstruction, subacromial decompression with acromioplasty, and limited shoulder debridement.   SUBJECTIVE:  SUBJECTIVE STATEMENT: -Pt is 9 weeks s/p L shoulder superior capsular reconstruction and subacromial decompression with acromioplasty.  Pt states he felt good after prior Rx and had no pain.  Pt states he is improving and has improved shoulder mobility.  Pt reports improved reaching and is able to use his L UE more with daily activities.  Pt unable to perform overhead activities.  Pt unable to fish and hunt.  He is unable to perform his occupational activities.     PERTINENT HISTORY: L shoulder superior capsular reconstruction (08/20/2021).  MI, CAD, Cadiac surgery with stent placement in 2013, obesity   PAIN:  Are you having pain? No NPRS scale: 0/10 current Pain location: shoulder Pain orientation: Left      PRECAUTIONS: Other: Per Dr. Eddie Dibbles large RCR protocol.  Also superior capsular reconstruction protocol.       OBJECTIVE:   TODAY'S TREATMENT:     Therapeutic Exercise: -Reviewed response to prior Rx, current function, HEP compliance, and pain level. -Pt performed: Seated wand ER 2 x 12 reps Supine flexion AROM 2x10 reps Supine serratus punches 2x10 reps Supine shoulder ABC x 1 rep Prone shoulder extension to neutral 2x10 reps Shoulder submaximal isometrics with 5-6 sec hold x 7 reps in flex, abd, ext, ER, and IR  -Updated HEP and gave pt a HEP handout.  Educated pt in correct form and appropriate frequency.  Educated pt he should not have increased pain with HEP.     Manual Therapy: -pt received L shoulder gentle distraction with oscillation and grade II inf and post L GH jt mobs to improve stiffness, pain, and normalize arthrokinematics -Pt received L shoulder flexion, Abd, scaption, ER, and IR PROM per protocol and pt tolerance.     PATIENT EDUCATION: Education details:  Updated HEP and gave pt a HEP handout.  Educated pt in correct form and appropriate frequency.  Educated pt he should not have increased pain with HEP.  POC and exercise form.  Instructed pt in pushing gently with submax isometrics and informed pt those ex's should be pain-free.    Person educated: Patient Education method: Explanation, Demonstration, Verbal cues and tactile cues, handout Education comprehension: verbalized understanding, returned demonstration, and verbal cues required     HOME EXERCISE PROGRAM: Access Code: [email or web address] URL: [email or web address] Date: 10/22/2021 Prepared by: Ronny Flurry  Exercises Circular Shoulder Pendulum with Table Support - 3 x daily - 7 x weekly - 2 sets - 10 reps Flexion-Extension Shoulder Pendulum with Table Support - 3 x daily - 7 x weekly - 2 sets - 10 reps Seated Gripping Towel - 2-3 x daily - 7 x weekly - 2-3 sets - 10 reps Seated Shoulder Abduction Towel Slide at Table Top - 3 x daily - 7 x weekly - 1 sets - 10 reps - 5 hold Seated Shoulder  Flexion Towel Slide at Table Top - 3 x daily - 7 x weekly - 1 sets - 10 reps - 5 hold Seated Shoulder External Rotation PROM on Table - 3 x daily - 7 x weekly - 1 sets - 10 reps - 5 hold Supine Shoulder Wand flexion AAROM - 2 x daily - 7 x weekly - 2 sets - 10 reps Supine Shoulder External Rotation in 45 Degrees Abduction AAROM with Dowel - 2 x daily - 7 x weekly - 2 sets - 10 reps Supine Shoulder Flexion AROM - 2 x daily - 7 x weekly - 1-2 sets - 10 reps Seated Shoulder External Rotation AAROM  with Dowel - 2 x daily - 7 x weekly - 2 sets - 10 reps Single Arm Serratus Punches - 1 x daily - 5-7 x weekly - 2 sets - 10 reps Supine Shoulder Alphabet - 1 x daily - 7 x weekly - 1 reps Prone Shoulder Extension - Single Arm - 1 x daily - 7 x weekly - 2 sets - 10 reps      ASSESSMENT:   CLINICAL IMPRESSION:   Pt continues to progress make very good progress with L shoulder ROM.  He has reduced tightness in L shoulder.  PT progressed exercises per protocol today with AROM and submaximal isometrics and he performed exercises well with cuing and instruction in correct form.  Pt was fatigued with ER submax isometric.  Pt is progressing appropriately with protocol.  Pt responded well to Rx having no pain after Rx.  He should cont to benefit from cont skilled PT services per protocol to address ongoing goals and to assist in restoring PLOF.       GOALS:     SHORT TERM GOALS:   STG Name Target Date Goal status  1 Pt will be independent and compliant with HEP for improved pain, ROM, strength, and function.  Baseline:  09/14/2021 GOAL MET  2 Pt will demo improved L shoulder PROM in flexion to 90 deg and ER to 25 deg for improved ROM and stiffness.   Baseline:  09/14/2021 GOAL MET  3 Pt will demo improved L shoulder PROM in flexion to 120 deg and ER to 40 deg for improved ROM, mobility, and stiffness. Baseline: 09/28/2021 GOAL MET  4 Pt will wean out of sling without adverse effects Baseline: 10/05/2021  INITIAL  5 Pt will tolerate the initiation of AAROM without adverse effects for improved mobility and progression of protocol to improve fxn.. Baseline: 10/05/2021 INITIAL  6 Pt will demo at least 140 deg of supine flexion AAROM and 45 deg ER for improved mobility and stiffness Baseline: 10/19/2021 INITIAL  7 Pt will demo at least 120 deg of flexion and scaption AAROM in standing for improved reaching Baseline: 10/26/2021 INITIAL  8 Pt will be able to actively elevate L UE > 90 deg without significant shoulder hike or significant pain 11/09/2021 INITIAL  9 Pt will be able to perform his self care activities with no > than minimal difficulty 11/02/2021 INITIAL    LONG TERM GOALS:    LTG Name Target Date Goal status  1 Pt will be able to perform his ADLs and IADLs without significant pain or difficulty.  12/14/2021 Baseline: 12/14/2021 INITIAL  2 Pt will be able to actively reach into an overhead cabinet without significant difficulty or shoulder hike.  Baseline: 11/23/2021 INITIAL  3 Pt will be able to perform his normal reaching and overhead activities without significant pain or difficulty.  Baseline: 12/14/2021 INITIAL  4 Pt will demo 4/5 to 4+/5 MMT strength t/o L shoulder to assist with returning to functional carrying and lifting and to assist with returning to work activities with expected limitations.  Baseline: 12/14/2020 INITIAL                               PLAN:   PLANNED INTERVENTIONS: Therapeutic exercises, Therapeutic activity, Neuro Muscular re-education, Patient/Family education, Joint mobilization, Electrical stimulation, Cryotherapy, Moist heat, scar mobilization, Taping, and Manual therapy   PLAN FOR NEXT SESSION: MD messaged PT to use his large RCR repair  protocol.  Cont with Dr. Eddie Dibbles RCR protocol and consider superior capsular reconstruction protocol.  Update HEP next visit and attempt standing AAROM.  Increase frequency to 2x/wk.   Ice to reduce pain and soreness.         Selinda Michaels III PT, DPT 10/22/21 12:46 PM

## 2021-10-27 ENCOUNTER — Ambulatory Visit (HOSPITAL_BASED_OUTPATIENT_CLINIC_OR_DEPARTMENT_OTHER): Payer: Managed Care, Other (non HMO) | Admitting: Physical Therapy

## 2021-10-27 ENCOUNTER — Encounter (HOSPITAL_BASED_OUTPATIENT_CLINIC_OR_DEPARTMENT_OTHER): Payer: Self-pay | Admitting: Physical Therapy

## 2021-10-27 ENCOUNTER — Other Ambulatory Visit: Payer: Self-pay

## 2021-10-27 DIAGNOSIS — M25512 Pain in left shoulder: Secondary | ICD-10-CM

## 2021-10-27 DIAGNOSIS — M6281 Muscle weakness (generalized): Secondary | ICD-10-CM

## 2021-10-27 DIAGNOSIS — M25612 Stiffness of left shoulder, not elsewhere classified: Secondary | ICD-10-CM

## 2021-10-27 NOTE — Therapy (Signed)
OUTPATIENT PHYSICAL THERAPY TREATMENT  NOTE   Patient Name: Brian ABERNETHY Sr. MRN: [id number] DOB:1966/06/28, 56 y.o., male Today's Date: 10/27/2021  PCP: Colon Branch, MD   PT End of Session - 10/27/21 [id number]     Visit Number 13    Number of Visits 32    Date for PT Re-Evaluation 11/22/21    Authorization Type [employer]    Progress Note Due on Visit --   10/30/2021   PT Start Time 0845    PT Stop Time 0930    PT Time Calculation (min) 45 min    Activity Tolerance Patient tolerated treatment well    Behavior During Therapy [facility] for tasks assessed/performed                   Past Medical History:  Diagnosis Date   Asthma    Borderline diabetes    CAD (coronary artery disease)    a. s/p anterior STEMI in 09/2011 with aspiration of occluded LAD with DES placed at that time, CTO of RCA noted. EF at 30%   Chest pain    CHF (congestive heart failure) ([place])    Gout    Granulomatosis    chronic granulomatosis (dx at age 71 @ [place])   Ischemic cardiomyopathy    a. EF at 30% by cath in 09/2011. Has refused echos in the past.    MI (myocardial infarction) ([place])    Obesity    SOB (shortness of breath)    Past Surgical History:  Procedure Laterality Date   CHOLECYSTECTOMY     LEFT HEART CATHETERIZATION WITH CORONARY ANGIOGRAM N/A 10/18/2011   Procedure: LEFT HEART CATHETERIZATION WITH CORONARY ANGIOGRAM;  Surgeon: Jettie Booze, MD;  Location: [facility] CATH LAB;  Service: Cardiovascular;  Laterality: N/A;   LUNG BIOPSY  1990s   PERCUTANEOUS CORONARY STENT INTERVENTION (PCI-S) N/A 10/18/2011   Procedure: PERCUTANEOUS CORONARY STENT INTERVENTION (PCI-S);  Surgeon: Jettie Booze, MD;  Location: [facility] CATH LAB;  Service: Cardiovascular;  Laterality: N/A;   Patient Active Problem List   Diagnosis Date Noted   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    Subacromial bursitis of left shoulder joint    Left shoulder pain 07/10/2021   PCP NOTES  >>>>>>>>>>>>>>>> 05/03/2017   Hemorrhoids 02/14/2012   CAD (coronary artery disease) 12/01/2011   Hyperlipidemia 12/01/2011   Ischemic cardiomyopathy 12/01/2011   Asthma 10/25/2011   Borderline diabetes 10/25/2011   Acute MI, anterior wall ([place]) 10/21/2011   Coronary atherosclerosis of native coronary artery 10/21/2011   Obesity, unspecified 10/21/2011   GOUT 02/24/2010   PCP: Colon Branch, MD   REFERRING PROVIDER: Vanetta Mulders, MD   REFERRING DIAG: [phone] (ICD-10-CM) - Left shoulder pain, unspecified chronicity; M75.102 (ICD-10-CM) - Unspecified rotator cuff tear or rupture of left shoulder   THERAPY DIAG:  Left shoulder pain, unspecified chronicity   Stiffness of left shoulder, not elsewhere classified   Muscle weakness (generalized)     ONSET DATE: 08/20/2021:  Left shoulder arthroscopy with superior capsular reconstruction, subacromial decompression with acromioplasty, and limited shoulder debridement.   SUBJECTIVE:  SUBJECTIVE STATEMENT: -Pt is 9 weeks and 5 days s/p L shoulder superior capsular reconstruction and subacromial decompression with acromioplasty.  Pt states he felt good after prior Rx and had no pain.  Pt has performed some of his HEP.  Pt states he tweaked his shoulder yesterday.  He was crawling in his father's crawl space yesterday and had some pain though it went away.  Pt states he is improving and has improved shoulder mobility.  Pt reports improved reaching and is able to use his L UE more with daily activities.  Pt is able to reach top of his head and wash his hair.  Pt unable to perform overhead activities.  Pt unable to fish and hunt.  He is unable to perform his occupational activities.     PERTINENT HISTORY: L shoulder superior capsular reconstruction (08/20/2021).  MI,  CAD, Cadiac surgery with stent placement in 2013, obesity   PAIN:  Are you having pain? No NPRS scale: 0/10 current Pain location: shoulder Pain orientation: Left      PRECAUTIONS: Other: Per Dr. Eddie Dibbles large RCR protocol.  Also superior capsular reconstruction protocol.      OBJECTIVE:   TODAY'S TREATMENT:     Therapeutic Exercise: -Reviewed response to prior Rx, current function, HEP compliance, and pain level. -Pt performed: Supine flexion AROM 2x10 reps with head elevated Supine ER AROM 2x10 reps Supine serratus punches 2x10 reps Supine shoulder ABC x 1 rep S/L ER 2x10 reps  Prone shoulder extension to neutral 2x10 reps Standing wall walks in flexion x10 reps and x 5 reps   -Updated HEP and gave pt a HEP handout.  Educated pt in correct form and appropriate frequency.  Educated pt he should not have increased pain with HEP.     Manual Therapy: -pt received L shoulder gentle distraction with oscillation and grade II inf and post L GH jt mobs to improve stiffness, pain, and normalize arthrokinematics -Pt received L shoulder flexion, Abd, scaption, ER, and IR PROM per protocol and pt tolerance.     PATIENT EDUCATION: Education details:  Updated HEP with standing wall walks in flexion and gave pt a HEP handout.  Educated pt in correct form and appropriate frequency.  Educated pt he should not have increased pain with HEP.  POC and exercise form.  Person educated: Patient Education method: Explanation, Demonstration, Verbal cues and tactile cues, handout Education comprehension: verbalized understanding, returned demonstration, and verbal cues required     HOME EXERCISE PROGRAM: Access Code: [email or web address] URL: [email or web address] Date: 10/27/2021 Prepared by: Ronny Flurry  Exercises Circular Shoulder Pendulum with Table Support - 3 x daily - 7 x weekly - 2 sets - 10 reps Flexion-Extension Shoulder Pendulum with Table Support - 3 x daily - 7 x  weekly - 2 sets - 10 reps Seated Gripping Towel - 2-3 x daily - 7 x weekly - 2-3 sets - 10 reps Seated Shoulder Abduction Towel Slide at Table Top - 3 x daily - 7 x weekly - 1 sets - 10 reps - 5 hold Seated Shoulder Flexion Towel Slide at Table Top - 3 x daily - 7 x weekly - 1 sets - 10 reps - 5 hold Seated Shoulder External Rotation PROM on Table - 3 x daily - 7 x weekly - 1 sets - 10 reps - 5 hold Supine Shoulder Wand flexion AAROM - 2 x daily - 7 x weekly - 2 sets - 10 reps Supine Shoulder External Rotation in 45 Degrees Abduction AAROM  with Dowel - 2 x daily - 7 x weekly - 2 sets - 10 reps Supine Shoulder Flexion AROM - 2 x daily - 7 x weekly - 1-2 sets - 10 reps Seated Shoulder External Rotation AAROM with Dowel - 2 x daily - 7 x weekly - 2 sets - 10 reps Single Arm Serratus Punches - 1 x daily - 5-7 x weekly - 2 sets - 10 reps Supine Shoulder Alphabet - 1 x daily - 7 x weekly - 1 reps Prone Shoulder Extension - Single Arm - 1 x daily - 7 x weekly - 3 sets - 10 reps Standing Shoulder Flexion Wall Walk - 2 x daily - 7 x weekly - 1 sets - 10 reps       ASSESSMENT:   CLINICAL IMPRESSION:   Pt had soreness with L shoulder PROM which improved with PROM.  Pt also demonstrates improved L shoulder ROM with increased reps of PROM.  Pt had good ER PROM.  PT progressed ther ex per protocol and pt performed exercises well with cuing and instruction in correct form.  Pt is progressing appropriately with protocol.  He is progressing well with function and ROM including AROM.  Pt performed ER actively well without c/o's.  Pt responded well to Rx having no increased pain after Rx.  He should cont to benefit from cont skilled PT services per protocol to address ongoing goals and to assist in restoring PLOF.   Objective impairments include decreased activity tolerance, decreased ROM, decreased strength, hypomobility, impaired flexibility, impaired UE functional use, and pain. These impairments are limiting  patient from cleaning, community activity, driving, meal prep, occupation, laundry, yard work, and self care activities and reaching activities . Personal factors including 1 comorbidity: CAD with stent placement  are also affecting patient's functional outcome.     GOALS:     SHORT TERM GOALS:   STG Name Target Date Goal status  1 Pt will be independent and compliant with HEP for improved pain, ROM, strength, and function.  Baseline:  09/14/2021 GOAL MET  2 Pt will demo improved L shoulder PROM in flexion to 90 deg and ER to 25 deg for improved ROM and stiffness.   Baseline:  09/14/2021 GOAL MET  3 Pt will demo improved L shoulder PROM in flexion to 120 deg and ER to 40 deg for improved ROM, mobility, and stiffness. Baseline: 09/28/2021 GOAL MET  4 Pt will wean out of sling without adverse effects Baseline: 10/05/2021 INITIAL  5 Pt will tolerate the initiation of AAROM without adverse effects for improved mobility and progression of protocol to improve fxn.. Baseline: 10/05/2021 INITIAL  6 Pt will demo at least 140 deg of supine flexion AAROM and 45 deg ER for improved mobility and stiffness Baseline: 10/19/2021 INITIAL  7 Pt will demo at least 120 deg of flexion and scaption AAROM in standing for improved reaching Baseline: 10/26/2021 INITIAL  8 Pt will be able to actively elevate L UE > 90 deg without significant shoulder hike or significant pain 11/09/2021 INITIAL  9 Pt will be able to perform his self care activities with no > than minimal difficulty 11/02/2021 INITIAL    LONG TERM GOALS:    LTG Name Target Date Goal status  1 Pt will be able to perform his ADLs and IADLs without significant pain or difficulty.  12/14/2021 Baseline: 12/14/2021 INITIAL  2 Pt will be able to actively reach into an overhead cabinet without significant difficulty or shoulder hike.  Baseline: 11/23/2021 INITIAL  3 Pt will be able to perform his normal reaching and overhead activities without significant pain  or difficulty.  Baseline: 12/14/2021 INITIAL  4 Pt will demo 4/5 to 4+/5 MMT strength t/o L shoulder to assist with returning to functional carrying and lifting and to assist with returning to work activities with expected limitations.  Baseline: 12/14/2020 INITIAL                               PLAN:   PLANNED INTERVENTIONS: Therapeutic exercises, Therapeutic activity, Neuro Muscular re-education, Patient/Family education, Joint mobilization, Electrical stimulation, Cryotherapy, Moist heat, scar mobilization, Taping, and Manual therapy   PLAN FOR NEXT SESSION: MD messaged PT to use his large RCR repair protocol.  Cont with Dr. Eddie Dibbles RCR protocol and consider superior capsular reconstruction protocol.  Perform submaximal isometrics next visit and may add to HEP.  Cont with frequency of 2x/wk.   Ice to reduce pain and soreness.         Selinda Michaels III PT, DPT 10/27/21 1:51 PM

## 2021-10-29 ENCOUNTER — Other Ambulatory Visit: Payer: Self-pay

## 2021-10-29 ENCOUNTER — Ambulatory Visit (HOSPITAL_BASED_OUTPATIENT_CLINIC_OR_DEPARTMENT_OTHER): Payer: Managed Care, Other (non HMO) | Admitting: Physical Therapy

## 2021-10-29 ENCOUNTER — Encounter (HOSPITAL_BASED_OUTPATIENT_CLINIC_OR_DEPARTMENT_OTHER): Payer: Self-pay | Admitting: Physical Therapy

## 2021-10-29 DIAGNOSIS — M6281 Muscle weakness (generalized): Secondary | ICD-10-CM

## 2021-10-29 DIAGNOSIS — M25612 Stiffness of left shoulder, not elsewhere classified: Secondary | ICD-10-CM

## 2021-10-29 DIAGNOSIS — M25512 Pain in left shoulder: Secondary | ICD-10-CM | POA: Diagnosis not present

## 2021-10-29 NOTE — Therapy (Signed)
OUTPATIENT PHYSICAL THERAPY TREATMENT  NOTE   Patient Name: Brian KRAYNAK Sr. MRN: [id number] DOB:[birth year], 56 y.o., male Today's Date: 10/29/2021  PCP: Colon Branch, MD   PT End of Session - 10/29/21 0856     Visit Number 14    Number of Visits 32    Date for PT Re-Evaluation 11/22/21    Authorization Type [employer]    Progress Note Due on Visit --   10/30/2021   PT Start Time 0850    PT Stop Time 0935    PT Time Calculation (min) 45 min    Activity Tolerance Patient tolerated treatment well;No increased pain    Behavior During Therapy [facility] for tasks assessed/performed                   Past Medical History:  Diagnosis Date   Asthma    Borderline diabetes    CAD (coronary artery disease)    a. s/p anterior STEMI in 09/2011 with aspiration of occluded LAD with DES placed at that time, CTO of RCA noted. EF at 30%   Chest pain    CHF (congestive heart failure) ([place])    Gout    Granulomatosis    chronic granulomatosis (dx at age 65 @ [place])   Ischemic cardiomyopathy    a. EF at 30% by cath in 09/2011. Has refused echos in the past.    MI (myocardial infarction) ([place])    Obesity    SOB (shortness of breath)    Past Surgical History:  Procedure Laterality Date   CHOLECYSTECTOMY     LEFT HEART CATHETERIZATION WITH CORONARY ANGIOGRAM N/A 10/18/2011   Procedure: LEFT HEART CATHETERIZATION WITH CORONARY ANGIOGRAM;  Surgeon: Jettie Booze, MD;  Location: [facility] CATH LAB;  Service: Cardiovascular;  Laterality: N/A;   LUNG BIOPSY  1990s   PERCUTANEOUS CORONARY STENT INTERVENTION (PCI-S) N/A 10/18/2011   Procedure: PERCUTANEOUS CORONARY STENT INTERVENTION (PCI-S);  Surgeon: Jettie Booze, MD;  Location: [facility] CATH LAB;  Service: Cardiovascular;  Laterality: N/A;   Patient Active Problem List   Diagnosis Date Noted   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    Subacromial bursitis of left shoulder joint    Left shoulder pain 07/10/2021   PCP  NOTES >>>>>>>>>>>>>>>> 05/03/2017   Hemorrhoids 02/14/2012   CAD (coronary artery disease) 12/01/2011   Hyperlipidemia 12/01/2011   Ischemic cardiomyopathy 12/01/2011   Asthma 10/25/2011   Borderline diabetes 10/25/2011   Acute MI, anterior wall ([place]) 10/21/2011   Coronary atherosclerosis of native coronary artery 10/21/2011   Obesity, unspecified 10/21/2011   GOUT 02/24/2010   PCP: Colon Branch, MD   REFERRING PROVIDER: Vanetta Mulders, MD   REFERRING DIAG: [phone] (ICD-10-CM) - Left shoulder pain, unspecified chronicity; M75.102 (ICD-10-CM) - Unspecified rotator cuff tear or rupture of left shoulder   THERAPY DIAG:  Left shoulder pain, unspecified chronicity   Stiffness of left shoulder, not elsewhere classified   Muscle weakness (generalized)     ONSET DATE: 08/20/2021:  Left shoulder arthroscopy with superior capsular reconstruction, subacromial decompression with acromioplasty, and limited shoulder debridement.   SUBJECTIVE:  SUBJECTIVE STATEMENT: -Pt is 10 weeks s/p L shoulder superior capsular reconstruction and subacromial decompression with acromioplasty.  Pt denies any adverse effects after prior Rx and did have soreness.  He was sore in the inferior aspect of shoulder the following day with his exercises.  Pt is performing his HEP more frequently.  Pt states he tweaked his shoulder earlier this week.  He was crawling in his father's crawl space yesterday and had some pain though it went away.  Pt states he is improving and has improved shoulder mobility.  Pt reports improved reaching and is able to use his L UE more with daily activities.  Pt is able to reach top of his head and wash his hair.  Pt unable to perform overhead activities.  Pt unable to fish and hunt.  He is unable to perform his  occupational activities.     PERTINENT HISTORY: L shoulder superior capsular reconstruction (08/20/2021).  MI, CAD, Cadiac surgery with stent placement in 2013, obesity   PAIN:  Are you having pain? No NPRS scale: 0/10 current Pain location: shoulder Pain orientation: Left      PRECAUTIONS: Other: Per Dr. Eddie Dibbles large RCR protocol.  Also superior capsular reconstruction protocol.      OBJECTIVE:   TODAY'S TREATMENT:     Therapeutic Exercise: -Reviewed response to prior Rx, current function, HEP compliance, and pain level. -Pt performed: Supine flexion AROM 2x10 reps with head elevated S/L Shoulder Abduction AROM 2x10 reps Supine shoulder ABC x 1 rep S/L ER 2x10 reps  Standing wall walks in flexion x10 reps  Standing wand flexion x 10 reps Shoulder submaximal isometrics with 5-6 sec hold x 5 reps in flex, abd, ext, ER, and IR   -Updated HEP and gave pt a HEP handout.  Educated pt in correct form and appropriate frequency.  Educated pt he should not have increased pain with HEP.     Manual Therapy: -pt received L shoulder gentle distraction with oscillation and grade II inf and post L GH jt mobs to improve stiffness, pain, and normalize arthrokinematics -Pt received L shoulder flexion, Abd, scaption, ER, and IR PROM per protocol and pt tolerance.     PATIENT EDUCATION: Education details:  educated pt to decrease shoulder hike with standing AAROM.  Instructed pt to use ice to decrease soreness.  Updated HEP with submaximal isometrics and gave pt a handout.  Educated pt in correct form and appropriate frequency.  Educated pt he should not have increased pain with HEP.  POC and exercise form.  Person educated: Patient Education method: Explanation, Demonstration, Verbal cues and tactile cues, handout Education comprehension: verbalized understanding, returned demonstration, and verbal cues required     HOME EXERCISE PROGRAM: Access Code: [email or web address] URL:  [email or web address] Date: 10/29/2021 Prepared by: Ronny Flurry  Exercises Circular Shoulder Pendulum with Table Support - 3 x daily - 7 x weekly - 2 sets - 10 reps Flexion-Extension Shoulder Pendulum with Table Support - 3 x daily - 7 x weekly - 2 sets - 10 reps Seated Gripping Towel - 2-3 x daily - 7 x weekly - 2-3 sets - 10 reps Seated Shoulder Abduction Towel Slide at Table Top - 3 x daily - 7 x weekly - 1 sets - 10 reps - 5 hold Seated Shoulder Flexion Towel Slide at Table Top - 3 x daily - 7 x weekly - 1 sets - 10 reps - 5 hold Seated Shoulder External Rotation PROM on Table - 3 x daily - 7  x weekly - 1 sets - 10 reps - 5 hold Supine Shoulder Wand flexion AAROM - 2 x daily - 7 x weekly - 2 sets - 10 reps Supine Shoulder External Rotation in 45 Degrees Abduction AAROM with Dowel - 2 x daily - 7 x weekly - 2 sets - 10 reps Supine Shoulder Flexion AROM - 2 x daily - 7 x weekly - 1-2 sets - 10 reps Seated Shoulder External Rotation AAROM with Dowel - 2 x daily - 7 x weekly - 2 sets - 10 reps Single Arm Serratus Punches - 1 x daily - 5-7 x weekly - 2 sets - 10 reps Supine Shoulder Alphabet - 1 x daily - 7 x weekly - 1 reps Prone Shoulder Extension - Single Arm - 1 x daily - 7 x weekly - 3 sets - 10 reps Standing Shoulder Flexion Wall Walk - 2 x daily - 7 x weekly - 1 sets - 10 reps Isometric Shoulder Flexion at Wall - 1 x daily - 5 x weekly - 1-2 sets - 10 reps - 5-6 seconds hold Isometric Shoulder Abduction at Wall - 1 x daily - 5 x weekly - 1-2 sets - 10 reps - 5-6 seconds hold Isometric Shoulder Extension at Wall - 1 x daily - 5 x weekly - 1-2 sets - 10 reps - 5-6 seconds hold Standing Isometric Shoulder External Rotation with Doorway and Towel Roll - 1 x daily - 5 x weekly - 1-2 sets - 10 reps - 5-6 seconds hold Standing Isometric Shoulder Internal Rotation at Doorway - 1 x daily - 5 x weekly - 1-2 sets - 10 reps - 5-6 seconds hold        ASSESSMENT:   CLINICAL  IMPRESSION:   Pt is progressing well with function and ROM including PROM, AROM, and AAROM.  He is progressing appropriately with protocol and performed exercises well.  Pt does have a compensatory shoulder hike with standing wand flexion AAROM.  Educated Pt with correct form and used a Geologist, engineering for visual cuing to decrease shoulder hike.  Pt responded well to Rx reporting no pain after Rx.  He states he feels good after Rx though does have some soreness later.  PT instructed pt to use ice if he has soreness. He should cont to benefit from cont skilled PT services per protocol to address ongoing goals and to assist in restoring PLOF.    Objective impairments include decreased activity tolerance, decreased ROM, decreased strength, hypomobility, impaired flexibility, impaired UE functional use, and pain. These impairments are limiting patient from cleaning, community activity, driving, meal prep, occupation, laundry, yard work, and self care activities and reaching activities . Personal factors including 1 comorbidity: CAD with stent placement  are also affecting patient's functional outcome.     GOALS:     SHORT TERM GOALS:   STG Name Target Date Goal status  1 Pt will be independent and compliant with HEP for improved pain, ROM, strength, and function.  Baseline:  09/14/2021 GOAL MET  2 Pt will demo improved L shoulder PROM in flexion to 90 deg and ER to 25 deg for improved ROM and stiffness.   Baseline:  09/14/2021 GOAL MET  3 Pt will demo improved L shoulder PROM in flexion to 120 deg and ER to 40 deg for improved ROM, mobility, and stiffness. Baseline: 09/28/2021 GOAL MET  4 Pt will wean out of sling without adverse effects Baseline: 10/05/2021 INITIAL  5 Pt will tolerate the initiation  of AAROM without adverse effects for improved mobility and progression of protocol to improve fxn.. Baseline: 10/05/2021 INITIAL  6 Pt will demo at least 140 deg of supine flexion AAROM and 45 deg ER for  improved mobility and stiffness Baseline: 10/19/2021 INITIAL  7 Pt will demo at least 120 deg of flexion and scaption AAROM in standing for improved reaching Baseline: 10/26/2021 INITIAL  8 Pt will be able to actively elevate L UE > 90 deg without significant shoulder hike or significant pain 11/09/2021 INITIAL  9 Pt will be able to perform his self care activities with no > than minimal difficulty 11/02/2021 INITIAL    LONG TERM GOALS:    LTG Name Target Date Goal status  1 Pt will be able to perform his ADLs and IADLs without significant pain or difficulty.  12/14/2021 Baseline: 12/14/2021 INITIAL  2 Pt will be able to actively reach into an overhead cabinet without significant difficulty or shoulder hike.  Baseline: 11/23/2021 INITIAL  3 Pt will be able to perform his normal reaching and overhead activities without significant pain or difficulty.  Baseline: 12/14/2021 INITIAL  4 Pt will demo 4/5 to 4+/5 MMT strength t/o L shoulder to assist with returning to functional carrying and lifting and to assist with returning to work activities with expected limitations.  Baseline: 12/14/2020 INITIAL                               PLAN:   PLANNED INTERVENTIONS: Therapeutic exercises, Therapeutic activity, Neuro Muscular re-education, Patient/Family education, Joint mobilization, Electrical stimulation, Cryotherapy, Moist heat, scar mobilization, Taping, and Manual therapy   PLAN FOR NEXT SESSION: MD messaged PT to use his large RCR repair protocol.  Cont with Dr. Eddie Dibbles RCR protocol and consider superior capsular reconstruction protocol.  Perform submaximal isometrics next visit and may add to HEP.  Cont with frequency of 2x/wk.   Ice to reduce pain and soreness.         Selinda Michaels III PT, DPT 10/29/21 8:48 PM

## 2021-11-02 ENCOUNTER — Encounter (HOSPITAL_BASED_OUTPATIENT_CLINIC_OR_DEPARTMENT_OTHER): Payer: 59 | Admitting: Physical Therapy

## 2021-11-03 ENCOUNTER — Other Ambulatory Visit: Payer: Self-pay

## 2021-11-03 ENCOUNTER — Encounter (HOSPITAL_BASED_OUTPATIENT_CLINIC_OR_DEPARTMENT_OTHER): Payer: Self-pay | Admitting: Physical Therapy

## 2021-11-03 ENCOUNTER — Ambulatory Visit (HOSPITAL_BASED_OUTPATIENT_CLINIC_OR_DEPARTMENT_OTHER): Payer: Managed Care, Other (non HMO) | Admitting: Physical Therapy

## 2021-11-03 DIAGNOSIS — M25612 Stiffness of left shoulder, not elsewhere classified: Secondary | ICD-10-CM

## 2021-11-03 DIAGNOSIS — M25512 Pain in left shoulder: Secondary | ICD-10-CM | POA: Diagnosis not present

## 2021-11-03 DIAGNOSIS — M6281 Muscle weakness (generalized): Secondary | ICD-10-CM

## 2021-11-03 NOTE — Therapy (Signed)
OUTPATIENT PHYSICAL THERAPY TREATMENT  NOTE   Patient Name: Brian Gross. MRN: [id number] DOB:06-29-1966, 56 y.o., male Today's Date: 11/03/2021  PCP: Colon Branch, MD   PT End of Session - 11/03/21 1020     Visit Number 15    Number of Visits 32    Date for PT Re-Evaluation 11/22/21    Authorization Type [employer]    PT Start Time 0936    PT Stop Time 1018    PT Time Calculation (min) 42 min    Activity Tolerance Patient tolerated treatment well;No increased pain    Behavior During Therapy [facility] for tasks assessed/performed                    Past Medical History:  Diagnosis Date   Asthma    Borderline diabetes    CAD (coronary artery disease)    a. s/p anterior STEMI in 09/2011 with aspiration of occluded LAD with DES placed at that time, CTO of RCA noted. 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 EF at 30% by cath in 09/2011. Has refused echos in the past.    MI (myocardial infarction) ([place])    Obesity    SOB (shortness of breath)    Past Surgical History:  Procedure Laterality Date   CHOLECYSTECTOMY     LEFT HEART CATHETERIZATION WITH CORONARY ANGIOGRAM N/A 10/18/2011   Procedure: LEFT HEART CATHETERIZATION WITH CORONARY ANGIOGRAM;  Surgeon: Jettie Booze, MD;  Location: [facility] CATH LAB;  Service: Cardiovascular;  Laterality: N/A;   LUNG BIOPSY  1990s   PERCUTANEOUS CORONARY STENT INTERVENTION (PCI-S) N/A 10/18/2011   Procedure: PERCUTANEOUS CORONARY STENT INTERVENTION (PCI-S);  Surgeon: Jettie Booze, MD;  Location: [facility] CATH LAB;  Service: Cardiovascular;  Laterality: N/A;   Patient Active Problem List   Diagnosis Date Noted   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    Subacromial bursitis of left shoulder joint    Left shoulder pain 07/10/2021   PCP NOTES >>>>>>>>>>>>>>>> 05/03/2017    Hemorrhoids 02/14/2012   CAD (coronary artery disease) 12/01/2011   Hyperlipidemia 12/01/2011   Ischemic cardiomyopathy 12/01/2011   Asthma 10/25/2011   Borderline diabetes 10/25/2011   Acute MI, anterior wall ([place]) 10/21/2011   Coronary atherosclerosis of native coronary artery 10/21/2011   Obesity, unspecified 10/21/2011   GOUT 02/24/2010   PCP: Colon Branch, MD   REFERRING PROVIDER: Vanetta Mulders, MD   REFERRING DIAG: 9524030179 (ICD-10-CM) - Left shoulder pain, unspecified chronicity; M75.102 (ICD-10-CM) - Unspecified rotator cuff tear or rupture of left shoulder   THERAPY DIAG:  Left shoulder pain, unspecified chronicity   Stiffness of left shoulder, not elsewhere classified   Muscle weakness (generalized)     ONSET DATE: 08/20/2021:  Left shoulder arthroscopy with superior capsular reconstruction, subacromial decompression with acromioplasty, and limited shoulder debridement.   SUBJECTIVE:  SUBJECTIVE STATEMENT: -Pt is 10 weeks and 5 days s/p L shoulder superior capsular reconstruction and subacromial decompression with acromioplasty.  Pt denies any adverse effects after prior Rx and did have soreness.  Pt is performing his HEP more frequently.  Pt states the wall walks have helped his ROM.  Pt reports improved UE elevation.  Pt states he did some raking and shoveling rock but used more of his R UE.  Pt states he is improving and has improved shoulder mobility.  Pt reports improved reaching and is able to use his L UE more with daily activities.  Pt is able to reach top of his head and wash his hair.  Pt unable to perform overhead activities.  Pt unable to fish and hunt.  He is unable to perform his occupational activities.     PERTINENT HISTORY: L shoulder superior capsular reconstruction (08/20/2021).   MI, CAD, Cadiac surgery with stent placement in 2013, obesity   PAIN:  Are you having pain? No NPRS scale: 0/10 current Pain location: shoulder Pain orientation: Left      PRECAUTIONS: Other: Per Dr. Eddie Dibbles large RCR protocol.  Also superior capsular reconstruction protocol.      OBJECTIVE:   TODAY'S TREATMENT:     Therapeutic Exercise: -Reviewed response to prior Rx, current function, HEP compliance, and pain level. -Pt performed: Supine serratus punches 2x10  S/L Shoulder Abduction AROM 2x10 reps Supine shoulder ABC x 1 rep S/L ER 2x10 reps  Standing wall walks in flexion x10 reps  Standing Jobe's flexion 2x10 reps Shoulder submaximal isometrics with 5-6 sec hold x 5 reps in flex, ER, nad IR   Manual Therapy: -pt received L shoulder gentle distraction with oscillation and grade II inf and post L GH jt mobs to improve stiffness, pain, and normalize arthrokinematics -Pt received L shoulder flexion, Abd, scaption, ER, and IR PROM per protocol and pt tolerance.     PATIENT EDUCATION: Education details:  educated pt to not shovel with L UE. decrease shoulder hike with standing AAROM.  Used a Geologist, engineering for visual cuing to reduce compensatory shoulder hike with UE elevation.  POC and exercise form.  Person educated: Patient Education method: Explanation, Demonstration, Verbal cues and tactile cues, handout Education comprehension: verbalized understanding, returned demonstration, and verbal cues required     HOME EXERCISE PROGRAM: Access Code: [email or web address] URL: [email or web address] Date: 10/29/2021 Prepared by: Ronny Flurry  Exercises Circular Shoulder Pendulum with Table Support - 3 x daily - 7 x weekly - 2 sets - 10 reps Flexion-Extension Shoulder Pendulum with Table Support - 3 x daily - 7 x weekly - 2 sets - 10 reps Seated Gripping Towel - 2-3 x daily - 7 x weekly - 2-3 sets - 10 reps Seated Shoulder Abduction Towel Slide at Table Top - 3 x daily - 7  x weekly - 1 sets - 10 reps - 5 hold Seated Shoulder Flexion Towel Slide at Table Top - 3 x daily - 7 x weekly - 1 sets - 10 reps - 5 hold Seated Shoulder External Rotation PROM on Table - 3 x daily - 7 x weekly - 1 sets - 10 reps - 5 hold Supine Shoulder Wand flexion AAROM - 2 x daily - 7 x weekly - 2 sets - 10 reps Supine Shoulder External Rotation in 45 Degrees Abduction AAROM with Dowel - 2 x daily - 7 x weekly - 2 sets - 10 reps Supine Shoulder Flexion AROM - 2 x daily - 7 x  weekly - 1-2 sets - 10 reps Seated Shoulder External Rotation AAROM with Dowel - 2 x daily - 7 x weekly - 2 sets - 10 reps Single Arm Serratus Punches - 1 x daily - 5-7 x weekly - 2 sets - 10 reps Supine Shoulder Alphabet - 1 x daily - 7 x weekly - 1 reps Prone Shoulder Extension - Single Arm - 1 x daily - 7 x weekly - 3 sets - 10 reps Standing Shoulder Flexion Wall Walk - 2 x daily - 7 x weekly - 1 sets - 10 reps Isometric Shoulder Flexion at Wall - 1 x daily - 5 x weekly - 1-2 sets - 10 reps - 5-6 seconds hold Isometric Shoulder Abduction at Wall - 1 x daily - 5 x weekly - 1-2 sets - 10 reps - 5-6 seconds hold Isometric Shoulder Extension at Wall - 1 x daily - 5 x weekly - 1-2 sets - 10 reps - 5-6 seconds hold Standing Isometric Shoulder External Rotation with Doorway and Towel Roll - 1 x daily - 5 x weekly - 1-2 sets - 10 reps - 5-6 seconds hold Standing Isometric Shoulder Internal Rotation at Doorway - 1 x daily - 5 x weekly - 1-2 sets - 10 reps - 5-6 seconds hold        ASSESSMENT:   CLINICAL IMPRESSION:   Pt is progressing well with ROM and functional usage of L UE.  He is improving with UE elevation though does have compensatory shoulder hike with UE elevation.  PT provided verbal, tactile, and visual cues to reduce shoulder hike with UE elevation.  Pt is improving with UE elevation A/AAROM.  He is progressing appropriately with protocol and performed exercises well.  Pt is using L UE with manual labor  activities including shoveling and raking and was instructed to not perform.  Pt responded well to Rx reporting no pain after Rx.  He should cont to benefit from cont skilled PT services per protocol to address ongoing goals and to assist in restoring PLOF.    Objective impairments include decreased activity tolerance, decreased ROM, decreased strength, hypomobility, impaired flexibility, impaired UE functional use, and pain. These impairments are limiting patient from cleaning, community activity, driving, meal prep, occupation, laundry, yard work, and self care activities and reaching activities . Personal factors including 1 comorbidity: CAD with stent placement  are also affecting patient's functional outcome.     GOALS:     SHORT TERM GOALS:   STG Name Target Date Goal status  1 Pt will be independent and compliant with HEP for improved pain, ROM, strength, and function.  Baseline:  09/14/2021 GOAL MET  2 Pt will demo improved L shoulder PROM in flexion to 90 deg and ER to 25 deg for improved ROM and stiffness.   Baseline:  09/14/2021 GOAL MET  3 Pt will demo improved L shoulder PROM in flexion to 120 deg and ER to 40 deg for improved ROM, mobility, and stiffness. Baseline: 09/28/2021 GOAL MET  4 Pt will wean out of sling without adverse effects Baseline: 10/05/2021 INITIAL  5 Pt will tolerate the initiation of AAROM without adverse effects for improved mobility and progression of protocol to improve fxn.. Baseline: 10/05/2021 INITIAL  6 Pt will demo at least 140 deg of supine flexion AAROM and 45 deg ER for improved mobility and stiffness Baseline: 10/19/2021 INITIAL  7 Pt will demo at least 120 deg of flexion and scaption AAROM in standing for improved reaching  Baseline: 10/26/2021 INITIAL  8 Pt will be able to actively elevate L UE > 90 deg without significant shoulder hike or significant pain 11/09/2021 INITIAL  9 Pt will be able to perform his self care activities with no > than  minimal difficulty 11/02/2021 INITIAL    LONG TERM GOALS:    LTG Name Target Date Goal status  1 Pt will be able to perform his ADLs and IADLs without significant pain or difficulty.  12/14/2021 Baseline: 12/14/2021 INITIAL  2 Pt will be able to actively reach into an overhead cabinet without significant difficulty or shoulder hike.  Baseline: 11/23/2021 INITIAL  3 Pt will be able to perform his normal reaching and overhead activities without significant pain or difficulty.  Baseline: 12/14/2021 INITIAL  4 Pt will demo 4/5 to 4+/5 MMT strength t/o L shoulder to assist with returning to functional carrying and lifting and to assist with returning to work activities with expected limitations.  Baseline: 12/14/2020 INITIAL                               PLAN:   PLANNED INTERVENTIONS: Therapeutic exercises, Therapeutic activity, Neuro Muscular re-education, Patient/Family education, Joint mobilization, Electrical stimulation, Cryotherapy, Moist heat, scar mobilization, Taping, and Manual therapy   PLAN FOR NEXT SESSION: PN next visit.  MD messaged PT to use his large RCR repair protocol.  Cont with Dr. Eddie Dibbles RCR protocol and consider superior capsular reconstruction protocol.  Perform submaximal isometrics next visit and may add to HEP.  Cont with frequency of 2x/wk.   Ice to reduce pain and soreness.         Selinda Michaels III PT, DPT 11/03/21 11:41 PM

## 2021-11-05 ENCOUNTER — Other Ambulatory Visit: Payer: Self-pay

## 2021-11-05 ENCOUNTER — Ambulatory Visit (HOSPITAL_BASED_OUTPATIENT_CLINIC_OR_DEPARTMENT_OTHER): Payer: Managed Care, Other (non HMO) | Admitting: Physical Therapy

## 2021-11-05 ENCOUNTER — Encounter (HOSPITAL_BASED_OUTPATIENT_CLINIC_OR_DEPARTMENT_OTHER): Payer: Self-pay | Admitting: Physical Therapy

## 2021-11-05 DIAGNOSIS — M6281 Muscle weakness (generalized): Secondary | ICD-10-CM

## 2021-11-05 DIAGNOSIS — M25512 Pain in left shoulder: Secondary | ICD-10-CM

## 2021-11-05 DIAGNOSIS — M25612 Stiffness of left shoulder, not elsewhere classified: Secondary | ICD-10-CM

## 2021-11-05 NOTE — Therapy (Signed)
OUTPATIENT PHYSICAL THERAPY TREATMENT  NOTE/PROGRESS NOTE   Patient Name: Brian TURNBOUGH Sr. MRN: [id number] DOB:1966-01-06, 56 y.o., male Today's Date: 11/05/2021  PCP: Colon Branch, MD   PT End of Session - 11/05/21 [phone]     Visit Number 16    Number of Visits 32    Date for PT Re-Evaluation 12/17/21    Authorization Type [employer]    Progress Note Due on Visit --   12/06/2021   PT Start Time 0853    PT Stop Time 0935    PT Time Calculation (min) 42 min    Activity Tolerance Patient tolerated treatment well;No increased pain    Behavior During Therapy [facility] for tasks assessed/performed                     Past Medical History:  Diagnosis Date   Asthma    Borderline diabetes    CAD (coronary artery disease)    a. s/p anterior STEMI in 09/2011 with aspiration of occluded LAD with DES placed at that time, CTO of RCA noted. EF at 30%   Chest pain    CHF (congestive heart failure) ([place])    Gout    Granulomatosis    chronic granulomatosis (dx at age 82 @ [place])   Ischemic cardiomyopathy    a. EF at 30% by cath in 09/2011. Has refused echos in the past.    MI (myocardial infarction) ([place])    Obesity    SOB (shortness of breath)    Past Surgical History:  Procedure Laterality Date   CHOLECYSTECTOMY     LEFT HEART CATHETERIZATION WITH CORONARY ANGIOGRAM N/A 10/18/2011   Procedure: LEFT HEART CATHETERIZATION WITH CORONARY ANGIOGRAM;  Surgeon: Jettie Booze, MD;  Location: [facility] CATH LAB;  Service: Cardiovascular;  Laterality: N/A;   LUNG BIOPSY  1990s   PERCUTANEOUS CORONARY STENT INTERVENTION (PCI-S) N/A 10/18/2011   Procedure: PERCUTANEOUS CORONARY STENT INTERVENTION (PCI-S);  Surgeon: Jettie Booze, MD;  Location: [facility] CATH LAB;  Service: Cardiovascular;  Laterality: N/A;   Patient Active Problem List   Diagnosis Date Noted   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    Subacromial bursitis of left shoulder joint    Left shoulder pain  07/10/2021   PCP NOTES >>>>>>>>>>>>>>>> 05/03/2017   Hemorrhoids 02/14/2012   CAD (coronary artery disease) 12/01/2011   Hyperlipidemia 12/01/2011   Ischemic cardiomyopathy 12/01/2011   Asthma 10/25/2011   Borderline diabetes 10/25/2011   Acute MI, anterior wall ([place]) 10/21/2011   Coronary atherosclerosis of native coronary artery 10/21/2011   Obesity, unspecified 10/21/2011   GOUT 02/24/2010   PCP: Colon Branch, MD   REFERRING PROVIDER: Vanetta Mulders, MD   REFERRING DIAG: [id number] (ICD-10-CM) - Left shoulder pain, unspecified chronicity; M75.102 (ICD-10-CM) - Unspecified rotator cuff tear or rupture of left shoulder   THERAPY DIAG:  Left shoulder pain, unspecified chronicity   Stiffness of left shoulder, not elsewhere classified   Muscle weakness (generalized)     ONSET DATE: 08/20/2021:  Left shoulder arthroscopy with superior capsular reconstruction, subacromial decompression with acromioplasty, and limited shoulder debridement.   SUBJECTIVE:  SUBJECTIVE STATEMENT: -Pt is 11 weeks s/p L shoulder superior capsular reconstruction and subacromial decompression with acromioplasty.  Pt denies any adverse effects after prior Rx and did not have soreness.  Pt is performing his HEP more frequently.  Pt states the wall walks have helped his ROM.  Pt states he used a chainsaw yesterday and had some soreness though not pain.   -FUNCTIONAL IMPROVEMENTS:  Pt able to raise arm up and apply deodorant.  Improved reaching upward and across body.  Pt able to reach behind head and wash hair.  Improved tolerance with using L UE.   Hanging clothes.  Dressing and bathing. Slight to minimal difficulty with self care activities.   -FUNCTIONAL DEFICITS:  reaching and overhead activities.  unable to fish and hunt.  He is  unable to perform his occupational activities.   PERTINENT HISTORY: L shoulder superior capsular reconstruction (08/20/2021).  MI, CAD, Cadiac surgery with stent placement in 2013, obesity   PAIN:  Are you having pain? No NPRS scale: 0/10 current Pain location: shoulder Pain orientation: Left      PRECAUTIONS: Other: Per Dr. Eddie Dibbles large RCR protocol.  Also superior capsular reconstruction protocol.    OBJECTIVE:   TODAY'S TREATMENT:     Therapeutic Exercise: -Reviewed response to prior Rx, current function, HEP compliance, and pain level.  -FOTO:  61 with a goal of 66.  -L shoulder AROM/PROM:   Flexion:  Supine: 162/167 Standing: 135 deg,  Scaption:  117 deg, Abd:  95/147 deg, ER:  71/75 deg, IR:  55/61.  -Pt performed: Supine serratus punches 2x10  Supine shoulder ABC x 1 rep S/L ER 2x10 reps  Standing wall walks in flexion x10 reps  Standing Jobe's flexion 2x10 reps in front of mirror Standing scaption 2x10 reps in front of mirror   Manual Therapy: -pt received L shoulder gentle distraction with oscillation and grade II inf and post L GH jt mobs to improve stiffness, pain, and normalize arthrokinematics -Pt received L shoulder flexion, Abd, scaption, ER, and IR PROM per protocol and pt tolerance.     PATIENT EDUCATION: Education details:  educated pt to not use chainsaw. decrease shoulder hike with standing AAROM.  Used a Geologist, engineering for visual cuing to reduce compensatory shoulder hike with UE elevation.  POC and exercise form.  Objective findings.  Person educated: Patient Education method: Explanation, Demonstration, Verbal cues and tactile cues, handout Education comprehension: verbalized understanding, returned demonstration, and verbal cues required     HOME EXERCISE PROGRAM: Access Code: [email or web address] URL: [email or web address] Date: 10/29/2021 Prepared by: Ronny Flurry  Exercises Circular Shoulder Pendulum with Table Support - 3 x daily -  7 x weekly - 2 sets - 10 reps Flexion-Extension Shoulder Pendulum with Table Support - 3 x daily - 7 x weekly - 2 sets - 10 reps Seated Gripping Towel - 2-3 x daily - 7 x weekly - 2-3 sets - 10 reps Seated Shoulder Abduction Towel Slide at Table Top - 3 x daily - 7 x weekly - 1 sets - 10 reps - 5 hold Seated Shoulder Flexion Towel Slide at Table Top - 3 x daily - 7 x weekly - 1 sets - 10 reps - 5 hold Seated Shoulder External Rotation PROM on Table - 3 x daily - 7 x weekly - 1 sets - 10 reps - 5 hold Supine Shoulder Wand flexion AAROM - 2 x daily - 7 x weekly - 2 sets - 10 reps Supine Shoulder External Rotation in 45  Degrees Abduction AAROM with Dowel - 2 x daily - 7 x weekly - 2 sets - 10 reps Supine Shoulder Flexion AROM - 2 x daily - 7 x weekly - 1-2 sets - 10 reps Seated Shoulder External Rotation AAROM with Dowel - 2 x daily - 7 x weekly - 2 sets - 10 reps Single Arm Serratus Punches - 1 x daily - 5-7 x weekly - 2 sets - 10 reps Supine Shoulder Alphabet - 1 x daily - 7 x weekly - 1 reps Prone Shoulder Extension - Single Arm - 1 x daily - 7 x weekly - 3 sets - 10 reps Standing Shoulder Flexion Wall Walk - 2 x daily - 7 x weekly - 1 sets - 10 reps Isometric Shoulder Flexion at Wall - 1 x daily - 5 x weekly - 1-2 sets - 10 reps - 5-6 seconds hold Isometric Shoulder Abduction at Wall - 1 x daily - 5 x weekly - 1-2 sets - 10 reps - 5-6 seconds hold Isometric Shoulder Extension at Wall - 1 x daily - 5 x weekly - 1-2 sets - 10 reps - 5-6 seconds hold Standing Isometric Shoulder External Rotation with Doorway and Towel Roll - 1 x daily - 5 x weekly - 1-2 sets - 10 reps - 5-6 seconds hold Standing Isometric Shoulder Internal Rotation at Doorway - 1 x daily - 5 x weekly - 1-2 sets - 10 reps - 5-6 seconds hold        ASSESSMENT:   CLINICAL IMPRESSION:   Pt is making good progress in all areas and is progressing appropriately per protocol.  He demonstrates improved ROM including PROM and AROM in  all planes.  Pt is improving with functional usage of L UE as evidenced by subjective reports.  He demonstrates improved self perceived disability with FOTO score improving from 38 prior to 61 currently.  Pt is improving with UE elevation and continues to have L shoulder compensatory hike.  Pt demonstrates improved shoulder hike with multi-modal cuing.  Pt does require instruction to not overexert his L UE with manual labor activities.  Pt has met all STG's except partially meeting #7,8.  Pt responded well to Rx reporting no pain after Rx.  He should cont to benefit from cont skilled PT services per protocol to address ongoing goals and to assist in restoring PLOF.     Objective impairments include decreased activity tolerance, decreased ROM, decreased strength, hypomobility, impaired flexibility, impaired UE functional use, and pain. These impairments are limiting patient from cleaning, community activity, driving, meal prep, occupation, laundry, yard work, and self care activities and reaching activities . Personal factors including 1 comorbidity: CAD with stent placement  are also affecting patient's functional outcome.     GOALS:     SHORT TERM GOALS:   STG Name Target Date Goal status  1 Pt will be independent and compliant with HEP for improved pain, ROM, strength, and function.  Baseline:  09/14/2021 GOAL MET  2 Pt will demo improved L shoulder PROM in flexion to 90 deg and ER to 25 deg for improved ROM and stiffness.   Baseline:  09/14/2021 GOAL MET  3 Pt will demo improved L shoulder PROM in flexion to 120 deg and ER to 40 deg for improved ROM, mobility, and stiffness. Baseline: 09/28/2021 GOAL MET  4 Pt will wean out of sling without adverse effects Baseline: 10/05/2021 GOAL MET  5 Pt will tolerate the initiation of AAROM without adverse effects  for improved mobility and progression of protocol to improve fxn.. Baseline: 10/05/2021 GOAL MET  6 Pt will demo at least 140 deg of supine  flexion AAROM and 45 deg ER for improved mobility and stiffness Baseline: 10/19/2021 GOAL MET  7 Pt will demo at least 120 deg of flexion and scaption AAROM in standing for improved reaching Baseline: 10/26/2021 95% met  8 Pt will be able to actively elevate L UE > 90 deg without significant shoulder hike or significant pain 11/09/2021 PARTIALLY MET  9 Pt will be able to perform his self care activities with no > than minimal difficulty 11/02/2021 GOAL MET    LONG TERM GOALS:    LTG Name Target Date Goal status  1 Pt will be able to perform his ADLs and IADLs without significant pain or difficulty.  Baseline: 12/17/2021 ONGOING  2 Pt will be able to actively reach into an overhead cabinet without significant difficulty or shoulder hike.  Baseline: 11/23/2021 ONGOING  3 Pt will be able to perform his normal reaching and overhead activities without significant pain or difficulty.  Baseline: 12/17/2021 ONGOING  4 Pt will demo 4/5 to 4+/5 MMT strength t/o L shoulder to assist with returning to functional carrying and lifting and to assist with returning to work activities with expected limitations.  Baseline: 12/17/2021 INITIAL                               PLAN:  FREQUENCY: 2 TIMES PER WEEK  DURATION:  6 WEEKS   PLANNED INTERVENTIONS: Therapeutic exercises, Therapeutic activity, Neuro Muscular re-education, Patient/Family education, Joint mobilization, Electrical stimulation, Cryotherapy, Moist heat, scar mobilization, Taping, and Manual therapy   PLAN FOR NEXT SESSION: PN completed.  MD messaged PT to use his large RCR repair protocol.  Cont with Dr. Eddie Dibbles RCR protocol and consider superior capsular reconstruction protocol.        Selinda Michaels III PT, DPT 11/05/21 10:35 PM

## 2021-11-05 NOTE — Progress Notes (Signed)
Cardiology Office Note   Date:  11/06/2021   ID:  Brian JeansSchonn E Huge Sr., DOB 04-26-1966, MRN [id number]    PCP:  Wanda PlumpPaz, Jose E, MD  Cardiologist:   Rollene RotundaJames Samara Stankowski, MD   Chief Complaint  Patient presents with   Coronary Artery Disease      History of Present Illness: Brian CouncilmanSchonn E Barto Sr. is a 56 y.o. male who presents for evaluation of CAD. He had a negative stress perfusion study 2008. Unfortunately he presented in late December of 2012 with an anterior myocardial infarction. This was a late presentation. He did have an occluded LAD which was treated with a drug-eluting stent. There was also RCA occlusion which was apparently chronic.  He did not come back for follow-up for about 2 years until he got some insurance. He stopped his medication. He then came back in  2015. I tried to get an ultrasound but he could not afford this.  I saw him in 2017 and he was not able to afford all of his medicines.  In 2018 he was on [place]. He had a [employer] Myov[employer].  There was a larger area of anterior/anteroseptal and apical wall defect consistent with scar.  The EF was 35%.  At the last visit I increased his Cozaar.     Since I last saw him he has done well.  He denies any new cardiovascular symptoms.  He had shoulder surgery so he has been recovering from this.  He has not been able to do as much but he has been doing physical activity with physical therapy.  The patient denies any new symptoms such as chest discomfort, neck or arm discomfort. There has been no new shortness of breath, PND or orthopnea. There have been no reported palpitations, presyncope or syncope.  He felt lightheaded when I change his Cozaar at the last visit.  He has been reluctant to start medications but he has been compliant with the current list.  He eventually learned to tolerate it.   Past Medical History:  Diagnosis Date   Asthma    Borderline diabetes    CAD (coronary artery  disease)    a. s/p anterior STEMI in 09/2011 with aspiration of occluded LAD with DES placed at that time, CTO of RCA noted. EF at 30%   Chest pain    CHF (congestive heart failure) (HCC)    Gout    Granulomatosis    chronic granulomatosis (dx at age 56 @ [facility])   Ischemic cardiomyopathy    a. EF at 30% by cath in 09/2011. Has refused echos in the past.    MI (myocardial infarction) (HCC)    Obesity    SOB (shortness of breath)     Past Surgical History:  Procedure Laterality Date   CHOLECYSTECTOMY     LEFT HEART CATHETERIZATION WITH CORONARY ANGIOGRAM N/A 10/18/2011   Procedure: LEFT HEART CATHETERIZATION WITH CORONARY ANGIOGRAM;  Surgeon: Corky CraftsJayadeep S Varanasi, MD;  [facility];  Service: Cardiovascular;  Laterality: N/A;   LUNG BIOPSY  1990s   PERCUTANEOUS CORONARY STENT INTERVENTION (PCI-S) N/A 10/18/2011   Procedure: PERCUTANEOUS CORONARY STENT INTERVENTION (PCI-S);  Surgeon: Corky CraftsJayadeep S Varanasi, MD;  [facility];  Service: Cardiovascular;  Laterality: N/A;     Current Outpatient Medications  Medication Sig Dispense Refill   acetaminophen (TYLENOL) 500 MG tablet Take 500 mg by mouth every 6 (six) hours as needed for moderate pain.  albuterol (VENTOLIN HFA) 108 (90 Base) MCG/ACT inhaler Inhale 1-2 puffs into the lungs every 6 (six) hours as needed for wheezing or shortness of breath. 18 g 2   aspirin EC 325 MG tablet Take 1 tablet (325 mg total) by mouth daily. 30 tablet 0   furosemide (LASIX) 20 MG tablet TAKE 1 TABLET BY MOUTH EVERY DAY 90 tablet 2   metoprolol succinate (TOPROL-XL) 100 MG 24 hr tablet Take 1 tablet (100 mg total) by mouth daily. Take with or immediately following a meal. 90 tablet 2   nitroGLYCERIN (NITROSTAT) 0.4 MG SL tablet Place 1 tablet (0.4 mg total) under the tongue every 5 (five) minutes as needed for chest pain. 25 tablet 3   rosuvastatin (CRESTOR) 20 MG tablet TAKE 1 TABLET BY MOUTH EVERY DAY 90 tablet 3   losartan  (COZAAR) 100 MG tablet Take 1 tablet (100 mg total) by mouth daily. 90 tablet 3   No current facility-administered medications for this visit.    Allergies:   Morphine    ROS:  Please see the history of present illness.   Otherwise, review of systems are positive for none.   All other systems are reviewed and negative.    PHYSICAL EXAM: VS:  BP 116/70    Pulse 66    Ht 5' 9.5" (1.765 m)    Wt 294 lb 9.6 oz (133.6 kg)    SpO2 96%    BMI 42.88 kg/m  , BMI Body mass index is 42.88 kg/m. GENERAL:  Well appearing NECK:  No jugular venous distention, waveform within normal limits, carotid upstroke brisk and symmetric, no bruits, no thyromegaly LUNGS:  Clear to auscultation bilaterally CHEST:  Unremarkable HEART:  PMI not displaced or sustained,S1 and S2 within normal limits, no S3, no S4, no clicks, no rubs, no murmurs ABD:  Flat, positive bowel sounds normal in frequency in pitch, no bruits, no rebound, no guarding, no midline pulsatile mass, no hepatomegaly, no splenomegaly EXT:  2 plus pulses throughout, no edema, no cyanosis no clubbing   EKG:  EKG is  ordered today. The ekg ordered today demonstrates sinus rhythm, rate 66, axis within normal limits, old anteroseptal infarct, nonspecific T wave changes with lateral T wave inversions unchanged from previous, low voltage in the limb in chest leads.     Recent Labs: 08/13/2021: ALT 20; BUN 14; Creatinine, Ser 1.22; Hemoglobin 15.7; Platelets 203; Potassium 4.3; Sodium 134    Lipid Panel    Component Value Date/Time   CHOL 127 07/04/2018 1009   TRIG 129 07/04/2018 1009   HDL 40 07/04/2018 1009   CHOLHDL 3.2 07/04/2018 1009   CHOLHDL 3.7 08/15/2015 0926   VLDL 42 (H) 08/15/2015 0926   LDLCALC 61 07/04/2018 1009   LDLDIRECT 155.7 06/21/2013 1000      Wt Readings from Last 3 Encounters:  11/06/21 294 lb 9.6 oz (133.6 kg)  08/20/21 300 lb (136.1 kg)  08/13/21 (!) 300 lb 12.8 oz (136.4 kg)      Other studies  Reviewed: Additional studies/ records that were reviewed today include: None. Review of the above records demonstrates:  Please see elsewhere in the note.     ASSESSMENT AND PLAN:  CAD:    The patient has no new sypmtoms.  No further cardiovascular testing is indicated.  We will continue with aggressive risk reduction and meds as listed.  ISCHEMIC CARDIOMYOPATHY:    He is tolerating the Cozaar.  Sherryll Burger is going to be cost prohibitive.  He  is tolerating current dose of beta-blocker but really does not want a lot of med titration.  Now that he is compliant with these meds I am going to check an echocardiogram.   DYSLIPIDEMIA:    LDL was 61.  No change in therapy.    OBESITY:      He has gained weight as he has been inactive after shoulder surgery.  We talked about diet and he has specific goals in mind.  We are going to first try to get down to 260.    Current medicines are reviewed at length with the patient today.  The patient does not have concerns regarding medicines.   The following changes have been made: None  Labs/ tests ordered today include:   Orders Placed This Encounter  Procedures   EKG 12-Lead   ECHOCARDIOGRAM COMPLETE     Disposition:   FU with me in 6 months   Signed, Rollene Rotunda, MD  11/06/2021 10:26 AM    [facility]

## 2021-11-06 ENCOUNTER — Encounter: Payer: Self-pay | Admitting: Cardiology

## 2021-11-06 ENCOUNTER — Ambulatory Visit (INDEPENDENT_AMBULATORY_CARE_PROVIDER_SITE_OTHER): Payer: Managed Care, Other (non HMO) | Admitting: Cardiology

## 2021-11-06 VITALS — BP 116/70 | HR 66 | Ht 69.5 in | Wt 294.6 lb

## 2021-11-06 DIAGNOSIS — E785 Hyperlipidemia, unspecified: Secondary | ICD-10-CM

## 2021-11-06 DIAGNOSIS — I2510 Atherosclerotic heart disease of native coronary artery without angina pectoris: Secondary | ICD-10-CM

## 2021-11-06 NOTE — Patient Instructions (Signed)
Medication Instructions:  Your physician recommends that you continue on your current medications as directed. Please refer to the Current Medication list given to you today.  *If you need a refill on your cardiac medications before your next appointment, please call your pharmacy*   Testing/Procedures: Your physician has requested that you have an echocardiogram. Echocardiography is a painless test that uses sound waves to create images of your heart. It provides your doctor with information about the size and shape of your heart and how well your hearts chambers and valves are working. This procedure takes approximately one hour. There are no restrictions for this procedure. This procedure is done at [address].    Follow-Up: At [facility], you and your health needs are our priority.  As part of our continuing mission to provide you with exceptional heart care, we have created designated Provider Care Teams.  These Care Teams include your primary Cardiologist (physician) and Advanced Practice Providers (APPs -  Physician Assistants and Nurse Practitioners) who all work together to provide you with the care you need, when you need it.  We recommend signing up for the patient portal called "MyChart".  Sign up information is provided on this After Visit Summary.  MyChart is used to connect with patients for Virtual Visits (Telemedicine).  Patients are able to view lab/test results, encounter notes, upcoming appointments, etc.  Non-urgent messages can be sent to your provider as well.   To learn more about what you can do with MyChart, go to [email or web address].    Your next appointment:   6 month(s)  The format for your next appointment:   In Person  Provider:   Minus Breeding, MD

## 2021-11-10 ENCOUNTER — Ambulatory Visit (HOSPITAL_BASED_OUTPATIENT_CLINIC_OR_DEPARTMENT_OTHER): Payer: Managed Care, Other (non HMO) | Admitting: Physical Therapy

## 2021-11-10 ENCOUNTER — Encounter (HOSPITAL_BASED_OUTPATIENT_CLINIC_OR_DEPARTMENT_OTHER): Payer: Self-pay | Admitting: Physical Therapy

## 2021-11-10 ENCOUNTER — Other Ambulatory Visit: Payer: Self-pay

## 2021-11-10 DIAGNOSIS — M25612 Stiffness of left shoulder, not elsewhere classified: Secondary | ICD-10-CM

## 2021-11-10 DIAGNOSIS — M25512 Pain in left shoulder: Secondary | ICD-10-CM

## 2021-11-10 DIAGNOSIS — M6281 Muscle weakness (generalized): Secondary | ICD-10-CM

## 2021-11-10 NOTE — Therapy (Signed)
OUTPATIENT PHYSICAL THERAPY TREATMENT  NOTE/PROGRESS NOTE   Patient Name: Brian FOERSTER Sr. MRN: [id number] DOB:1966/02/10, 56 y.o., male Today's Date: 11/10/2021  PCP: Colon Branch, MD   PT End of Session - 11/10/21 0855     Visit Number 17    Number of Visits 32    Date for PT Re-Evaluation 12/17/21    Authorization Type [employer]    Progress Note Due on Visit --   12/06/21   PT Start Time 0850    PT Stop Time 0929    PT Time Calculation (min) 39 min    Activity Tolerance Patient tolerated treatment well;No increased pain    Behavior During Therapy [facility] for tasks assessed/performed                     Past Medical History:  Diagnosis Date   Asthma    Borderline diabetes    CAD (coronary artery disease)    a. s/p anterior STEMI in 09/2011 with aspiration of occluded LAD with DES placed at that time, CTO of RCA noted. EF at 30%   Chest pain    CHF (congestive heart failure) ([place])    Gout    Granulomatosis    chronic granulomatosis (dx at age 29 @ [place])   Ischemic cardiomyopathy    a. EF at 30% by cath in 09/2011. Has refused echos in the past.    MI (myocardial infarction) ([place])    Obesity    SOB (shortness of breath)    Past Surgical History:  Procedure Laterality Date   CHOLECYSTECTOMY     LEFT HEART CATHETERIZATION WITH CORONARY ANGIOGRAM N/A 10/18/2011   Procedure: LEFT HEART CATHETERIZATION WITH CORONARY ANGIOGRAM;  Surgeon: Jettie Booze, MD;  Location: [facility] CATH LAB;  Service: Cardiovascular;  Laterality: N/A;   LUNG BIOPSY  1990s   PERCUTANEOUS CORONARY STENT INTERVENTION (PCI-S) N/A 10/18/2011   Procedure: PERCUTANEOUS CORONARY STENT INTERVENTION (PCI-S);  Surgeon: Jettie Booze, MD;  [facility] C[facility] LAB;  Service: Cardiovascular;  Laterality: N/A;   Patient Active Problem List   Diagnosis Date Noted   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    Subacromial bursitis of left shoulder joint    Left shoulder pain  07/10/2021   PCP NOTES >>>>>>>>>>>>>>>> 05/03/2017   Hemorrhoids 02/14/2012   CAD (coronary artery disease) 12/01/2011   Hyperlipidemia 12/01/2011   Ischemic cardiomyopathy 12/01/2011   Asthma 10/25/2011   Borderline diabetes 10/25/2011   Acute MI, anterior wall ([place]) 10/21/2011   Coronary atherosclerosis of native coronary artery 10/21/2011   Obesity, unspecified 10/21/2011   GOUT 02/24/2010   PCP: Colon Branch, MD   REFERRING PROVIDER: Vanetta Mulders, MD   REFERRING DIAG: [phone] (ICD-10-CM) - Left shoulder pain, unspecified chronicity; M75.102 (ICD-10-CM) - Unspecified rotator cuff tear or rupture of left shoulder   THERAPY DIAG:  Left shoulder pain, unspecified chronicity   Stiffness of left shoulder, not elsewhere classified   Muscle weakness (generalized)     ONSET DATE: 08/20/2021:  Left shoulder arthroscopy with superior capsular reconstruction, subacromial decompression with acromioplasty, and limited shoulder debridement.   SUBJECTIVE:  SUBJECTIVE STATEMENT: -Pt is 11 weeks and 5 days s/p L shoulder superior capsular reconstruction and subacromial decompression with acromioplasty.  Pt denies any adverse effects after prior Rx and did not have soreness.  Pt did some fishing this weekend and didn't perform as much of his HEP.  Pt states the wall walks have helped his ROM.   -FUNCTIONAL IMPROVEMENTS:  Pt able to hang up pants today.  Pt reports improved shoulder hike with UE elevation.  Pt able to raise arm up and apply deodorant.  Improved reaching upward and across body.  Pt able to reach behind head and wash hair.  Improved tolerance with using L UE.   Hanging clothes.  Dressing and bathing. Slight to minimal difficulty with self care activities.   -FUNCTIONAL DEFICITS:  reaching and overhead  activities.  unable to fish and hunt.  He is unable to perform his occupational activities.   PERTINENT HISTORY: L shoulder superior capsular reconstruction (08/20/2021).  MI, CAD, Cadiac surgery with stent placement in 2013, obesity   PAIN:  Are you having pain? No NPRS scale: 0/10 current Pain location: shoulder Pain orientation: Left      PRECAUTIONS: Other: Per Dr. Eddie Dibbles large RCR protocol.  Also superior capsular reconstruction protocol.    OBJECTIVE:   TODAY'S TREATMENT:     Therapeutic Exercise: -Reviewed response to prior Rx, current function, HEP compliance, and pain level.  -Pt performed: Supine serratus punches 2x15  Supine shoulder ABC x 1 rep S/L ER 2x10 reps  Prone extension 2x12 reps Prone horizontal abduction 2x10 reps Standing wall walks in flexion x10 reps  Standing Jobe's flexion 2x10 reps in front of mirror Standing scaption 2x10 reps in front of mirror Standing wand scaption 2x10 reps Standing Flexion AROM approx 6-7 reps   Manual Therapy: -pt received L shoulder gentle distraction with oscillation and grade II inf and post L GH jt mobs to improve stiffness, pain, and normalize arthrokinematics -Pt received L shoulder flexion, Abd, scaption, ER, and IR PROM per protocol and pt tolerance.     PATIENT EDUCATION: Education details:  decrease shoulder hike with standing AAROM.  Used a Geologist, engineering for visual cuing to reduce compensatory shoulder hike with UE elevation.  POC and exercise form.   Person educated: Patient Education method: Explanation, Demonstration, Verbal cues and tactile cues, handout Education comprehension: verbalized understanding, returned demonstration, and verbal cues required     HOME EXERCISE PROGRAM: Access Code: [email or web address] URL: [email or web address] Date: 10/29/2021 Prepared by: Ronny Flurry  Exercises Circular Shoulder Pendulum with Table Support - 3 x daily - 7 x weekly - 2 sets - 10  reps Flexion-Extension Shoulder Pendulum with Table Support - 3 x daily - 7 x weekly - 2 sets - 10 reps Seated Gripping Towel - 2-3 x daily - 7 x weekly - 2-3 sets - 10 reps Seated Shoulder Abduction Towel Slide at Table Top - 3 x daily - 7 x weekly - 1 sets - 10 reps - 5 hold Seated Shoulder Flexion Towel Slide at Table Top - 3 x daily - 7 x weekly - 1 sets - 10 reps - 5 hold Seated Shoulder External Rotation PROM on Table - 3 x daily - 7 x weekly - 1 sets - 10 reps - 5 hold Supine Shoulder Wand flexion AAROM - 2 x daily - 7 x weekly - 2 sets - 10 reps Supine Shoulder External Rotation in 45 Degrees Abduction AAROM with Dowel - 2 x daily - 7 x weekly -  2 sets - 10 reps Supine Shoulder Flexion AROM - 2 x daily - 7 x weekly - 1-2 sets - 10 reps Seated Shoulder External Rotation AAROM with Dowel - 2 x daily - 7 x weekly - 2 sets - 10 reps Single Arm Serratus Punches - 1 x daily - 5-7 x weekly - 2 sets - 10 reps Supine Shoulder Alphabet - 1 x daily - 7 x weekly - 1 reps Prone Shoulder Extension - Single Arm - 1 x daily - 7 x weekly - 3 sets - 10 reps Standing Shoulder Flexion Wall Walk - 2 x daily - 7 x weekly - 1 sets - 10 reps Isometric Shoulder Flexion at Wall - 1 x daily - 5 x weekly - 1-2 sets - 10 reps - 5-6 seconds hold Isometric Shoulder Abduction at Wall - 1 x daily - 5 x weekly - 1-2 sets - 10 reps - 5-6 seconds hold Isometric Shoulder Extension at Wall - 1 x daily - 5 x weekly - 1-2 sets - 10 reps - 5-6 seconds hold Standing Isometric Shoulder External Rotation with Doorway and Towel Roll - 1 x daily - 5 x weekly - 1-2 sets - 10 reps - 5-6 seconds hold Standing Isometric Shoulder Internal Rotation at Doorway - 1 x daily - 5 x weekly - 1-2 sets - 10 reps - 5-6 seconds hold        ASSESSMENT:   CLINICAL IMPRESSION:   Pt is making good progress in all areas.  He is improving functionally as evidenced by subjective reports.  Pt performed exercises well with cuing for correct form and  positioning.  He is progressing appropriately per protocol.  Pt continues to improve with UE elevation.  He continues to have compensatory shoulder hike though is improving.  He demonstrates much improved standing scaption AAROM which is WFL.  Pt performed standing flexion AROM at the end of Rx though was fatigued and stopped the set due to fatigue.  Pt responded well to Rx reporting no pain after Rx though did have soreness.  He should cont to benefit from cont skilled PT services per protocol to address ongoing goals and to assist in restoring PLOF.        Objective impairments include decreased activity tolerance, decreased ROM, decreased strength, hypomobility, impaired flexibility, impaired UE functional use, and pain. These impairments are limiting patient from cleaning, community activity, driving, meal prep, occupation, laundry, yard work, and self care activities and reaching activities . Personal factors including 1 comorbidity: CAD with stent placement  are also affecting patient's functional outcome.     GOALS:     SHORT TERM GOALS:   STG Name Target Date Goal status  1 Pt will be independent and compliant with HEP for improved pain, ROM, strength, and function.  Baseline:  09/14/2021 GOAL MET  2 Pt will demo improved L shoulder PROM in flexion to 90 deg and ER to 25 deg for improved ROM and stiffness.   Baseline:  09/14/2021 GOAL MET  3 Pt will demo improved L shoulder PROM in flexion to 120 deg and ER to 40 deg for improved ROM, mobility, and stiffness. Baseline: 09/28/2021 GOAL MET  4 Pt will wean out of sling without adverse effects Baseline: 10/05/2021 GOAL MET  5 Pt will tolerate the initiation of AAROM without adverse effects for improved mobility and progression of protocol to improve fxn.. Baseline: 10/05/2021 GOAL MET  6 Pt will demo at least 140 deg of supine flexion  AAROM and 45 deg ER for improved mobility and stiffness Baseline: 10/19/2021 GOAL MET  7 Pt will demo at  least 120 deg of flexion and scaption AAROM in standing for improved reaching Baseline: 10/26/2021 95% met  8 Pt will be able to actively elevate L UE > 90 deg without significant shoulder hike or significant pain 11/09/2021 PARTIALLY MET  9 Pt will be able to perform his self care activities with no > than minimal difficulty 11/02/2021 GOAL MET    LONG TERM GOALS:    LTG Name Target Date Goal status  1 Pt will be able to perform his ADLs and IADLs without significant pain or difficulty.  Baseline: 12/17/2021 ONGOING  2 Pt will be able to actively reach into an overhead cabinet without significant difficulty or shoulder hike.  Baseline: 11/23/2021 ONGOING  3 Pt will be able to perform his normal reaching and overhead activities without significant pain or difficulty.  Baseline: 12/17/2021 ONGOING  4 Pt will demo 4/5 to 4+/5 MMT strength t/o L shoulder to assist with returning to functional carrying and lifting and to assist with returning to work activities with expected limitations.  Baseline: 12/17/2021 INITIAL                               PLAN:  FREQUENCY: 2 TIMES PER WEEK  DURATION:  6 WEEKS   PLANNED INTERVENTIONS: Therapeutic exercises, Therapeutic activity, Neuro Muscular re-education, Patient/Family education, Joint mobilization, Electrical stimulation, Cryotherapy, Moist heat, scar mobilization, Taping, and Manual therapy   PLAN FOR NEXT SESSION:  MD messaged PT to use his large RCR repair protocol.  Cont with Dr. Eddie Dibbles RCR protocol and consider superior capsular reconstruction protocol.        Selinda Michaels III PT, DPT 11/10/21 10:12 PM

## 2021-11-12 ENCOUNTER — Encounter (HOSPITAL_BASED_OUTPATIENT_CLINIC_OR_DEPARTMENT_OTHER): Payer: Self-pay | Admitting: Physical Therapy

## 2021-11-12 ENCOUNTER — Ambulatory Visit (HOSPITAL_BASED_OUTPATIENT_CLINIC_OR_DEPARTMENT_OTHER): Payer: Managed Care, Other (non HMO) | Admitting: Physical Therapy

## 2021-11-12 ENCOUNTER — Other Ambulatory Visit: Payer: Self-pay

## 2021-11-12 DIAGNOSIS — M6281 Muscle weakness (generalized): Secondary | ICD-10-CM

## 2021-11-12 DIAGNOSIS — M25512 Pain in left shoulder: Secondary | ICD-10-CM

## 2021-11-12 DIAGNOSIS — M25612 Stiffness of left shoulder, not elsewhere classified: Secondary | ICD-10-CM

## 2021-11-12 NOTE — Therapy (Signed)
OUTPATIENT PHYSICAL THERAPY TREATMENT  NOTE/PROGRESS NOTE   Patient Name: Brian MCLUCKIE Sr. MRN: [id number] DOB:Apr 18, 1966, 56 y.o., male Today's Date: 11/12/2021  PCP: Colon Branch, MD   PT End of Session - 11/12/21 [phone]     Visit Number 18    Number of Visits 32    Date for PT Re-Evaluation 12/17/21    Authorization Type [employer]    PT Start Time 0850    PT Stop Time 0933    PT Time Calculation (min) 43 min    Activity Tolerance Patient tolerated treatment well;No increased pain    Behavior During Therapy [facility] for tasks assessed/performed                      Past Medical History:  Diagnosis Date   Asthma    Borderline diabetes    CAD (coronary artery disease)    a. s/p anterior STEMI in 09/2011 with aspiration of occluded LAD with DES placed at that time, CTO of RCA noted. EF at 30%   Chest pain    CHF (congestive heart failure) ([place])    Gout    Granulomatosis    chronic granulomatosis (dx at age 57 @ [place])   Ischemic cardiomyopathy    a. EF at 30% by cath in 09/2011. Has refused echos in the past.    MI (myocardial infarction) ([place])    Obesity    SOB (shortness of breath)    Past Surgical History:  Procedure Laterality Date   CHOLECYSTECTOMY     LEFT HEART CATHETERIZATION WITH CORONARY ANGIOGRAM N/A 10/18/2011   Procedure: LEFT HEART CATHETERIZATION WITH CORONARY ANGIOGRAM;  Surgeon: Jettie Booze, MD;  Location: [facility] CATH LAB;  Service: Cardiovascular;  Laterality: N/A;   LUNG BIOPSY  1990s   PERCUTANEOUS CORONARY STENT INTERVENTION (PCI-S) N/A 10/18/2011   Procedure: PERCUTANEOUS CORONARY STENT INTERVENTION (PCI-S);  Surgeon: Jettie Booze, MD;  Location: [facility] CATH LAB;  Service: Cardiovascular;  Laterality: N/A;   Patient Active Problem List   Diagnosis Date Noted   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    Subacromial bursitis of left shoulder joint    Left shoulder pain 07/10/2021   PCP NOTES >>>>>>>>>>>>>>>>  05/03/2017   Hemorrhoids 02/14/2012   CAD (coronary artery disease) 12/01/2011   Hyperlipidemia 12/01/2011   Ischemic cardiomyopathy 12/01/2011   Asthma 10/25/2011   Borderline diabetes 10/25/2011   Acute MI, anterior wall ([place]) 10/21/2011   Coronary atherosclerosis of native coronary artery 10/21/2011   Obesity, unspecified 10/21/2011   GOUT 02/24/2010   PCP: Colon Branch, MD   REFERRING PROVIDER: Vanetta Mulders, MD   REFERRING DIAG: [phone] (ICD-10-CM) - Left shoulder pain, unspecified chronicity; M75.102 (ICD-10-CM) - Unspecified rotator cuff tear or rupture of left shoulder   THERAPY DIAG:  Left shoulder pain, unspecified chronicity   Stiffness of left shoulder, not elsewhere classified   Muscle weakness (generalized)     ONSET DATE: 08/20/2021:  Left shoulder arthroscopy with superior capsular reconstruction, subacromial decompression with acromioplasty, and limited shoulder debridement.   SUBJECTIVE:  SUBJECTIVE STATEMENT: -Pt is 12 weeks s/p L shoulder superior capsular reconstruction and subacromial decompression with acromioplasty.  Pt denies any adverse effects after prior Rx and did not have soreness.  Pt did some fishing this weekend and didn't perform as much of his HEP.  Pt states the wall walks have helped his ROM.  Pt reports he feels very sore today which must be from what he did yesterday.  Pt states he has done some work activities and is working today.  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  PT provided education concerning restrictions.  -FUNCTIONAL IMPROVEMENTS:  Pt able to hang up pants today.  Pt reports improved shoulder hike with UE elevation.  Pt able to raise arm up and apply deodorant.  Improved reaching upward and across body.  Pt able to reach behind head and wash hair.   Improved tolerance with using L UE.   Hanging clothes.  Dressing and bathing. Slight to minimal difficulty with self care activities.   -FUNCTIONAL DEFICITS:  reaching and overhead activities.  unable to fish and hunt.  He is unable to perform his occupational activities.   PERTINENT HISTORY: L shoulder superior capsular reconstruction (08/20/2021).  MI, CAD, Cadiac surgery with stent placement in 2013, obesity   PAIN:  Are you having pain? No NPRS scale: 0/10 current Pain location: shoulder Pain orientation: Left      PRECAUTIONS: Other: Per Dr. Eddie Dibbles large RCR protocol.  Also superior capsular reconstruction protocol.    OBJECTIVE:   TODAY'S TREATMENT:    L shoulder AROM:   Flexion:  Standing: 157 deg,  Scaption:  140 deg, Abd:  130 deg, ER:  75 deg, IR:  65.   Therapeutic Exercise: -Reviewed response to prior Rx, current function, HEP compliance, and pain level.  -Pt performed: Supine serratus punches 2x15  Supine shoulder ABC x 1 rep S/L ER 2x10 reps  Prone extension 2x12 reps Prone horizontal abduction 2x12 reps Prone Y 2x10 reps Standing wall walks in flexion x10 reps  Standing Jobe's flexion 2x10 reps in front of mirror Standing scaption 2x10 reps in front of mirror     Manual Therapy: -pt received L shoulder gentle distraction with oscillation and grade II inf and post L GH jt mobs to improve stiffness, pain, and normalize arthrokinematics -Pt received L shoulder flexion, Abd, scaption, ER, and IR PROM per protocol and pt tolerance.     PATIENT EDUCATION: Education details:  post op and protocol restrictions including to not perform any raking or shoveling.  decrease shoulder hike with standing AAROM.  Used a Geologist, engineering for visual cuing to reduce compensatory shoulder hike with UE elevation.  POC and exercise form.   Person educated: Patient Education method: Explanation, Demonstration, Verbal cues and tactile cues Education comprehension: verbalized  understanding, returned demonstration, and verbal cues required     HOME EXERCISE PROGRAM: Access Code: [email or web address] URL: [email or web address] Date: 10/29/2021 Prepared by: Ronny Flurry  Exercises Circular Shoulder Pendulum with Table Support - 3 x daily - 7 x weekly - 2 sets - 10 reps Flexion-Extension Shoulder Pendulum with Table Support - 3 x daily - 7 x weekly - 2 sets - 10 reps Seated Gripping Towel - 2-3 x daily - 7 x weekly - 2-3 sets - 10 reps Seated Shoulder Abduction Towel Slide at Table Top - 3 x daily - 7 x weekly - 1 sets - 10 reps - 5 hold Seated Shoulder Flexion Towel Slide at Table Top - 3 x daily - 7  x weekly - 1 sets - 10 reps - 5 hold Seated Shoulder External Rotation PROM on Table - 3 x daily - 7 x weekly - 1 sets - 10 reps - 5 hold Supine Shoulder Wand flexion AAROM - 2 x daily - 7 x weekly - 2 sets - 10 reps Supine Shoulder External Rotation in 45 Degrees Abduction AAROM with Dowel - 2 x daily - 7 x weekly - 2 sets - 10 reps Supine Shoulder Flexion AROM - 2 x daily - 7 x weekly - 1-2 sets - 10 reps Seated Shoulder External Rotation AAROM with Dowel - 2 x daily - 7 x weekly - 2 sets - 10 reps Single Arm Serratus Punches - 1 x daily - 5-7 x weekly - 2 sets - 10 reps Supine Shoulder Alphabet - 1 x daily - 7 x weekly - 1 reps Prone Shoulder Extension - Single Arm - 1 x daily - 7 x weekly - 3 sets - 10 reps Standing Shoulder Flexion Wall Walk - 2 x daily - 7 x weekly - 1 sets - 10 reps Isometric Shoulder Flexion at Wall - 1 x daily - 5 x weekly - 1-2 sets - 10 reps - 5-6 seconds hold Isometric Shoulder Abduction at Wall - 1 x daily - 5 x weekly - 1-2 sets - 10 reps - 5-6 seconds hold Isometric Shoulder Extension at Wall - 1 x daily - 5 x weekly - 1-2 sets - 10 reps - 5-6 seconds hold Standing Isometric Shoulder External Rotation with Doorway and Towel Roll - 1 x daily - 5 x weekly - 1-2 sets - 10 reps - 5-6 seconds hold Standing Isometric Shoulder Internal  Rotation at Doorway - 1 x daily - 5 x weekly - 1-2 sets - 10 reps - 5-6 seconds hold        ASSESSMENT:   CLINICAL IMPRESSION:   Pt is making good progress in all areas and is progressing appropriately per protocol.  Pt continues to improve with UE elevation including reduced compensatory shoulder hike.  Pt is progressing well with ROM and demonstrates improved L shoulder AROM in each direction.  Pt able to perform prone Y and prone hz abduction though was challenged with both exercises.  Pt responded well to Rx reporting no pain after Rx though did have soreness.  He should cont to benefit from cont skilled PT services per protocol to address ongoing goals and to assist in restoring PLOF.       Objective impairments include decreased activity tolerance, decreased ROM, decreased strength, hypomobility, impaired flexibility, impaired UE functional use, and pain. These impairments are limiting patient from cleaning, community activity, driving, meal prep, occupation, laundry, yard work, and self care activities and reaching activities . Personal factors including 1 comorbidity: CAD with stent placement  are also affecting patient's functional outcome.     GOALS:     SHORT TERM GOALS:   STG Name Target Date Goal status  1 Pt will be independent and compliant with HEP for improved pain, ROM, strength, and function.  Baseline:  09/14/2021 GOAL MET  2 Pt will demo improved L shoulder PROM in flexion to 90 deg and ER to 25 deg for improved ROM and stiffness.   Baseline:  09/14/2021 GOAL MET  3 Pt will demo improved L shoulder PROM in flexion to 120 deg and ER to 40 deg for improved ROM, mobility, and stiffness. Baseline: 09/28/2021 GOAL MET  4 Pt will wean out of sling  without adverse effects Baseline: 10/05/2021 GOAL MET  5 Pt will tolerate the initiation of AAROM without adverse effects for improved mobility and progression of protocol to improve fxn.. Baseline: 10/05/2021 GOAL MET  6 Pt  will demo at least 140 deg of supine flexion AAROM and 45 deg ER for improved mobility and stiffness Baseline: 10/19/2021 GOAL MET  7 Pt will demo at least 120 deg of flexion and scaption AAROM in standing for improved reaching Baseline: 10/26/2021 GOAL MET  8 Pt will be able to actively elevate L UE > 90 deg without significant shoulder hike or significant pain 11/09/2021 PARTIALLY MET  9 Pt will be able to perform his self care activities with no > than minimal difficulty 11/02/2021 GOAL MET    LONG TERM GOALS:    LTG Name Target Date Goal status  1 Pt will be able to perform his ADLs and IADLs without significant pain or difficulty.  Baseline: 12/17/2021 ONGOING  2 Pt will be able to actively reach into an overhead cabinet without significant difficulty or shoulder hike.  Baseline: 11/23/2021 ONGOING  3 Pt will be able to perform his normal reaching and overhead activities without significant pain or difficulty.  Baseline: 12/17/2021 ONGOING  4 Pt will demo 4/5 to 4+/5 MMT strength t/o L shoulder to assist with returning to functional carrying and lifting and to assist with returning to work activities with expected limitations.  Baseline: 12/17/2021 INITIAL                               PLAN:  FREQUENCY: 2 TIMES PER WEEK  DURATION:  6 WEEKS   PLANNED INTERVENTIONS: Therapeutic exercises, Therapeutic activity, Neuro Muscular re-education, Patient/Family education, Joint mobilization, Electrical stimulation, Cryotherapy, Moist heat, scar mobilization, Taping, and Manual therapy   PLAN FOR NEXT SESSION:  MD messaged PT to use his large RCR repair protocol.  Cont with Dr. Eddie Dibbles RCR protocol and consider superior capsular reconstruction protocol.        Selinda Michaels III PT, DPT 11/12/21 4:28 PM

## 2021-11-13 ENCOUNTER — Ambulatory Visit (INDEPENDENT_AMBULATORY_CARE_PROVIDER_SITE_OTHER): Payer: Managed Care, Other (non HMO) | Admitting: Orthopaedic Surgery

## 2021-11-13 DIAGNOSIS — M12812 Other specific arthropathies, not elsewhere classified, left shoulder: Secondary | ICD-10-CM

## 2021-11-13 DIAGNOSIS — M75102 Unspecified rotator cuff tear or rupture of left shoulder, not specified as traumatic: Secondary | ICD-10-CM

## 2021-11-13 NOTE — Progress Notes (Signed)
Post Operative Evaluation    Procedure/Date of Surgery: 08/20/21 left superior capsular reconstruction  Interval History:    Presents today 43-month status post left superior capsular reconstruction.  Overall he is doing much better.  He has been able to hang up a pair of jeans for the first time.  Has been working on probably range of motion with physical therapy has not progressed to strengthening.   PMH/PSH/Family History/Social History/Meds/Allergies:    Past Medical History:  Diagnosis Date   Asthma    Borderline diabetes    CAD (coronary artery disease)    a. s/p anterior STEMI in 09/2011 with aspiration of occluded LAD with DES placed at that time, CTO of RCA noted. EF at 30%   Chest pain    CHF (congestive heart failure) ([facility])    Gout    Granulomatosis    chronic granulomatosis (dx at age 73 @ [facility])   Ischemic cardiomyopathy    a. EF at 30% by cath in 09/2011. Has refused echos in the past.    MI (myocardial infarction) ([facility])    Obesity    SOB (shortness of breath)    Past Surgical History:  Procedure Laterality Date   CHOLECYSTECTOMY     LEFT HEART CATHETERIZATION WITH CORONARY ANGIOGRAM N/A 10/18/2011   Procedure: LEFT HEART CATHETERIZATION WITH CORONARY ANGIOGRAM;  Surgeon: Corky Crafts, MD;  Location: [facility] CATH LAB;  Service: Cardiovascular;  Laterality: N/A;   LUNG BIOPSY  1990s   PERCUTANEOUS CORONARY STENT INTERVENTION (PCI-S) N/A 10/18/2011   Procedure: PERCUTANEOUS CORONARY STENT INTERVENTION (PCI-S);  Surgeon: Corky Crafts, MD;  Location: [facility] CATH LAB;  Service: Cardiovascular;  Laterality: N/A;   Social History   Socioeconomic History   Marital status: Married    Spouse name: Not on file   Number of children: 1   Years of education: Not on file   Highest education level: Not on file  Occupational History   Occupation: Flooring  Tobacco Use   Smoking status: Former    Types: Cigarettes    Quit  date: 10/17/1989    Years since quitting: 32.0   Smokeless tobacco: Never  Vaping Use   Vaping Use: Never used  Substance and Sexual Activity   Alcohol use: Not Currently    Alcohol/week: 1.0 standard drink    Types: 1 Cans of beer per week   Drug use: Yes    Frequency: 7.0 times per week    Types: Marijuana    Comment: daily   Sexual activity: Not on file  Other Topics Concern   Not on file  Social History Narrative            Social Determinants of Health   Financial Resource Strain: Not on file  Food Insecurity: Not on file  Transportation Needs: Not on file  Physical Activity: Not on file  Stress: Not on file  Social Connections: Not on file   Family History  Problem Relation Age of Onset   Coronary artery disease Father 83       CABG   Heart attack Father    Healthy Mother    Colon cancer Neg Hx    Prostate cancer Neg Hx    Allergies  Allergen Reactions   Morphine Nausea And Vomiting   Current Outpatient Medications  Medication  Sig Dispense Refill   acetaminophen (TYLENOL) 500 MG tablet Take 500 mg by mouth every 6 (six) hours as needed for moderate pain.     albuterol (VENTOLIN HFA) 108 (90 Base) MCG/ACT inhaler Inhale 1-2 puffs into the lungs every 6 (six) hours as needed for wheezing or shortness of breath. 18 g 2   aspirin EC 325 MG tablet Take 1 tablet (325 mg total) by mouth daily. 30 tablet 0   furosemide (LASIX) 20 MG tablet TAKE 1 TABLET BY MOUTH EVERY DAY 90 tablet 2   losartan (COZAAR) 100 MG tablet Take 1 tablet (100 mg total) by mouth daily. 90 tablet 3   metoprolol succinate (TOPROL-XL) 100 MG 24 hr tablet Take 1 tablet (100 mg total) by mouth daily. Take with or immediately following a meal. 90 tablet 2   nitroGLYCERIN (NITROSTAT) 0.4 MG SL tablet Place 1 tablet (0.4 mg total) under the tongue every 5 (five) minutes as needed for chest pain. 25 tablet 3   rosuvastatin (CRESTOR) 20 MG tablet TAKE 1 TABLET BY MOUTH EVERY DAY 90 tablet 3   No  current facility-administered medications for this visit.   No results found.  Review of Systems:   A ROS was performed including pertinent positives and negatives as documented in the HPI.   Musculoskeletal Exam:    There were no vitals taken for this visit.  Portals are healed.  Is able to flex and extend at the elbow.  2+ radial pulse.  Fingers warm and well-perfused.  Sensation is intact in all distributions of the left arm  Active forward elevation is to 160 degrees and he is able to maintain this position.  External rotation at the side is to 45 degrees, internal rotation is to L1  Imaging:    None  I personally reviewed and interpreted the radiographs.   Assessment:   56 year old male 12 weeks status post left superior capsular reconstruction.  He is doing extremely well at today's visit.  I would like him to begin band work and strengthening at this time.  He can specifically work on his posterior chain and periscapular strengthening as well.  Plan :    -Return to clinic in 12 weeks   I personally saw and evaluated the patient, and participated in the management and treatment plan.  Huel Cote, MD Attending Physician, Orthopedic Surgery  This document was dictated using Dragon voice recognition software. A reasonable attempt at proof reading has been made to minimize errors.

## 2021-11-17 ENCOUNTER — Other Ambulatory Visit: Payer: Self-pay

## 2021-11-17 ENCOUNTER — Ambulatory Visit (HOSPITAL_BASED_OUTPATIENT_CLINIC_OR_DEPARTMENT_OTHER): Payer: Managed Care, Other (non HMO) | Admitting: Physical Therapy

## 2021-11-17 ENCOUNTER — Encounter (HOSPITAL_BASED_OUTPATIENT_CLINIC_OR_DEPARTMENT_OTHER): Payer: Self-pay | Admitting: Physical Therapy

## 2021-11-17 DIAGNOSIS — M25512 Pain in left shoulder: Secondary | ICD-10-CM

## 2021-11-17 DIAGNOSIS — M6281 Muscle weakness (generalized): Secondary | ICD-10-CM

## 2021-11-17 DIAGNOSIS — M25612 Stiffness of left shoulder, not elsewhere classified: Secondary | ICD-10-CM

## 2021-11-17 NOTE — Therapy (Signed)
OUTPATIENT PHYSICAL THERAPY TREATMENT  NOTE/PROGRESS NOTE   Patient Name: Brian LEMOINE Sr. MRN: [id number] DOB:04/17/1966, 56 y.o., male Today's Date: 11/17/2021  PCP: Colon Branch, MD   PT End of Session - 11/17/21 0919     Visit Number 19    Number of Visits 32    Date for PT Re-Evaluation 12/17/21    Authorization Type [employer]    Progress Note Due on Visit --   12/06/2021   PT Start Time 0851    PT Stop Time 0933    PT Time Calculation (min) 42 min    Activity Tolerance Patient tolerated treatment well;No increased pain    Behavior During Therapy [facility] for tasks assessed/performed                       Past Medical History:  Diagnosis Date   Asthma    Borderline diabetes    CAD (coronary artery disease)    a. s/p anterior STEMI in 09/2011 with aspiration of occluded LAD with DES placed at that time, CTO of RCA noted. EF at 30%   Chest pain    CHF (congestive heart failure) ([place])    Gout    Granulomatosis    chronic granulomatosis (dx at age 68 @ [place])   Ischemic cardiomyopathy    a. EF at 30% by cath in 09/2011. Has refused echos in the past.    MI (myocardial infarction) ([place])    Obesity    SOB (shortness of breath)    Past Surgical History:  Procedure Laterality Date   CHOLECYSTECTOMY     LEFT HEART CATHETERIZATION WITH CORONARY ANGIOGRAM N/A 10/18/2011   Procedure: LEFT HEART CATHETERIZATION WITH CORONARY ANGIOGRAM;  Surgeon: Jettie Booze, MD;  Location: [facility] CATH LAB;  Service: Cardiovascular;  Laterality: N/A;   LUNG BIOPSY  1990s   PERCUTANEOUS CORONARY STENT INTERVENTION (PCI-S) N/A 10/18/2011   Procedure: PERCUTANEOUS CORONARY STENT INTERVENTION (PCI-S);  Surgeon: Jettie Booze, MD;  Location: [facility] CATH LAB;  Service: Cardiovascular;  Laterality: N/A;   Patient Active Problem List   Diagnosis Date Noted   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    Subacromial bursitis of left shoulder joint    Left shoulder  pain 07/10/2021   PCP NOTES >>>>>>>>>>>>>>>> 05/03/2017   Hemorrhoids 02/14/2012   CAD (coronary artery disease) 12/01/2011   Hyperlipidemia 12/01/2011   Ischemic cardiomyopathy 12/01/2011   Asthma 10/25/2011   Borderline diabetes 10/25/2011   Acute MI, anterior wall ([place]) 10/21/2011   Coronary atherosclerosis of native coronary artery 10/21/2011   Obesity, unspecified 10/21/2011   GOUT 02/24/2010   PCP: Colon Branch, MD   REFERRING PROVIDER: Vanetta Mulders, MD   REFERRING DIAG: [phone] (ICD-10-CM) - Left shoulder pain, unspecified chronicity; M75.102 (ICD-10-CM) - Unspecified rotator cuff tear or rupture of left shoulder   THERAPY DIAG:  Left shoulder pain, unspecified chronicity   Stiffness of left shoulder, not elsewhere classified   Muscle weakness (generalized)     ONSET DATE: 08/20/2021:  Left shoulder arthroscopy with superior capsular reconstruction, subacromial decompression with acromioplasty, and limited shoulder debridement.   SUBJECTIVE:  SUBJECTIVE STATEMENT: -Pt is 12 weeks and 5 days s/p L shoulder superior capsular reconstruction and subacromial decompression with acromioplasty.    -Pt states he did some fishing over the weekend.  Pt reports he was sore and had difficulty with reaching top of his head after fishing.  Pt saw MD on Friday and Pt reports MD is allowing pt to progress to strengthening in PT.  MD note indicated pt to begin band work and strengthening at this time.  He can specifically work on his posterior chain and periscapular strengthening as well.  -HEP COMPLIANCE:  Pt reports he hasn't been compliant with HEP though has done some of his stretches. -RESPONSE TO PRIOR RX:  Pt denies any adverse effects after prior Rx.  Pt reports no soreness.  -FUNCTIONAL IMPROVEMENTS:   Pt able to hang up pants today.  Pt reports improved shoulder hike with UE elevation.  Pt able to raise arm up and apply deodorant.  Improved reaching upward and across body.  Pt able to reach behind head and wash hair.  Improved tolerance with using L UE.   Hanging clothes.  Dressing and bathing. Slight to minimal difficulty with self care activities.   -FUNCTIONAL DEFICITS:  reaching and overhead activities.  unable to fish and hunt.  He is unable to perform his occupational activities.   PERTINENT HISTORY: L shoulder superior capsular reconstruction (08/20/2021).  MI, CAD, Cadiac surgery with stent placement in 2013, obesity   PAIN:  Are you having pain? No NPRS scale: 0/10 current Pain location: shoulder Pain orientation: Left      PRECAUTIONS: Other: Per Dr. Eddie Dibbles large RCR protocol.  Also superior capsular reconstruction protocol.    OBJECTIVE:   TODAY'S TREATMENT:    Therapeutic Exercise: -Reviewed response to prior Rx, current function, HEP compliance, and pain level.  -Pt performed: Supine serratus punches x15 with 1# and 2x10 with 2#  Supine shoulder ABC x 1 rep with 1# Prone extension with 1# x 15 reps and 2# x 10 reps Prone row with 2# 2x15 reps Prone horizontal abduction 2x12 reps Prone Y 2x10 reps Supine rhythmic stab's at 90 deg 2x30 sec Rows with retraction with YTB x 15 reps and with RTB x 10 reps Standing wall walks in flexion x10 reps      Manual Therapy: -pt received L shoulder gentle distraction with oscillation and grade II inf and post L GH jt mobs to improve stiffness, pain, and normalize arthrokinematics -Pt received L shoulder flexion, Abd, scaption, ER, and IR PROM per protocol and pt tolerance.     PATIENT EDUCATION: Education details:  encouraged pt to be compliant with HEP.  POC and exercise form.   Person educated: Patient Education method: Explanation, Demonstration, Verbal cues and tactile cues Education comprehension: verbalized  understanding, returned demonstration, and verbal cues required     HOME EXERCISE PROGRAM: Access Code: [email or web address] URL: [email or web address] Date: 10/29/2021 Prepared by: Ronny Flurry  Exercises Circular Shoulder Pendulum with Table Support - 3 x daily - 7 x weekly - 2 sets - 10 reps Flexion-Extension Shoulder Pendulum with Table Support - 3 x daily - 7 x weekly - 2 sets - 10 reps Seated Gripping Towel - 2-3 x daily - 7 x weekly - 2-3 sets - 10 reps Seated Shoulder Abduction Towel Slide at Table Top - 3 x daily - 7 x weekly - 1 sets - 10 reps - 5 hold Seated Shoulder Flexion Towel Slide at Table Top - 3 x daily - 7  x weekly - 1 sets - 10 reps - 5 hold Seated Shoulder External Rotation PROM on Table - 3 x daily - 7 x weekly - 1 sets - 10 reps - 5 hold Supine Shoulder Wand flexion AAROM - 2 x daily - 7 x weekly - 2 sets - 10 reps Supine Shoulder External Rotation in 45 Degrees Abduction AAROM with Dowel - 2 x daily - 7 x weekly - 2 sets - 10 reps Supine Shoulder Flexion AROM - 2 x daily - 7 x weekly - 1-2 sets - 10 reps Seated Shoulder External Rotation AAROM with Dowel - 2 x daily - 7 x weekly - 2 sets - 10 reps Single Arm Serratus Punches - 1 x daily - 5-7 x weekly - 2 sets - 10 reps Supine Shoulder Alphabet - 1 x daily - 7 x weekly - 1 reps Prone Shoulder Extension - Single Arm - 1 x daily - 7 x weekly - 3 sets - 10 reps Standing Shoulder Flexion Wall Walk - 2 x daily - 7 x weekly - 1 sets - 10 reps Isometric Shoulder Flexion at Wall - 1 x daily - 5 x weekly - 1-2 sets - 10 reps - 5-6 seconds hold Isometric Shoulder Abduction at Wall - 1 x daily - 5 x weekly - 1-2 sets - 10 reps - 5-6 seconds hold Isometric Shoulder Extension at Wall - 1 x daily - 5 x weekly - 1-2 sets - 10 reps - 5-6 seconds hold Standing Isometric Shoulder External Rotation with Doorway and Towel Roll - 1 x daily - 5 x weekly - 1-2 sets - 10 reps - 5-6 seconds hold Standing Isometric Shoulder Internal  Rotation at Doorway - 1 x daily - 5 x weekly - 1-2 sets - 10 reps - 5-6 seconds hold        ASSESSMENT:   CLINICAL IMPRESSION:   Pt is making good progress in all areas and is progressing appropriately per protocol.  Pt is not compliant with HEP though is performing some of the stretching.  Pt saw MD who progressed pt to strengthening.  PT progressed ther ex today with adding light resistance to select scapular exercises.  Pt tolerate light resistance well and performed exercises well without c/o's.  Pt responded well to Rx reporting no pain after Rx.  He should cont to benefit from cont skilled PT services per protocol to address ongoing goals and to assist in restoring PLOF.       Objective impairments include decreased activity tolerance, decreased ROM, decreased strength, hypomobility, impaired flexibility, impaired UE functional use, and pain. These impairments are limiting patient from cleaning, community activity, driving, meal prep, occupation, laundry, yard work, and self care activities and reaching activities . Personal factors including 1 comorbidity: CAD with stent placement  are also affecting patient's functional outcome.     GOALS:     SHORT TERM GOALS:   STG Name Target Date Goal status  1 Pt will be independent and compliant with HEP for improved pain, ROM, strength, and function.  Baseline:  09/14/2021 GOAL MET  2 Pt will demo improved L shoulder PROM in flexion to 90 deg and ER to 25 deg for improved ROM and stiffness.   Baseline:  09/14/2021 GOAL MET  3 Pt will demo improved L shoulder PROM in flexion to 120 deg and ER to 40 deg for improved ROM, mobility, and stiffness. Baseline: 09/28/2021 GOAL MET  4 Pt will wean out of sling without  adverse effects Baseline: 10/05/2021 GOAL MET  5 Pt will tolerate the initiation of AAROM without adverse effects for improved mobility and progression of protocol to improve fxn.. Baseline: 10/05/2021 GOAL MET  6 Pt will demo at  least 140 deg of supine flexion AAROM and 45 deg ER for improved mobility and stiffness Baseline: 10/19/2021 GOAL MET  7 Pt will demo at least 120 deg of flexion and scaption AAROM in standing for improved reaching Baseline: 10/26/2021 GOAL MET  8 Pt will be able to actively elevate L UE > 90 deg without significant shoulder hike or significant pain 11/09/2021 PARTIALLY MET  9 Pt will be able to perform his self care activities with no > than minimal difficulty 11/02/2021 GOAL MET    LONG TERM GOALS:    LTG Name Target Date Goal status  1 Pt will be able to perform his ADLs and IADLs without significant pain or difficulty.  Baseline: 12/17/2021 ONGOING  2 Pt will be able to actively reach into an overhead cabinet without significant difficulty or shoulder hike.  Baseline: 11/23/2021 ONGOING  3 Pt will be able to perform his normal reaching and overhead activities without significant pain or difficulty.  Baseline: 12/17/2021 ONGOING  4 Pt will demo 4/5 to 4+/5 MMT strength t/o L shoulder to assist with returning to functional carrying and lifting and to assist with returning to work activities with expected limitations.  Baseline: 12/17/2021 INITIAL                               PLAN:  FREQUENCY: 2 TIMES PER WEEK  DURATION:  6 WEEKS   PLANNED INTERVENTIONS: Therapeutic exercises, Therapeutic activity, Neuro Muscular re-education, Patient/Family education, Joint mobilization, Electrical stimulation, Cryotherapy, Moist heat, scar mobilization, Taping, and Manual therapy   PLAN FOR NEXT SESSION:  MD messaged PT to use his large RCR repair protocol.  Cont with Dr. Eddie Dibbles RCR protocol and consider superior capsular reconstruction protocol.        Selinda Michaels III PT, DPT 11/17/21 9:01 PM

## 2021-11-19 ENCOUNTER — Encounter (HOSPITAL_BASED_OUTPATIENT_CLINIC_OR_DEPARTMENT_OTHER): Payer: Self-pay | Admitting: Physical Therapy

## 2021-11-19 ENCOUNTER — Other Ambulatory Visit: Payer: Self-pay

## 2021-11-19 ENCOUNTER — Ambulatory Visit (HOSPITAL_BASED_OUTPATIENT_CLINIC_OR_DEPARTMENT_OTHER): Payer: Commercial Managed Care - HMO | Attending: Orthopaedic Surgery | Admitting: Physical Therapy

## 2021-11-19 DIAGNOSIS — M25512 Pain in left shoulder: Secondary | ICD-10-CM | POA: Insufficient documentation

## 2021-11-19 DIAGNOSIS — M25612 Stiffness of left shoulder, not elsewhere classified: Secondary | ICD-10-CM | POA: Insufficient documentation

## 2021-11-19 DIAGNOSIS — M6281 Muscle weakness (generalized): Secondary | ICD-10-CM | POA: Diagnosis present

## 2021-11-19 NOTE — Therapy (Addendum)
OUTPATIENT PHYSICAL THERAPY TREATMENT  NOTE   Patient Name: Brian SHANKS Sr. MRN: [id number] DOB:1966/03/17, 56 y.o., male Today's Date: 11/19/2021  PCP: Colon Branch, MD               Past Medical History:  Diagnosis Date   Asthma    Borderline diabetes    CAD (coronary artery disease)    a. s/p anterior STEMI in 09/2011 with aspiration of occluded LAD with DES placed at that time, CTO of RCA noted. EF at 30%   Chest pain    CHF (congestive heart failure) ([place])    Gout    Granulomatosis    chronic granulomatosis (dx at age 74 @ [place])   Ischemic cardiomyopathy    a. EF at 30% by cath in 09/2011. Has refused echos in the past.    MI (myocardial infarction) ([place])    Obesity    SOB (shortness of breath)    Past Surgical History:  Procedure Laterality Date   CHOLECYSTECTOMY     LEFT HEART CATHETERIZATION WITH CORONARY ANGIOGRAM N/A 10/18/2011   Procedure: LEFT HEART CATHETERIZATION WITH CORONARY ANGIOGRAM;  Surgeon: Jettie Booze, MD;  Location: [facility] CATH LAB;  Service: Cardiovascular;  Laterality: N/A;   LUNG BIOPSY  1990s   PERCUTANEOUS CORONARY STENT INTERVENTION (PCI-S) N/A 10/18/2011   Procedure: PERCUTANEOUS CORONARY STENT INTERVENTION (PCI-S);  Surgeon: Jettie Booze, MD;  Location: [facility] CATH LAB;  Service: Cardiovascular;  Laterality: N/A;   Patient Active Problem List   Diagnosis Date Noted   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    Subacromial bursitis of left shoulder joint    Left shoulder pain 07/10/2021   PCP NOTES >>>>>>>>>>>>>>>> 05/03/2017   Hemorrhoids 02/14/2012   CAD (coronary artery disease) 12/01/2011   Hyperlipidemia 12/01/2011   Ischemic cardiomyopathy 12/01/2011   Asthma 10/25/2011   Borderline diabetes 10/25/2011   Acute MI, anterior wall ([place]) 10/21/2011   Coronary atherosclerosis of native coronary artery 10/21/2011   Obesity, unspecified 10/21/2011   GOUT 02/24/2010   PCP: Colon Branch, MD   REFERRING  PROVIDER: Vanetta Mulders, MD   REFERRING DIAG: [phone] (ICD-10-CM) - Left shoulder pain, unspecified chronicity; M75.102 (ICD-10-CM) - Unspecified rotator cuff tear or rupture of left shoulder   THERAPY DIAG:  Left shoulder pain, unspecified chronicity   Stiffness of left shoulder, not elsewhere classified   Muscle weakness (generalized)     ONSET DATE: 08/20/2021:  Left shoulder arthroscopy with superior capsular reconstruction, subacromial decompression with acromioplasty, and limited shoulder debridement.   SUBJECTIVE:  SUBJECTIVE STATEMENT: -Pt is 13 weeks and 1 day s/p L shoulder superior capsular reconstruction and subacromial decompression with acromioplasty.    -Pt states Brian Gross did some fishing over the weekend.  Pt reports Brian Gross was sore and had difficulty with reaching top of his head after fishing.  Pt saw MD on Friday and Pt reports MD is allowing pt to progress to strengthening in PT.  MD note indicated pt to begin band work and strengthening at this time.  Brian Gross can specifically work on his posterior chain and periscapular strengthening as well.  -HEP COMPLIANCE:  Pt reports Brian Gross hasn't been compliant with HEP though has done some of his stretches. -RESPONSE TO PRIOR RX:  Pt denies any adverse effects after prior Rx.  Pt reports no soreness.  -FUNCTIONAL IMPROVEMENTS:  "It's getting better."  Pt reports improved shoulder hike with UE elevation.  Pt able to raise arm up and apply deodorant.  Improved reaching upward and across body.  Pt able to reach behind head and wash hair.  Improved tolerance with using L UE.   Hanging clothes.  Dressing and bathing. Slight to minimal difficulty with self care activities.   -FUNCTIONAL DEFICITS:  reaching and overhead activities.  unable to fish and hunt.  Brian Gross is unable to  perform his occupational activities.   PERTINENT HISTORY: L shoulder superior capsular reconstruction (08/20/2021).  MI, CAD, Cadiac surgery with stent placement in 2013, obesity   PAIN:  Are you having pain? No NPRS scale: 0/10 current Pain location: shoulder Pain orientation: Left      PRECAUTIONS: Other: Per Dr. Eddie Dibbles large RCR protocol.  Also superior capsular reconstruction protocol.    OBJECTIVE:   TODAY'S TREATMENT:    Therapeutic Exercise: -Reviewed response to prior Rx, current function, HEP compliance, and pain level.  -Pt performed: Supine serratus punches x15 with 2# and x 15 reps with 3#  Supine shoulder ABC x 1 rep with 2# Prone extension with 2# 2 x 15 reps Prone row with 2# 2x15 reps Prone horizontal abduction x10 reps each with 0# and with 1# Supine rhythmic stab's at 90 deg and 60 deg 2x30 sec each Rows with retraction RTB 2 x 15 reps Shoulder extension with RTB 2x10 reps Standing wall walks in flexion x10 reps      Manual Therapy: -pt received L shoulder gentle distraction with oscillation and grade II inf and post L GH jt mobs to improve stiffness, pain, and normalize arthrokinematics -Pt received L shoulder flexion, Abd, scaption, ER, and IR PROM per protocol and pt tolerance.     PATIENT EDUCATION: Education details:  encouraged pt to be compliant with HEP.  POC and exercise form.   Person educated: Patient Education method: Explanation, Demonstration, Verbal cues and tactile cues Education comprehension: verbalized understanding, returned demonstration, and verbal cues required     HOME EXERCISE PROGRAM: Access Code: [email or web address] URL: [email or web address] Date: 10/29/2021 Prepared by: Ronny Flurry  Exercises Circular Shoulder Pendulum with Table Support - 3 x daily - 7 x weekly - 2 sets - 10 reps Flexion-Extension Shoulder Pendulum with Table Support - 3 x daily - 7 x weekly - 2 sets - 10 reps Seated Gripping Towel -  2-3 x daily - 7 x weekly - 2-3 sets - 10 reps Seated Shoulder Abduction Towel Slide at Table Top - 3 x daily - 7 x weekly - 1 sets - 10 reps - 5 hold Seated Shoulder Flexion Towel Slide at Table Top - 3 x daily - 7  x weekly - 1 sets - 10 reps - 5 hold Seated Shoulder External Rotation PROM on Table - 3 x daily - 7 x weekly - 1 sets - 10 reps - 5 hold Supine Shoulder Wand flexion AAROM - 2 x daily - 7 x weekly - 2 sets - 10 reps Supine Shoulder External Rotation in 45 Degrees Abduction AAROM with Dowel - 2 x daily - 7 x weekly - 2 sets - 10 reps Supine Shoulder Flexion AROM - 2 x daily - 7 x weekly - 1-2 sets - 10 reps Seated Shoulder External Rotation AAROM with Dowel - 2 x daily - 7 x weekly - 2 sets - 10 reps Single Arm Serratus Punches - 1 x daily - 5-7 x weekly - 2 sets - 10 reps Supine Shoulder Alphabet - 1 x daily - 7 x weekly - 1 reps Prone Shoulder Extension - Single Arm - 1 x daily - 7 x weekly - 3 sets - 10 reps Standing Shoulder Flexion Wall Walk - 2 x daily - 7 x weekly - 1 sets - 10 reps Isometric Shoulder Flexion at Wall - 1 x daily - 5 x weekly - 1-2 sets - 10 reps - 5-6 seconds hold Isometric Shoulder Abduction at Wall - 1 x daily - 5 x weekly - 1-2 sets - 10 reps - 5-6 seconds hold Isometric Shoulder Extension at Wall - 1 x daily - 5 x weekly - 1-2 sets - 10 reps - 5-6 seconds hold Standing Isometric Shoulder External Rotation with Doorway and Towel Roll - 1 x daily - 5 x weekly - 1-2 sets - 10 reps - 5-6 seconds hold Standing Isometric Shoulder Internal Rotation at Doorway - 1 x daily - 5 x weekly - 1-2 sets - 10 reps - 5-6 seconds hold        ASSESSMENT:   CLINICAL IMPRESSION:   Pt is making good progress in all areas and is progressing appropriately per protocol.  Pt is not compliant with HEP though is performing some of the stretching.  PT progressed ther ex today with increasing resistance for selected scapular exercises.  Pt tolerated light resistance well and  performed exercises well without c/o's.  Pt responded well to Rx reporting no pain after Rx.  Brian Gross should cont to benefit from cont skilled PT services per protocol to address ongoing goals and to assist in restoring PLOF.        Objective impairments include decreased activity tolerance, decreased ROM, decreased strength, hypomobility, impaired flexibility, impaired UE functional use, and pain. These impairments are limiting patient from cleaning, community activity, driving, meal prep, occupation, laundry, yard work, and self care activities and reaching activities . Personal factors including 1 comorbidity: CAD with stent placement  are also affecting patient's functional outcome.     GOALS:     SHORT TERM GOALS:   STG Name Target Date Goal status  1 Pt will be independent and compliant with HEP for improved pain, ROM, strength, and function.  Baseline:  09/14/2021 GOAL MET  2 Pt will demo improved L shoulder PROM in flexion to 90 deg and ER to 25 deg for improved ROM and stiffness.   Baseline:  09/14/2021 GOAL MET  3 Pt will demo improved L shoulder PROM in flexion to 120 deg and ER to 40 deg for improved ROM, mobility, and stiffness. Baseline: 09/28/2021 GOAL MET  4 Pt will wean out of sling without adverse effects Baseline: 10/05/2021 GOAL MET  5 Pt  will tolerate the initiation of AAROM without adverse effects for improved mobility and progression of protocol to improve fxn.. Baseline: 10/05/2021 GOAL MET  6 Pt will demo at least 140 deg of supine flexion AAROM and 45 deg ER for improved mobility and stiffness Baseline: 10/19/2021 GOAL MET  7 Pt will demo at least 120 deg of flexion and scaption AAROM in standing for improved reaching Baseline: 10/26/2021 GOAL MET  8 Pt will be able to actively elevate L UE > 90 deg without significant shoulder hike or significant pain 11/09/2021 PARTIALLY MET  9 Pt will be able to perform his self care activities with no > than minimal difficulty  11/02/2021 GOAL MET    LONG TERM GOALS:    LTG Name Target Date Goal status  1 Pt will be able to perform his ADLs and IADLs without significant pain or difficulty.  Baseline: 12/17/2021 ONGOING  2 Pt will be able to actively reach into an overhead cabinet without significant difficulty or shoulder hike.  Baseline: 11/23/2021 ONGOING  3 Pt will be able to perform his normal reaching and overhead activities without significant pain or difficulty.  Baseline: 12/17/2021 ONGOING  4 Pt will demo 4/5 to 4+/5 MMT strength t/o L shoulder to assist with returning to functional carrying and lifting and to assist with returning to work activities with expected limitations.  Baseline: 12/17/2021 INITIAL                               PLAN:  PLANNED INTERVENTIONS: Therapeutic exercises, Therapeutic activity, Neuro Muscular re-education, Patient/Family education, Joint mobilization, Electrical stimulation, Cryotherapy, Moist heat, scar mobilization, Taping, and Manual therapy   PLAN FOR NEXT SESSION:  MD messaged PT to use his large RCR repair protocol.  Cont with Dr. Eddie Dibbles RCR protocol and consider superior capsular reconstruction protocol.  Cont with progressing strengthening per MD orders and MD protocol.    Selinda Michaels III PT, DPT 11/19/21 11:17 PM  PHYSICAL THERAPY DISCHARGE SUMMARY  Visits from Start of Care: 20  Current functional level related to goals / functional outcomes: Pt made good progress in PT including good progress toward goals.  For detailed progress and objective findings, please refer to progress note on 11/12/2021.  Remaining deficits: Se above    Education / Equipment: Pt has a HEP.   Pt was seen in PT from 08/24/2021 - 11/19/2021.  Brian Gross was going out of town and planning to reschedule when Brian Gross returned.  Pt reports his visits were becoming costly and did not schedule further PT.  Pt stopped by PT after seeing MD and states Brian Gross is fine to be discharged.  Pt states Brian Gross is very  pleased with progress and states his shoulder is doing well.  Pt is very appreciative of skilled PT services.  Patient agrees to discharge.  Pt will be considered discharged at this time.

## 2021-11-25 ENCOUNTER — Encounter (HOSPITAL_BASED_OUTPATIENT_CLINIC_OR_DEPARTMENT_OTHER): Payer: Self-pay | Admitting: Physical Therapy

## 2021-12-02 ENCOUNTER — Ambulatory Visit (HOSPITAL_COMMUNITY): Payer: Commercial Managed Care - HMO | Attending: Internal Medicine

## 2021-12-02 ENCOUNTER — Other Ambulatory Visit: Payer: Self-pay

## 2021-12-02 DIAGNOSIS — E785 Hyperlipidemia, unspecified: Secondary | ICD-10-CM | POA: Insufficient documentation

## 2021-12-02 DIAGNOSIS — I2510 Atherosclerotic heart disease of native coronary artery without angina pectoris: Secondary | ICD-10-CM | POA: Diagnosis present

## 2021-12-02 LAB — ECHOCARDIOGRAM COMPLETE
Area-P 1/2: 3.27 cm2
S' Lateral: 2.8 cm

## 2021-12-08 ENCOUNTER — Encounter: Payer: Self-pay | Admitting: Internal Medicine

## 2021-12-22 ENCOUNTER — Encounter (HOSPITAL_BASED_OUTPATIENT_CLINIC_OR_DEPARTMENT_OTHER): Payer: Managed Care, Other (non HMO) | Admitting: Physical Therapy

## 2021-12-23 ENCOUNTER — Ambulatory Visit (HOSPITAL_BASED_OUTPATIENT_CLINIC_OR_DEPARTMENT_OTHER): Payer: Self-pay | Admitting: Physical Therapy

## 2021-12-24 ENCOUNTER — Encounter (HOSPITAL_BASED_OUTPATIENT_CLINIC_OR_DEPARTMENT_OTHER): Payer: Managed Care, Other (non HMO) | Admitting: Physical Therapy

## 2021-12-29 ENCOUNTER — Encounter (HOSPITAL_BASED_OUTPATIENT_CLINIC_OR_DEPARTMENT_OTHER): Payer: Managed Care, Other (non HMO) | Admitting: Physical Therapy

## 2021-12-31 ENCOUNTER — Encounter (HOSPITAL_BASED_OUTPATIENT_CLINIC_OR_DEPARTMENT_OTHER): Payer: Managed Care, Other (non HMO) | Admitting: Physical Therapy

## 2022-01-05 ENCOUNTER — Encounter (HOSPITAL_BASED_OUTPATIENT_CLINIC_OR_DEPARTMENT_OTHER): Payer: Managed Care, Other (non HMO) | Admitting: Physical Therapy

## 2022-01-07 ENCOUNTER — Other Ambulatory Visit: Payer: Self-pay | Admitting: Cardiology

## 2022-01-07 ENCOUNTER — Encounter (HOSPITAL_BASED_OUTPATIENT_CLINIC_OR_DEPARTMENT_OTHER): Payer: Managed Care, Other (non HMO) | Admitting: Physical Therapy

## 2022-01-12 ENCOUNTER — Encounter (HOSPITAL_BASED_OUTPATIENT_CLINIC_OR_DEPARTMENT_OTHER): Payer: Managed Care, Other (non HMO) | Admitting: Physical Therapy

## 2022-01-14 ENCOUNTER — Encounter (HOSPITAL_BASED_OUTPATIENT_CLINIC_OR_DEPARTMENT_OTHER): Payer: Managed Care, Other (non HMO) | Admitting: Physical Therapy

## 2022-02-12 ENCOUNTER — Ambulatory Visit (INDEPENDENT_AMBULATORY_CARE_PROVIDER_SITE_OTHER): Payer: Commercial Managed Care - HMO | Admitting: Orthopaedic Surgery

## 2022-02-12 DIAGNOSIS — M75102 Unspecified rotator cuff tear or rupture of left shoulder, not specified as traumatic: Secondary | ICD-10-CM

## 2022-02-12 DIAGNOSIS — M12812 Other specific arthropathies, not elsewhere classified, left shoulder: Secondary | ICD-10-CM | POA: Diagnosis not present

## 2022-02-12 NOTE — Progress Notes (Signed)
? ?                            ? ? ?Post Operative Evaluation ?  ? ?Procedure/Date of Surgery: 08/20/21 left superior capsular reconstruction ? ?Interval History:  ? ? ?Presents today for 50-month follow-up of his left shoulder.  Overall he is doing extremely well.  He is back to hunting and fishing.  He is now able to hang of jeans without any pain.  He is overall very satisfied with his outcome. ? ?PMH/PSH/Family History/Social History/Meds/Allergies:   ? ?Past Medical History:  ?Diagnosis Date  ? Asthma   ? Borderline diabetes   ? CAD (coronary artery disease)   ? a. s/p anterior STEMI in 09/2011 with aspiration of occluded LAD with DES placed at that time, CTO of RCA noted. EF at 30%  ? Chest pain   ? CHF (congestive heart failure) (HCC)   ? Gout   ? Granulomatosis   ? chronic granulomatosis (dx at age 50 @ [facility])  ? Ischemic cardiomyopathy   ? a. EF at 30% by cath in 09/2011. Has refused echos in the past.   ? MI (myocardial infarction) (HCC)   ? Obesity   ? SOB (shortness of breath)   ? ?Past Surgical History:  ?Procedure Laterality Date  ? CHOLECYSTECTOMY    ? LEFT HEART CATHETERIZATION WITH CORONARY ANGIOGRAM N/A 10/18/2011  ? Procedure: LEFT HEART CATHETERIZATION WITH CORONARY ANGIOGRAM;  Surgeon: Corky Crafts, MD;  Location: [facility] CATH LAB;  Service: Cardiovascular;  Laterality: N/A;  ? LUNG BIOPSY  1990s  ? PERCUTANEOUS CORONARY STENT INTERVENTION (PCI-S) N/A 10/18/2011  ? Procedure: PERCUTANEOUS CORONARY STENT INTERVENTION (PCI-S);  Surgeon: Corky Crafts, MD;  Location: [facility] CATH LAB;  Service: Cardiovascular;  Laterality: N/A;  ? ?Social History  ? ?Socioeconomic History  ? Marital status: Married  ?  Spouse name: Not on file  ? Number of children: 1  ? Years of education: Not on file  ? Highest education level: Not on file  ?Occupational History  ? Occupation: Flooring  ?Tobacco Use  ? Smoking status: Former  ?  Types: Cigarettes  ?  Quit date: 10/17/1989  ?  Years since quitting: 32.3   ? Smokeless tobacco: Never  ?Vaping Use  ? Vaping Use: Never used  ?Substance and Sexual Activity  ? Alcohol use: Not Currently  ?  Alcohol/week: 1.0 standard drink  ?  Types: 1 Cans of beer per week  ? Drug use: Yes  ?  Frequency: 7.0 times per week  ?  Types: Marijuana  ?  Comment: daily  ? Sexual activity: Not on file  ?Other Topics Concern  ? Not on file  ?Social History Narrative  ?   ?   ?   ? ?Social Determinants of Health  ? ?Financial Resource Strain: Not on file  ?Food Insecurity: Not on file  ?Transportation Needs: Not on file  ?Physical Activity: Not on file  ?Stress: Not on file  ?Social Connections: Not on file  ? ?Family History  ?Problem Relation Age of Onset  ? Coronary artery disease Father 23  ?     CABG  ? Heart attack Father   ? Healthy Mother   ? Colon cancer Neg Hx   ? Prostate cancer Neg Hx   ? ?Allergies  ?Allergen Reactions  ? Morphine Nausea And Vomiting  ? ?Current Outpatient Medications  ?Medication Sig Dispense Refill  ?  furosemide (LASIX) 20 MG tablet TAKE 1 TABLET BY MOUTH EVERY DAY 90 tablet 2  ? acetaminophen (TYLENOL) 500 MG tablet Take 500 mg by mouth every 6 (six) hours as needed for moderate pain.    ? albuterol (VENTOLIN HFA) 108 (90 Base) MCG/ACT inhaler Inhale 1-2 puffs into the lungs every 6 (six) hours as needed for wheezing or shortness of breath. 18 g 2  ? aspirin EC 325 MG tablet Take 1 tablet (325 mg total) by mouth daily. 30 tablet 0  ? losartan (COZAAR) 100 MG tablet Take 1 tablet (100 mg total) by mouth daily. 90 tablet 3  ? metoprolol succinate (TOPROL-XL) 100 MG 24 hr tablet Take 1 tablet (100 mg total) by mouth daily. Take with or immediately following a meal. 90 tablet 2  ? nitroGLYCERIN (NITROSTAT) 0.4 MG SL tablet Place 1 tablet (0.4 mg total) under the tongue every 5 (five) minutes as needed for chest pain. 25 tablet 3  ? rosuvastatin (CRESTOR) 20 MG tablet TAKE 1 TABLET BY MOUTH EVERY DAY 90 tablet 3  ? ?No current facility-administered medications for this  visit.  ? ?No results found. ? ?Review of Systems:   ?A ROS was performed including pertinent positives and negatives as documented in the HPI. ? ? ?Musculoskeletal Exam:   ? ?There were no vitals taken for this visit. ? ?Portals are healed.  Is able to flex and extend at the elbow.  2+ radial pulse.  Fingers warm and well-perfused.  Sensation is intact in all distributions of the left arm ? ?Active forward elevation is to 160 degrees and he is able to maintain this position.  External rotation at the side is to 45 degrees, internal rotation is to L1 ? ?Imaging:   ? ?None ? ?I personally reviewed and interpreted the radiographs. ? ? ?Assessment:   ?56 year old male 64-month status post left superior capsular patient overall doing extremely well.  His strength at this time I put no restrictions on him.  He will follow-up as needed ? ?Plan :   ? ?-Return to clinic as needed ? ? ?I personally saw and evaluated the patient, and participated in the management and treatment plan. ? ?Huel Cote, MD ?Attending Physician, Orthopedic Surgery ? ?This document was dictated using Conservation officer, historic buildings. A reasonable attempt at proof reading has been made to minimize errors. ? ?

## 2022-02-18 ENCOUNTER — Other Ambulatory Visit: Payer: Self-pay | Admitting: Cardiology

## 2022-03-10 ENCOUNTER — Telehealth: Payer: Self-pay | Admitting: Cardiology

## 2022-03-10 ENCOUNTER — Other Ambulatory Visit: Payer: Self-pay

## 2022-03-10 MED ORDER — ROSUVASTATIN CALCIUM 20 MG PO TABS: 20.0000 mg | ORAL_TABLET | Freq: Every day | ORAL | 3 refills | Status: DC

## 2022-03-10 NOTE — Telephone Encounter (Signed)
*  STAT* If patient is at the pharmacy, call can be transferred to refill team.   1. Which medications need to be refilled? (please list name of each medication and dose if known) rosuvastatin (CRESTOR) 20 MG tablet  2. Which pharmacy/location (including street and city if local pharmacy) is medication to be sent to? [id number] - [place], [place] - [address]  3. Do they need a 30 day or 90 day supply? 30 day

## 2022-05-01 ENCOUNTER — Other Ambulatory Visit: Payer: Self-pay | Admitting: Cardiology

## 2022-07-05 ENCOUNTER — Ambulatory Visit: Payer: Commercial Managed Care - HMO | Admitting: Cardiology

## 2022-07-22 NOTE — Progress Notes (Signed)
Cardiology Office Note   Date:  07/23/2022   ID:  Brian Jeans Sr., DOB [birth year], MRN [id number]    PCP:  Wanda Plump, MD  Cardiologist:   Rollene Rotunda, MD   Chief Complaint  Patient presents with   Coronary Artery Disease      History of Present Illness: Brian Gross. is a 56 y.o. male who presents for evaluation of CAD. He had a negative stress perfusion study 2008. Unfortunately he presented in late December of 2012 with an anterior myocardial infarction. This was a late presentation. He did have an occluded LAD which was treated with a drug-eluting stent. There was also RCA occlusion which was apparently chronic.  He did not come back for follow-up for about 2 years until he got some insurance. He stopped his medication. He then came back in  2015. I tried to get an ultrasound but he could not afford this.  I saw him in 2017 and he was not able to afford all of his medicines.  In 2018 he was on [place] Stader's PA schedule at [facility] for acute dyspnea. He had a [employer].  There was a larger area of anterior/anteroseptal and apical wall defect consistent with scar.  The EF was 35%.  At the last visit I increased his Cozaar.     Since I last saw him he has done well.  He is lost a significant amount of weight.  He feels much better.  He denies snoring.  He breathes better.  He is being active. The patient denies any new symptoms such as chest discomfort, neck or arm discomfort. There has been no new shortness of breath, PND or orthopnea. There have been no reported palpitations, presyncope or syncope.    Past Medical History:  Diagnosis Date   Asthma    Borderline diabetes    CAD (coronary artery disease)    a. s/p anterior STEMI in 09/2011 with aspiration of occluded LAD with DES placed at that time, CTO of RCA noted. EF at 30%   Chest pain    CHF (congestive heart failure) (HCC)    Gout    Granulomatosis    chronic granulomatosis (dx at age 69 @ [facility])   Ischemic cardiomyopathy    a. EF at 30% by cath in 09/2011. Has refused echos in the past.    MI (myocardial infarction) (HCC)    Obesity    SOB (shortness of breath)     Past Surgical History:  Procedure Laterality Date   CHOLECYSTECTOMY     LEFT HEART CATHETERIZATION WITH CORONARY ANGIOGRAM N/A 10/18/2011   Procedure: LEFT HEART CATHETERIZATION WITH CORONARY ANGIOGRAM;  Surgeon: Corky Crafts, MD;  Location: [facility] CATH LAB;  Service: Cardiovascular;  Laterality: N/A;   LUNG BIOPSY  1990s   PERCUTANEOUS CORONARY STENT INTERVENTION (PCI-S) N/A 10/18/2011   Procedure: PERCUTANEOUS CORONARY STENT INTERVENTION (PCI-S);  Surgeon: Corky Crafts, MD;  [facility]: [facility] CATH LAB;  Service: Cardiovascular;  Laterality: N/A;     Current Outpatient Medications  Medication Sig Dispense Refill   acetaminophen (TYLENOL) 500 MG tablet Take 500 mg by mouth every 6 (six) hours as needed for moderate pain.     albuterol (VENTOLIN HFA) 108 (90 Base) MCG/ACT inhaler Inhale 1-2 puffs into the lungs every 6 (six) hours as needed for wheezing or shortness of breath. 18 g 2   aspirin EC 81 MG tablet Take 81 mg by mouth daily. Swallow  whole.     furosemide (LASIX) 20 MG tablet TAKE 1 TABLET BY MOUTH EVERY DAY 90 tablet 2   losartan (COZAAR) 100 MG tablet TAKE 1 TABLET BY MOUTH EVERY DAY 90 tablet 3   metoprolol succinate (TOPROL-XL) 100 MG 24 hr tablet TAKE 1 TABLET BY MOUTH DAILY. TAKE WITH OR IMMEDIATELY FOLLOWING A MEAL. 90 tablet 1   nitroGLYCERIN (NITROSTAT) 0.4 MG SL tablet Place 1 tablet (0.4 mg total) under the tongue every 5 (five) minutes as needed for chest pain. 25 tablet 3   rosuvastatin (CRESTOR) 20 MG tablet Take 1 tablet (20 mg total) by mouth daily. 90 tablet 3   No current facility-administered medications for this visit.    Allergies:   Morphine    ROS:  Please see the history of present illness.   Otherwise, review of systems are positive for none.   All other systems are  reviewed and negative.    PHYSICAL EXAM: VS:  BP 116/68   Pulse 69   Ht 5\' 9"  (1.753 m)   Wt 263 lb 12.8 oz (119.7 kg)   SpO2 94%   BMI 38.96 kg/m  , BMI Body mass index is 38.96 kg/m. GENERAL:  Well appearing NECK:  No jugular venous distention, waveform within normal limits, carotid upstroke brisk and symmetric, no bruits, no thyromegaly LUNGS:  Clear to auscultation bilaterally CHEST:  Unremarkable HEART:  PMI not displaced or sustained,S1 and S2 within normal limits, no S3, no S4, no clicks, no rubs, no murmurs ABD:  Flat, positive bowel sounds normal in frequency in pitch, no bruits, no rebound, no guarding, no midline pulsatile mass, no hepatomegaly, no splenomegaly EXT:  2 plus pulses throughout, no edema, no cyanosis no clubbing   EKG:  EKG is not ordered today. The ekg ordered 11/09/2021 demonstrates sinus rhythm, rate 66, axis within normal limits, old anteroseptal infarct, nonspecific T wave changes with lateral T wave inversions unchanged from previous, low voltage in the limb in chest leads.     Recent Labs: 08/13/2021: ALT 20; BUN 14; Creatinine, Ser 1.22; Hemoglobin 15.7; Platelets 203; Potassium 4.3; Sodium 134    Lipid Panel    Component Value Date/Time   CHOL 127 07/04/2018 1009   TRIG 129 07/04/2018 1009   HDL 40 07/04/2018 1009   CHOLHDL 3.2 07/04/2018 1009   CHOLHDL 3.7 08/15/2015 0926   VLDL 42 (H) 08/15/2015 0926   LDLCALC 61 07/04/2018 1009   LDLDIRECT 155.7 06/21/2013 1000      Wt Readings from Last 3 Encounters:  07/23/22 263 lb 12.8 oz (119.7 kg)  11/06/21 294 lb 9.6 oz (133.6 kg)  08/20/21 300 lb (136.1 kg)      Other studies Reviewed: Additional studies/ records that were reviewed today include: None. Review of the above records demonstrates: N/A   ASSESSMENT AND PLAN:  CAD:   He has had no symptoms.  We will continue with aggressive risk reduction.  ISCHEMIC CARDIOMYOPATHY:    He has not tolerated med titration and I cannot  switch to [facility] because of cost.  His ejection fraction was unassessable secondary to poor imaging on the last echo.  But he since lost a lot of weight.  He wants to try to lose more weight his goal is to get down to 230.  I am going to schedule another echocardiogram in 6 months hopefully with better imaging and see where we are with his ejection fraction.  Regardless he seems to be asymptomatic and euvolemic.  DYSLIPIDEMIA:    LDL was 61 but these are all data.  I will repeat a lipid profile.   HTN:  This is being managed in the context of treating his CHF    OBESITY:     I am so proud of his weight loss.  The next goal is 230 pounds.  Current medicines are reviewed at length with the patient today.  The patient does not have concerns regarding medicines.   The following changes have been made: None  Labs/ tests ordered today include:   Orders Placed This Encounter  Procedures   Basic Metabolic Panel (BMET)   CBC   Lipid panel   ECHOCARDIOGRAM COMPLETE     Disposition:   FU with me in 12 months   Signed, Rollene Rotunda, MD  07/23/2022 9:39 AM    [facility]

## 2022-07-23 ENCOUNTER — Encounter: Payer: Self-pay | Admitting: Cardiology

## 2022-07-23 ENCOUNTER — Ambulatory Visit: Payer: Commercial Managed Care - HMO | Attending: Cardiology | Admitting: Cardiology

## 2022-07-23 VITALS — BP 116/68 | HR 69 | Ht 69.0 in | Wt 263.8 lb

## 2022-07-23 DIAGNOSIS — I2510 Atherosclerotic heart disease of native coronary artery without angina pectoris: Secondary | ICD-10-CM | POA: Diagnosis not present

## 2022-07-23 DIAGNOSIS — E785 Hyperlipidemia, unspecified: Secondary | ICD-10-CM | POA: Diagnosis not present

## 2022-07-23 DIAGNOSIS — I255 Ischemic cardiomyopathy: Secondary | ICD-10-CM

## 2022-07-23 NOTE — Patient Instructions (Signed)
  Testing/Procedures:  Your physician has requested that you have an echocardiogram. Echocardiography is a painless test that uses sound waves to create images of your heart. It provides your doctor with information about the size and shape of your heart and how well your heart's chambers and valves are working. This procedure takes approximately one hour. There are no restrictions for this procedure. [place] 6 MONTHS   Follow-Up: At [facility], you and your health needs are our priority.  As part of our continuing mission to provide you with exceptional heart care, we have created designated Provider Care Teams.  These Care Teams include your primary Cardiologist (physician) and Advanced Practice Providers (APPs -  Physician Assistants and Nurse Practitioners) who all work together to provide you with the care you need, when you need it.  We recommend signing up for the patient portal called "MyChart".  Sign up information is provided on this After Visit Summary.  MyChart is used to connect with patients for Virtual Visits (Telemedicine).  Patients are able to view lab/test results, encounter notes, upcoming appointments, etc.  Non-urgent messages can be sent to your provider as well.   To learn more about what you can do with MyChart, go to [email or web address].    Your next appointment:   12 month(s)  The format for your next appointment:   In Person  Provider:   Minus Breeding, MD

## 2022-07-24 LAB — BASIC METABOLIC PANEL
BUN/Creatinine Ratio: 21 — ABNORMAL HIGH (ref 9–20)
BUN: 22 mg/dL (ref 6–24)
CO2: 25 mmol/L (ref 20–29)
Calcium: 9.6 mg/dL (ref 8.7–10.2)
Chloride: 100 mmol/L (ref 96–106)
Creatinine, Ser: 1.05 mg/dL (ref 0.76–1.27)
Glucose: 90 mg/dL (ref 70–99)
Potassium: 4.5 mmol/L (ref 3.5–5.2)
Sodium: 141 mmol/L (ref 134–144)
eGFR: 85 mL/min/{1.73_m2} (ref >59)

## 2022-07-24 LAB — LIPID PANEL
Chol/HDL Ratio: 3.6 ratio (ref 0.0–5.0)
Cholesterol, Total: 133 mg/dL (ref 100–199)
HDL: 37 mg/dL — ABNORMAL LOW (ref >39)
LDL Chol Calc (NIH): 73 mg/dL (ref 0–99)
Triglycerides: 131 mg/dL (ref 0–149)
VLDL Cholesterol Cal: 23 mg/dL (ref 5–40)

## 2022-07-24 LAB — CBC
Hematocrit: 45.4 % (ref 37.5–51.0)
Hemoglobin: 15.3 g/dL (ref 13.0–17.7)
MCH: 31 pg (ref 26.6–33.0)
MCHC: 33.7 g/dL (ref 31.5–35.7)
MCV: 92 fL (ref 79–97)
Platelets: 183 10*3/uL (ref 150–450)
RBC: 4.94 x10E6/uL (ref 4.14–5.80)
RDW: 13.1 % (ref 11.6–15.4)
WBC: 8.4 10*3/uL (ref 3.4–10.8)

## 2022-07-30 ENCOUNTER — Encounter: Payer: Self-pay | Admitting: *Deleted

## 2022-09-27 ENCOUNTER — Other Ambulatory Visit: Payer: Self-pay | Admitting: Cardiology

## 2022-10-26 ENCOUNTER — Other Ambulatory Visit: Payer: Self-pay | Admitting: Cardiology

## 2023-01-20 ENCOUNTER — Ambulatory Visit (HOSPITAL_COMMUNITY): Payer: Commercial Managed Care - HMO

## 2023-01-22 ENCOUNTER — Other Ambulatory Visit: Payer: Self-pay | Admitting: Cardiology

## 2023-02-17 ENCOUNTER — Ambulatory Visit (HOSPITAL_COMMUNITY): Payer: Commercial Managed Care - HMO

## 2023-02-23 ENCOUNTER — Other Ambulatory Visit: Payer: Self-pay | Admitting: Cardiology

## 2023-04-16 ENCOUNTER — Other Ambulatory Visit: Payer: Self-pay | Admitting: Cardiology

## 2023-07-11 ENCOUNTER — Ambulatory Visit: Payer: Commercial Managed Care - HMO | Admitting: Physician Assistant

## 2023-07-11 ENCOUNTER — Ambulatory Visit (INDEPENDENT_AMBULATORY_CARE_PROVIDER_SITE_OTHER): Payer: Commercial Managed Care - HMO | Admitting: Physician Assistant

## 2023-07-11 ENCOUNTER — Encounter: Payer: Self-pay | Admitting: Physician Assistant

## 2023-07-11 VITALS — BP 110/80 | HR 66 | Temp 97.6°F | Ht 69.0 in | Wt 287.0 lb

## 2023-07-11 DIAGNOSIS — R0781 Pleurodynia: Secondary | ICD-10-CM

## 2023-07-11 MED ORDER — TRAMADOL HCL 50 MG PO TABS: 50.0000 mg | ORAL_TABLET | Freq: Three times a day (TID) | ORAL | 0 refills | Status: AC | PRN

## 2023-07-11 NOTE — Progress Notes (Signed)
Established patient visit   Patient: Brian MATKIN Sr.   DOB: [birth year]   57 y.o. Male  MRN: [id number] Visit Date: 07/11/2023  Today's healthcare provider: Alfredia Ferguson, PA-C   Cc. Fall with right sided rib pain  Subjective    HPI  Discussed the use of AI scribe software for clinical note transcription with the patient, who gave verbal consent to proceed.  History of Present Illness   The patient presents with right sided chest/rib pain resulting from a fall. The patient fell on the same spot where he had previously injured himself a few years ago. The pain is located from the patient's right lateral chest to his right flank. The patient reports that the pain is worse when coughing and has been disrupting his sleep. The patient has been managing the pain with leftover pain medication and prednisone, which he reports has provided some relief. However, the patient is concerned about the slow healing process and is seeking further medical intervention.      Medications: Outpatient Medications Prior to Visit  Medication Sig   acetaminophen (TYLENOL) 500 MG tablet Take 500 mg by mouth every 6 (six) hours as needed for moderate pain.   albuterol (VENTOLIN HFA) 108 (90 Base) MCG/ACT inhaler Inhale 1-2 puffs into the lungs every 6 (six) hours as needed for wheezing or shortness of breath.   aspirin EC 81 MG tablet Take 81 mg by mouth daily. Swallow whole.   furosemide (LASIX) 20 MG tablet TAKE 1 TABLET BY MOUTH EVERY DAY   losartan (COZAAR) 100 MG tablet TAKE 1 TABLET BY MOUTH EVERY DAY   metoprolol succinate (TOPROL-XL) 100 MG 24 hr tablet TAKE 1 TABLET BY MOUTH EVERY DAY WITH OR IMMEDIATELY FOLLOWING A MEAL   nitroGLYCERIN (NITROSTAT) 0.4 MG SL tablet Place 1 tablet (0.4 mg total) under the tongue every 5 (five) minutes as needed for chest pain.   rosuvastatin (CRESTOR) 20 MG tablet TAKE 1 TABLET BY MOUTH EVERY DAY   No facility-administered medications prior to visit.     Review of Systems  Constitutional:  Negative for fatigue and fever.  Respiratory:  Negative for cough and shortness of breath.   Cardiovascular:  Negative for chest pain, palpitations and leg swelling.  Neurological:  Negative for dizziness and headaches.      Objective    BP 110/80   Pulse 66   Temp 97.6 F (36.4 C) (Temporal)   Ht 5\' 9"  (1.753 m)   Wt 287 lb (130.2 kg)   SpO2 96%   BMI 42.38 kg/m   Physical Exam Vitals reviewed.  Constitutional:      Appearance: He is not ill-appearing.  HENT:     Head: Normocephalic.  Eyes:     Conjunctiva/sclera: Conjunctivae normal.  Cardiovascular:     Rate and Rhythm: Normal rate and regular rhythm.  Pulmonary:     Effort: Pulmonary effort is normal. No respiratory distress.     Breath sounds: Normal breath sounds.  Musculoskeletal:     Comments: No ecchymosis, some tenderness to right flank  Neurological:     General: No focal deficit present.     Mental Status: He is alert and oriented to person, place, and time.  Psychiatric:        Mood and Affect: Mood normal.        Behavior: Behavior normal.      No results found for any visits on 07/11/23.  Assessment & Plan  1. Rib pain on right side   No signs of respiratory distress or pneumothorax on examination. -Prescribe Tramadol 50mg , 1-2 tablets as needed for pain. -Advise against mixing Tramadol with other pain medications. -Recommending xray today to assess for rib fracture, pt declines.       - traMADol (ULTRAM) 50 MG tablet; Take 1-2 tablets (50-100 mg total) by mouth every 8 (eight) hours as needed for up to 5 days.  Dispense: 30 tablet; Refill: 0  Return if symptoms worsen or fail to improve.      I, Alfredia Ferguson, PA-C have reviewed all documentation for this visit. The documentation on  07/11/23   for the exam, diagnosis, procedures, and orders are all accurate and complete.    Alfredia Ferguson, PA-C  [facility] at [facility] [phone] (phone) [phone] (fax)  [facility]

## 2023-07-11 NOTE — Progress Notes (Deleted)
Established patient visit   Patient: Brian LAWRANCE Sr.   DOB: 25-Jan-1966   57 y.o. Male  MRN: [id number] Visit Date: 07/11/2023  Today's healthcare provider: Alfredia Ferguson, PA-C   No chief complaint on file.  Subjective    HPI  ***  Medications: Outpatient Medications Prior to Visit  Medication Sig   losartan (COZAAR) 100 MG tablet TAKE 1 TABLET BY MOUTH EVERY DAY   acetaminophen (TYLENOL) 500 MG tablet Take 500 mg by mouth every 6 (six) hours as needed for moderate pain.   albuterol (VENTOLIN HFA) 108 (90 Base) MCG/ACT inhaler Inhale 1-2 puffs into the lungs every 6 (six) hours as needed for wheezing or shortness of breath.   aspirin EC 81 MG tablet Take 81 mg by mouth daily. Swallow whole.   furosemide (LASIX) 20 MG tablet TAKE 1 TABLET BY MOUTH EVERY DAY   metoprolol succinate (TOPROL-XL) 100 MG 24 hr tablet TAKE 1 TABLET BY MOUTH EVERY DAY WITH OR IMMEDIATELY FOLLOWING A MEAL   nitroGLYCERIN (NITROSTAT) 0.4 MG SL tablet Place 1 tablet (0.4 mg total) under the tongue every 5 (five) minutes as needed for chest pain.   rosuvastatin (CRESTOR) 20 MG tablet TAKE 1 TABLET BY MOUTH EVERY DAY   No facility-administered medications prior to visit.    Review of Systems    Objective    There were no vitals taken for this visit.  Physical Exam  ***  No results found for any visits on 07/11/23.  Assessment & Plan     ***  No follow-ups on file.      I, Alfredia Ferguson, PA-C have reviewed all documentation for this visit. The documentation on  07/11/23   for the exam, diagnosis, procedures, and orders are all accurate and complete.    Alfredia Ferguson, PA-C  [facility] at [facility] [phone] (phone) [phone] (fax)  [facility]

## 2023-07-12 ENCOUNTER — Other Ambulatory Visit: Payer: Self-pay | Admitting: Physician Assistant

## 2023-07-12 ENCOUNTER — Telehealth: Payer: Self-pay | Admitting: Internal Medicine

## 2023-07-12 ENCOUNTER — Ambulatory Visit (HOSPITAL_BASED_OUTPATIENT_CLINIC_OR_DEPARTMENT_OTHER)
Admission: RE | Admit: 2023-07-12 | Discharge: 2023-07-12 | Disposition: A | Discharge status: Home / Self Care | Payer: Commercial Managed Care - HMO | Source: Ambulatory Visit | Attending: Physician Assistant | Admitting: Physician Assistant

## 2023-07-12 DIAGNOSIS — R0781 Pleurodynia: Secondary | ICD-10-CM

## 2023-07-12 NOTE — Telephone Encounter (Signed)
Brian Gross (spouse DPR Ok) called stating that pt's pain medication is not working and was up all night. She would like to know if there was another course of treatment that would work better. Please Advise.

## 2023-07-12 NOTE — Telephone Encounter (Signed)
Spoke w/ Burna Mortimer- Pt's wife and Pt- informed of recommendations, he is in a lot of pain and was unable to sleep much last night. He is requesting x-ray orders to be done downstairs- he will come by later today please.

## 2023-07-12 NOTE — Telephone Encounter (Signed)
Please advise 

## 2023-07-12 NOTE — Telephone Encounter (Signed)
Ordered x-rays.

## 2023-07-22 IMAGING — MR MR SHOULDER*L* W/O CM
4 of 5 series · 20 of 40 positions shown · non-contrast
Comparison: X-ray shoulder 07/10/2021.

CLINICAL DATA: Patient complains of left shoulder pain with limited
range of motion after falling and landing on the shoulder. No
surgical history.

EXAM:
MRI OF THE LEFT SHOULDER WITHOUT CONTRAST
TECHNIQUE: Multiplanar, multisequence MR imaging of the shoulder was performed.
No intravenous contrast was administered.

[Series 6: T2 fat-sat · axial · left · 3.0mm · 0.53mm/px · z∈[-75,+10]mm · 7 of 27 slices shown (1 of 3)]
[im 1/27]
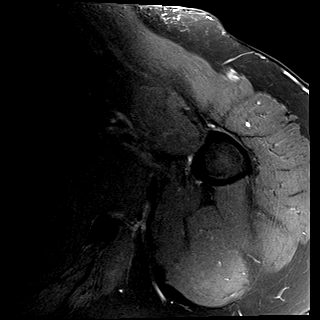
[im 3/27]
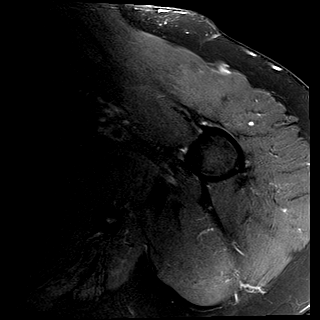
[im 9/27]
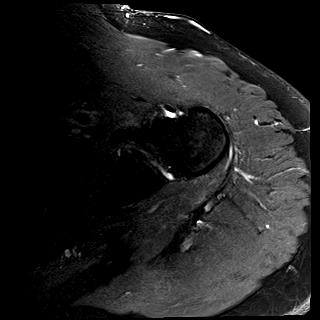
[im 12/27]
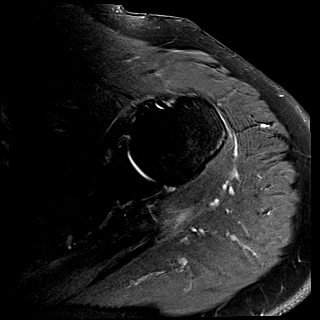
[im 15/27]
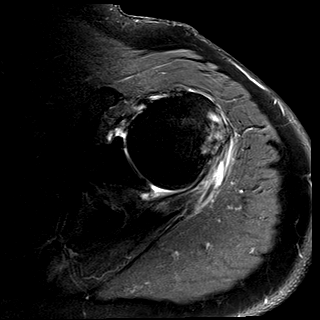
[im 18/27]
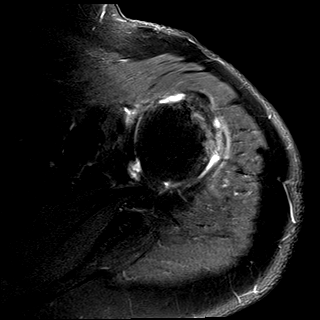
[im 24/27]
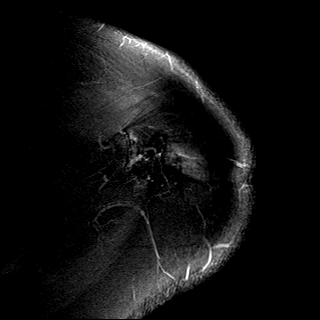

[Series 7: T2 fat-sat · oblique · left · 4.0mm · 0.22mm/px · 3 of 21 slices shown (2 of 3)]
[im 4/21]
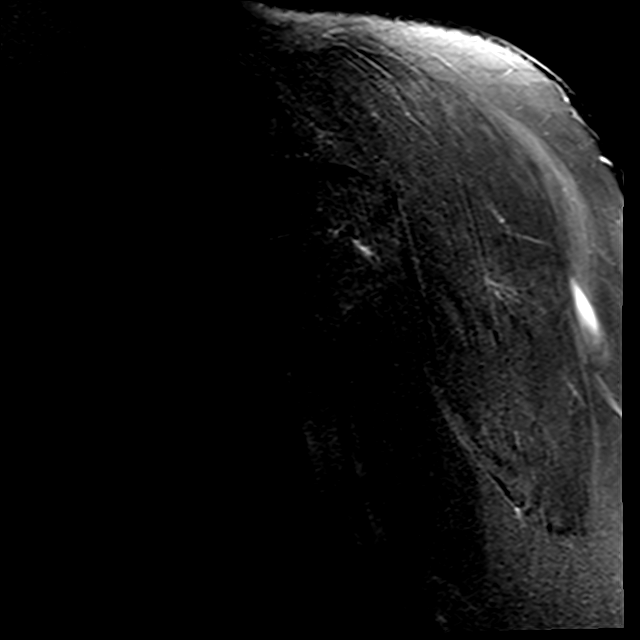
[im 11/21]
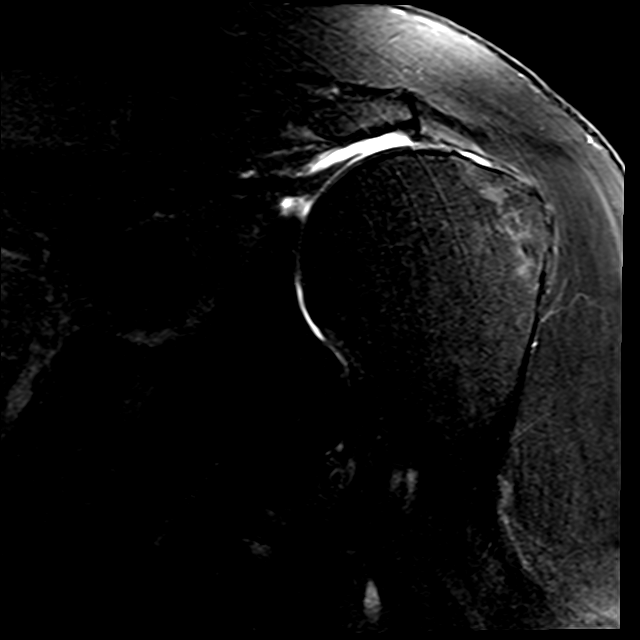
[im 17/21]
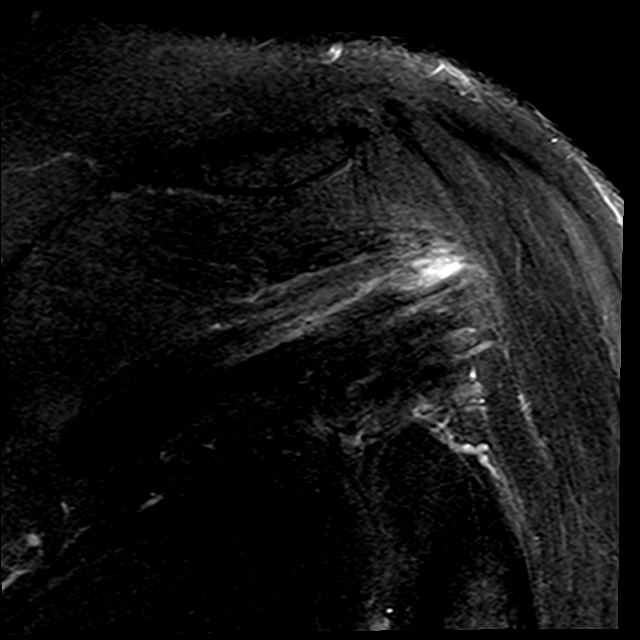

[Series 8: PD · oblique · left · 4.0mm · 0.22mm/px · 7 of 21 slices shown]
[im 1/21]
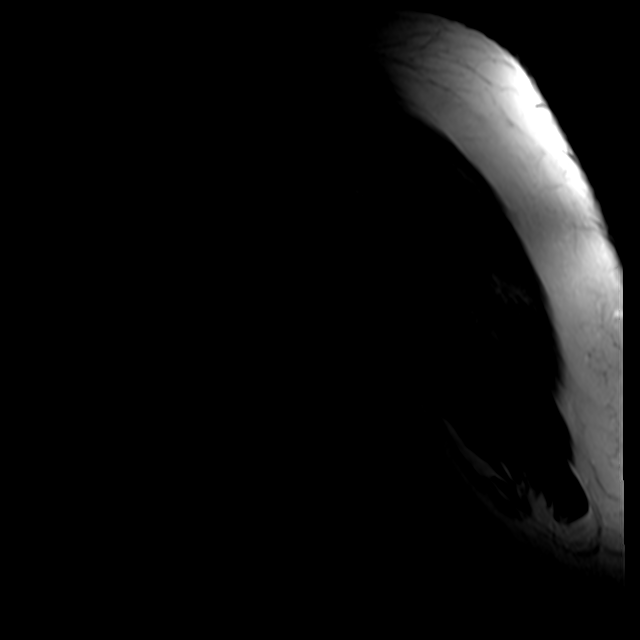
[im 4/21]
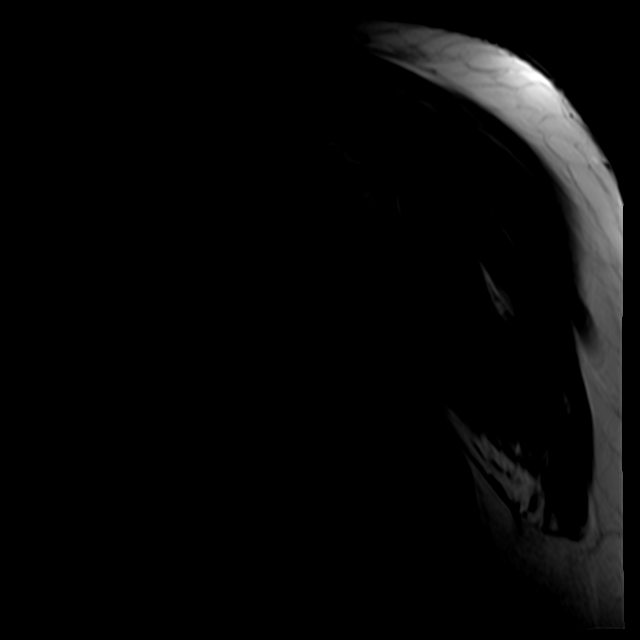
[im 7/21]
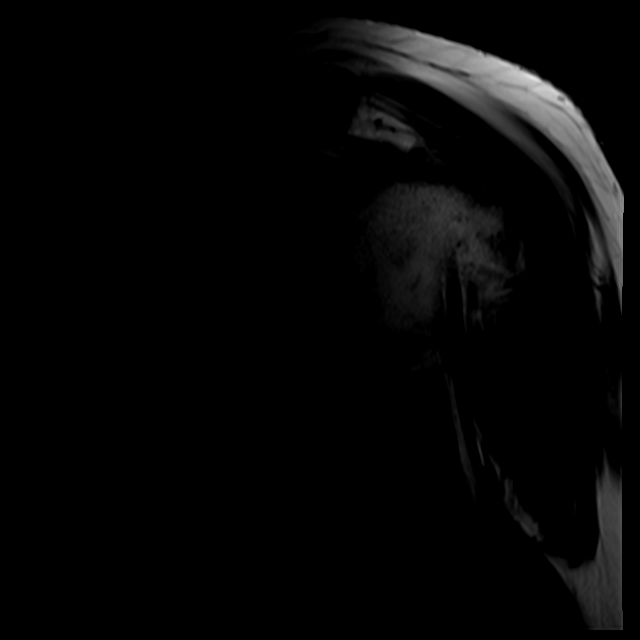
[im 11/21]
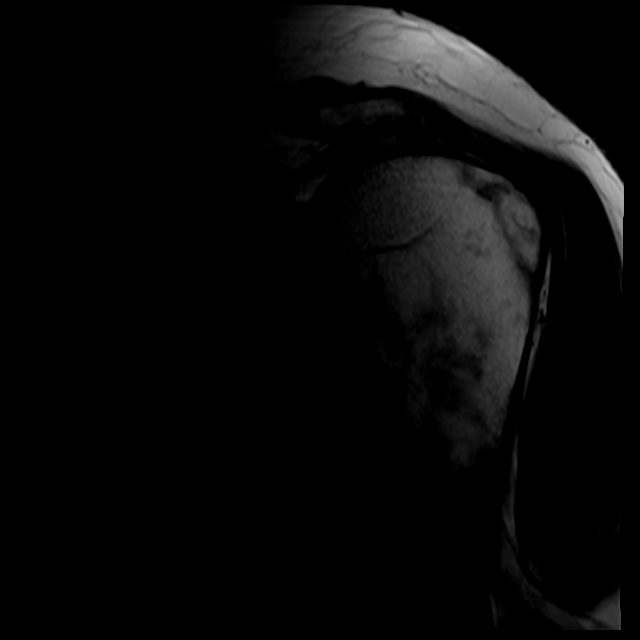
[im 14/21]
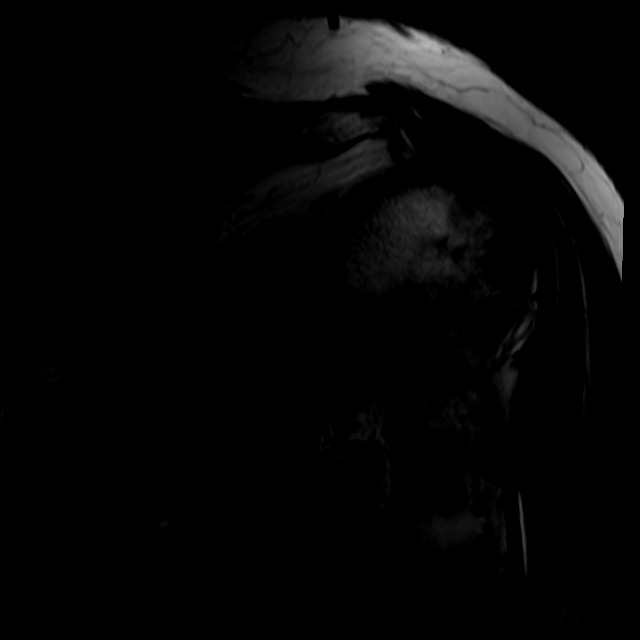
[im 17/21]
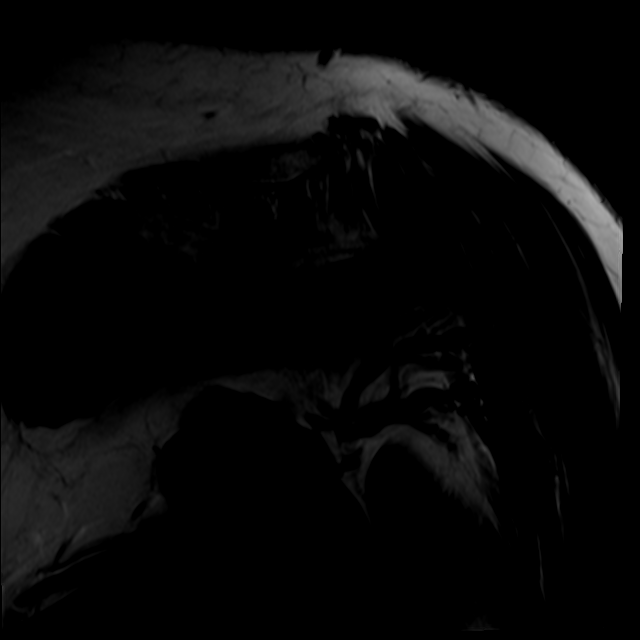
[im 21/21]
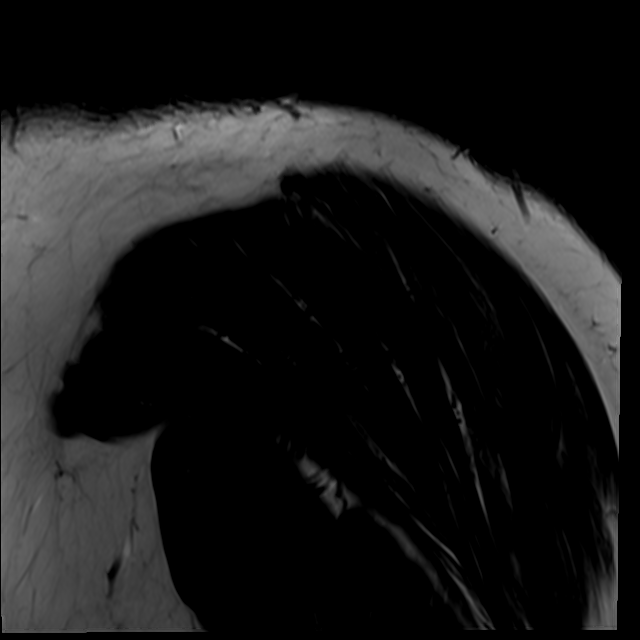

[Series 9: T2 fat-sat · oblique · left · 4.0mm · 0.44mm/px · 3 of 23 slices shown (3 of 3)]
[im 4/23]
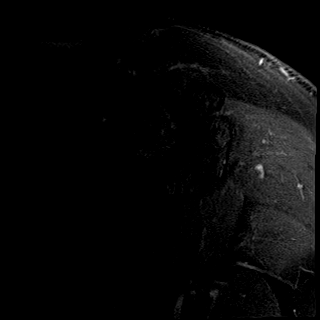
[im 13/23]
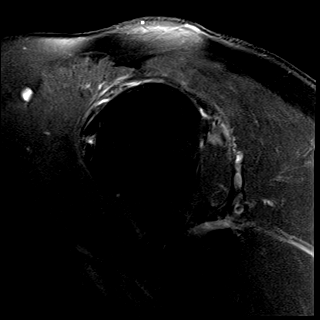
[im 19/23]
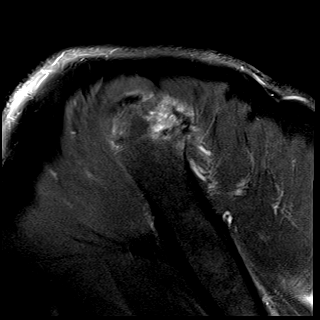

[20 of 40 positions shown; findings below may reference images not displayed]

FINDINGS: Rotator cuff: Complete full-thickness tears of the supraspinatus and
infraspinatus tendons retracted to the level of the glenohumeral
joint. Full-thickness incomplete tear of the subscapularis tendon
insertion along its cranial margin. Intact teres minor.

Muscles: Advanced fatty atrophy of the supraspinatus and
infraspinatus muscles. Mild teres minor intramuscular edema.

Biceps long head: Long head biceps tendon is medially perched at the
groove entry zone without tear or significant tendinosis.

Acromioclavicular Joint: Mild arthropathy of the AC joint. Small
volume subacromial-subdeltoid bursal fluid communicating with the
glenohumeral joint.

Glenohumeral Joint: Mild diffuse chondral thinning. No significant
joint effusion. Humeral head is high-riding relative to the glenoid
without dislocation.

Labrum:  Superior labrum appears degenerated.

Bones: Late subacute to chronic appearing nondisplaced fracture of
the greater tuberosity with mild associated bone marrow edema
(series 8, image 10). Fracture line appears largely healed. Osseous
structures appear otherwise intact. No suspicious bone lesion.

Other: None.
IMPRESSION: 1. Complete full-thickness retracted tears of the supraspinatus and
infraspinatus tendons with associated muscle atrophy and fatty
infiltration.
2. Full-thickness incomplete tear of the distal subscapularis
tendon.
3. Late subacute to chronic appearing nondisplaced fracture of the
greater tuberosity with mild associated bone marrow edema. Fracture
line appears largely healed.
4. Mild acromioclavicular and glenohumeral osteoarthritis.

## 2023-09-12 ENCOUNTER — Inpatient Hospital Stay (HOSPITAL_COMMUNITY)
Admission: EM | Admit: 2023-09-12 | Discharge: 2023-09-17 | DRG: 605 | Disposition: A | Discharge status: Home / Self Care | Payer: Commercial Managed Care - HMO | Attending: Surgery | Admitting: Surgery

## 2023-09-12 ENCOUNTER — Other Ambulatory Visit: Payer: Self-pay

## 2023-09-12 ENCOUNTER — Emergency Department (HOSPITAL_COMMUNITY): Payer: Commercial Managed Care - HMO

## 2023-09-12 ENCOUNTER — Encounter (HOSPITAL_COMMUNITY): Payer: Self-pay

## 2023-09-12 DIAGNOSIS — S8012XA Contusion of left lower leg, initial encounter: Secondary | ICD-10-CM | POA: Diagnosis present

## 2023-09-12 DIAGNOSIS — J4489 Other specified chronic obstructive pulmonary disease: Secondary | ICD-10-CM | POA: Diagnosis present

## 2023-09-12 DIAGNOSIS — E785 Hyperlipidemia, unspecified: Secondary | ICD-10-CM | POA: Diagnosis present

## 2023-09-12 DIAGNOSIS — Z9049 Acquired absence of other specified parts of digestive tract: Secondary | ICD-10-CM | POA: Diagnosis not present

## 2023-09-12 DIAGNOSIS — I252 Old myocardial infarction: Secondary | ICD-10-CM | POA: Diagnosis not present

## 2023-09-12 DIAGNOSIS — Z955 Presence of coronary angioplasty implant and graft: Secondary | ICD-10-CM | POA: Diagnosis not present

## 2023-09-12 DIAGNOSIS — Z79899 Other long term (current) drug therapy: Secondary | ICD-10-CM

## 2023-09-12 DIAGNOSIS — I2510 Atherosclerotic heart disease of native coronary artery without angina pectoris: Secondary | ICD-10-CM | POA: Diagnosis present

## 2023-09-12 DIAGNOSIS — G4733 Obstructive sleep apnea (adult) (pediatric): Secondary | ICD-10-CM | POA: Diagnosis present

## 2023-09-12 DIAGNOSIS — Z6841 Body Mass Index (BMI) 40.0 and over, adult: Secondary | ICD-10-CM | POA: Diagnosis not present

## 2023-09-12 DIAGNOSIS — R112 Nausea with vomiting, unspecified: Secondary | ICD-10-CM | POA: Diagnosis not present

## 2023-09-12 DIAGNOSIS — Y9389 Activity, other specified: Secondary | ICD-10-CM

## 2023-09-12 DIAGNOSIS — W3189XA Contact with other specified machinery, initial encounter: Secondary | ICD-10-CM | POA: Diagnosis not present

## 2023-09-12 DIAGNOSIS — S7011XA Contusion of right thigh, initial encounter: Secondary | ICD-10-CM | POA: Diagnosis present

## 2023-09-12 DIAGNOSIS — D62 Acute posthemorrhagic anemia: Secondary | ICD-10-CM | POA: Diagnosis not present

## 2023-09-12 DIAGNOSIS — Z885 Allergy status to narcotic agent status: Secondary | ICD-10-CM

## 2023-09-12 DIAGNOSIS — E66813 Obesity, class 3: Secondary | ICD-10-CM | POA: Diagnosis present

## 2023-09-12 DIAGNOSIS — Y9269 Other specified industrial and construction area as the place of occurrence of the external cause: Secondary | ICD-10-CM | POA: Diagnosis not present

## 2023-09-12 DIAGNOSIS — I255 Ischemic cardiomyopathy: Secondary | ICD-10-CM | POA: Diagnosis present

## 2023-09-12 DIAGNOSIS — Z23 Encounter for immunization: Secondary | ICD-10-CM | POA: Diagnosis not present

## 2023-09-12 DIAGNOSIS — Z7982 Long term (current) use of aspirin: Secondary | ICD-10-CM

## 2023-09-12 DIAGNOSIS — M79604 Pain in right leg: Secondary | ICD-10-CM | POA: Diagnosis present

## 2023-09-12 DIAGNOSIS — S8781XA Crushing injury of right lower leg, initial encounter: Principal | ICD-10-CM

## 2023-09-12 DIAGNOSIS — Z87891 Personal history of nicotine dependence: Secondary | ICD-10-CM

## 2023-09-12 DIAGNOSIS — N179 Acute kidney failure, unspecified: Secondary | ICD-10-CM | POA: Diagnosis present

## 2023-09-12 DIAGNOSIS — R7303 Prediabetes: Secondary | ICD-10-CM | POA: Diagnosis present

## 2023-09-12 DIAGNOSIS — I959 Hypotension, unspecified: Secondary | ICD-10-CM | POA: Diagnosis present

## 2023-09-12 DIAGNOSIS — I2582 Chronic total occlusion of coronary artery: Secondary | ICD-10-CM | POA: Diagnosis present

## 2023-09-12 DIAGNOSIS — Z8249 Family history of ischemic heart disease and other diseases of the circulatory system: Secondary | ICD-10-CM | POA: Diagnosis not present

## 2023-09-12 DIAGNOSIS — I110 Hypertensive heart disease with heart failure: Secondary | ICD-10-CM | POA: Diagnosis present

## 2023-09-12 DIAGNOSIS — S8011XA Contusion of right lower leg, initial encounter: Secondary | ICD-10-CM | POA: Diagnosis not present

## 2023-09-12 DIAGNOSIS — Y990 Civilian activity done for income or pay: Secondary | ICD-10-CM

## 2023-09-12 DIAGNOSIS — I5022 Chronic systolic (congestive) heart failure: Secondary | ICD-10-CM | POA: Diagnosis present

## 2023-09-12 LAB — CBC
HCT: 45.6 % (ref 39.0–52.0)
HCT: 42.5 % (ref 39.0–52.0)
Hemoglobin: 14.7 g/dL (ref 13.0–17.0)
Hemoglobin: 13.8 g/dL (ref 13.0–17.0)
MCH: 30.2 pg (ref 26.0–34.0)
MCH: 29.7 pg (ref 26.0–34.0)
MCHC: 32.2 g/dL (ref 30.0–36.0)
MCHC: 32.5 g/dL (ref 30.0–36.0)
MCV: 93.6 fL (ref 80.0–100.0)
MCV: 91.6 fL (ref 80.0–100.0)
Platelets: 269 10*3/uL (ref 150–400)
Platelets: 238 10*3/uL (ref 150–400)
RBC: 4.87 MIL/uL (ref 4.22–5.81)
RBC: 4.64 MIL/uL (ref 4.22–5.81)
RDW: 13 % (ref 11.5–15.5)
RDW: 13.7 % (ref 11.5–15.5)
WBC: 24.8 10*3/uL — ABNORMAL HIGH (ref 4.0–10.5)
WBC: 12.7 10*3/uL — ABNORMAL HIGH (ref 4.0–10.5)
nRBC: 0 % (ref 0.0–0.2)
nRBC: 0 % (ref 0.0–0.2)

## 2023-09-12 LAB — COMPREHENSIVE METABOLIC PANEL
ALT: 21 U/L (ref 0–44)
AST: 23 U/L (ref 15–41)
Albumin: 3.3 g/dL — ABNORMAL LOW (ref 3.5–5.0)
Alkaline Phosphatase: 88 U/L (ref 38–126)
Anion gap: 11 (ref 5–15)
BUN: 22 mg/dL — ABNORMAL HIGH (ref 6–20)
CO2: 26 mmol/L (ref 22–32)
Calcium: 8.9 mg/dL (ref 8.9–10.3)
Chloride: 97 mmol/L — ABNORMAL LOW (ref 98–111)
Creatinine, Ser: 1.74 mg/dL — ABNORMAL HIGH (ref 0.61–1.24)
GFR, Estimated: 46 mL/min — ABNORMAL LOW (ref >60)
Glucose, Bld: 208 mg/dL — ABNORMAL HIGH (ref 70–99)
Potassium: 3.9 mmol/L (ref 3.5–5.1)
Sodium: 134 mmol/L — ABNORMAL LOW (ref 135–145)
Total Bilirubin: 0.7 mg/dL (ref <1.2)
Total Protein: 7 g/dL (ref 6.5–8.1)

## 2023-09-12 LAB — URINALYSIS, ROUTINE W REFLEX MICROSCOPIC
Bilirubin Urine: NEGATIVE
Glucose, UA: NEGATIVE mg/dL
Hgb urine dipstick: NEGATIVE
Ketones, ur: NEGATIVE mg/dL
Leukocytes,Ua: NEGATIVE
Nitrite: NEGATIVE
Protein, ur: NEGATIVE mg/dL
Specific Gravity, Urine: 1.018 (ref 1.005–1.030)
pH: 7 (ref 5.0–8.0)

## 2023-09-12 LAB — I-STAT CHEM 8, ED
BUN: 24 mg/dL — ABNORMAL HIGH (ref 6–20)
Calcium, Ion: 1.1 mmol/L — ABNORMAL LOW (ref 1.15–1.40)
Chloride: 98 mmol/L (ref 98–111)
Creatinine, Ser: 1.8 mg/dL — ABNORMAL HIGH (ref 0.61–1.24)
Glucose, Bld: 206 mg/dL — ABNORMAL HIGH (ref 70–99)
HCT: 49 % (ref 39.0–52.0)
Hemoglobin: 16.7 g/dL (ref 13.0–17.0)
Potassium: 3.8 mmol/L (ref 3.5–5.1)
Sodium: 137 mmol/L (ref 135–145)
TCO2: 25 mmol/L (ref 22–32)

## 2023-09-12 LAB — MRSA NEXT GEN BY PCR, NASAL: MRSA by PCR Next Gen: NEGATIVE — AB

## 2023-09-12 LAB — CK: Total CK: 330 U/L (ref 49–397)

## 2023-09-12 LAB — TYPE AND SCREEN
ABO/RH(D): B NEG
Antibody Screen: NEGATIVE

## 2023-09-12 LAB — HIV ANTIBODY (ROUTINE TESTING W REFLEX): HIV Screen 4th Generation wRfx: NONREACTIVE

## 2023-09-12 LAB — I-STAT CG4 LACTIC ACID, ED: Lactic Acid, Venous: 3.3 mmol/L (ref 0.5–1.9)

## 2023-09-12 LAB — ETHANOL: Alcohol, Ethyl (B): <10 mg/dL (ref <10)

## 2023-09-12 LAB — PROTIME-INR
INR: 1 (ref 0.8–1.2)
Prothrombin Time: 13.3 s (ref 11.4–15.2)

## 2023-09-12 LAB — PREPARE RBC (CROSSMATCH)

## 2023-09-12 MED ORDER — NOREPINEPHRINE 4 MG/250ML-% IV SOLN
INTRAVENOUS | Status: AC
  Filled 2023-09-12: qty 250

## 2023-09-12 MED ORDER — ALBUTEROL SULFATE (2.5 MG/3ML) 0.083% IN NEBU
3.0000 mL | INHALATION_SOLUTION | Freq: Four times a day (QID) | RESPIRATORY_TRACT | Status: DC | PRN
  Administered 2023-09-13: 3 mL via RESPIRATORY_TRACT
  Filled 2023-09-12: qty 3

## 2023-09-12 MED ORDER — FUROSEMIDE 20 MG PO TABS
20.0000 mg | ORAL_TABLET | Freq: Every day | ORAL | Status: DC
  Administered 2023-09-13: 20 mg via ORAL
  Filled 2023-09-12: qty 1

## 2023-09-12 MED ORDER — POLYETHYLENE GLYCOL 3350 17 G PO PACK: 17.0000 g | PACK | Freq: Every day | ORAL | Status: DC | PRN

## 2023-09-12 MED ORDER — ONDANSETRON HCL 4 MG/2ML IJ SOLN
4.0000 mg | Freq: Four times a day (QID) | INTRAMUSCULAR | Status: DC | PRN
  Administered 2023-09-12 – 2023-09-14 (×5): 4 mg via INTRAVENOUS
  Filled 2023-09-12 (×5): qty 2

## 2023-09-12 MED ORDER — METOPROLOL TARTRATE 5 MG/5ML IV SOLN: 5.0000 mg | Freq: Four times a day (QID) | INTRAVENOUS | Status: DC | PRN

## 2023-09-12 MED ORDER — FENTANYL CITRATE PF 50 MCG/ML IJ SOSY
50.0000 ug | PREFILLED_SYRINGE | Freq: Once | INTRAMUSCULAR | Status: AC
  Administered 2023-09-12: 50 ug via INTRAVENOUS
  Filled 2023-09-12: qty 1

## 2023-09-12 MED ORDER — PANTOPRAZOLE SODIUM 40 MG PO TBEC
40.0000 mg | DELAYED_RELEASE_TABLET | Freq: Every day | ORAL | Status: DC
  Administered 2023-09-13 – 2023-09-16 (×3): 40 mg via ORAL
  Filled 2023-09-12 (×3): qty 1

## 2023-09-12 MED ORDER — HYDROMORPHONE HCL 1 MG/ML IJ SOLN
0.5000 mg | INTRAMUSCULAR | Status: DC | PRN
  Administered 2023-09-12 – 2023-09-14 (×5): 0.5 mg via INTRAVENOUS
  Filled 2023-09-12 (×5): qty 1

## 2023-09-12 MED ORDER — ASPIRIN 81 MG PO TBEC
81.0000 mg | DELAYED_RELEASE_TABLET | Freq: Every day | ORAL | Status: DC
  Administered 2023-09-13 – 2023-09-17 (×5): 81 mg via ORAL
  Filled 2023-09-12 (×5): qty 1

## 2023-09-12 MED ORDER — NITROGLYCERIN 0.4 MG SL SUBL: 0.4000 mg | SUBLINGUAL_TABLET | SUBLINGUAL | Status: DC | PRN

## 2023-09-12 MED ORDER — SODIUM CHLORIDE 0.9% IV SOLUTION
Freq: Once | INTRAVENOUS | Status: DC
Start: 2023-09-12 — End: 2023-09-16

## 2023-09-12 MED ORDER — NOREPINEPHRINE 4 MG/250ML-% IV SOLN
2.0000 ug/min | INTRAVENOUS | Status: DC
  Administered 2023-09-12: 2 ug/min via INTRAVENOUS
  Filled 2023-09-12: qty 250

## 2023-09-12 MED ORDER — HYDRALAZINE HCL 20 MG/ML IJ SOLN: 10.0000 mg | INTRAMUSCULAR | Status: DC | PRN

## 2023-09-12 MED ORDER — PANTOPRAZOLE SODIUM 40 MG IV SOLR
40.0000 mg | Freq: Every day | INTRAVENOUS | Status: DC
  Administered 2023-09-12 – 2023-09-14 (×2): 40 mg via INTRAVENOUS
  Filled 2023-09-12 (×2): qty 10

## 2023-09-12 MED ORDER — DOCUSATE SODIUM 100 MG PO CAPS
100.0000 mg | ORAL_CAPSULE | Freq: Two times a day (BID) | ORAL | Status: DC
  Administered 2023-09-13 – 2023-09-17 (×8): 100 mg via ORAL
  Filled 2023-09-12 (×10): qty 1

## 2023-09-12 MED ORDER — CHLORHEXIDINE GLUCONATE CLOTH 2 % EX PADS
6.0000 | MEDICATED_PAD | Freq: Every day | CUTANEOUS | Status: DC
  Administered 2023-09-12 – 2023-09-16 (×5): 6 via TOPICAL

## 2023-09-12 MED ORDER — ENOXAPARIN SODIUM 30 MG/0.3ML IJ SOSY: 30.0000 mg | PREFILLED_SYRINGE | Freq: Two times a day (BID) | INTRAMUSCULAR | Status: DC

## 2023-09-12 MED ORDER — SODIUM CHLORIDE 0.9 % IV SOLN: 250.0000 mL | INTRAVENOUS | Status: AC

## 2023-09-12 MED ORDER — ONDANSETRON 4 MG PO TBDP
4.0000 mg | ORAL_TABLET | Freq: Four times a day (QID) | ORAL | Status: DC | PRN
  Administered 2023-09-13: 4 mg via ORAL
  Filled 2023-09-12 (×4): qty 1

## 2023-09-12 MED ORDER — OXYCODONE HCL 5 MG PO TABS
10.0000 mg | ORAL_TABLET | ORAL | Status: DC | PRN
  Administered 2023-09-12: 10 mg via ORAL
  Filled 2023-09-12 (×3): qty 2

## 2023-09-12 MED ORDER — METHOCARBAMOL 500 MG PO TABS
750.0000 mg | ORAL_TABLET | Freq: Three times a day (TID) | ORAL | Status: AC
  Administered 2023-09-12 – 2023-09-15 (×9): 750 mg via ORAL
  Filled 2023-09-12 (×9): qty 2

## 2023-09-12 MED ORDER — SODIUM CHLORIDE 0.9% FLUSH
10.0000 mL | Freq: Two times a day (BID) | INTRAVENOUS | Status: DC
  Administered 2023-09-12 – 2023-09-16 (×8): 10 mL
  Administered 2023-09-16: 20 mL

## 2023-09-12 MED ORDER — OXYCODONE HCL 5 MG PO TABS
5.0000 mg | ORAL_TABLET | ORAL | Status: DC | PRN
  Administered 2023-09-13 – 2023-09-17 (×11): 5 mg via ORAL
  Filled 2023-09-12 (×9): qty 1

## 2023-09-12 MED ORDER — MELATONIN 3 MG PO TABS
3.0000 mg | ORAL_TABLET | Freq: Every evening | ORAL | Status: DC | PRN
  Administered 2023-09-12 – 2023-09-16 (×4): 3 mg via ORAL
  Filled 2023-09-12 (×4): qty 1

## 2023-09-12 MED ORDER — IOHEXOL 350 MG/ML SOLN
100.0000 mL | Freq: Once | INTRAVENOUS | Status: AC | PRN
  Administered 2023-09-12: 100 mL via INTRAVENOUS

## 2023-09-12 MED ORDER — ROSUVASTATIN CALCIUM 20 MG PO TABS
20.0000 mg | ORAL_TABLET | Freq: Every day | ORAL | Status: DC
  Administered 2023-09-13 – 2023-09-17 (×5): 20 mg via ORAL
  Filled 2023-09-12 (×5): qty 1

## 2023-09-12 MED ORDER — SODIUM CHLORIDE 0.9% FLUSH: 10.0000 mL | INTRAVENOUS | Status: DC | PRN

## 2023-09-12 MED ORDER — DEXTROSE-SODIUM CHLORIDE 5-0.45 % IV SOLN
INTRAVENOUS | Status: AC
Start: 2023-09-12 — End: 2023-09-13

## 2023-09-12 MED ORDER — ACETAMINOPHEN 500 MG PO TABS
1000.0000 mg | ORAL_TABLET | Freq: Four times a day (QID) | ORAL | Status: DC
  Administered 2023-09-12 – 2023-09-17 (×17): 1000 mg via ORAL
  Filled 2023-09-12 (×19): qty 2

## 2023-09-12 MED ORDER — TETANUS-DIPHTH-ACELL PERTUSSIS 5-2.5-18.5 LF-MCG/0.5 IM SUSY
0.5000 mL | PREFILLED_SYRINGE | Freq: Once | INTRAMUSCULAR | Status: AC
  Administered 2023-09-12: 0.5 mL via INTRAMUSCULAR
  Filled 2023-09-12: qty 0.5

## 2023-09-12 MED ORDER — ORAL CARE MOUTH RINSE: 15.0000 mL | OROMUCOSAL | Status: DC | PRN
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
## 2023-09-12 NOTE — ED Notes (Signed)
Dr. Janee Morn at bedside.
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
## 2023-09-12 NOTE — Progress Notes (Signed)
Transition of Care (TOC) - CAGE-AID Screening   Patient Details  Name: Brian Gross Sr. MRN: [id number] Date of Birth: 1966-01-31  Hewitt Shorts, RN Trauma Response Nurse Phone Number: [phone] 09/12/2023, 3:30 PM    CAGE-AID Screening:    Have You Ever Felt You Ought to Cut Down on Your Drinking or Drug Use?: No Have People Annoyed You By Critizing Your Drinking Or Drug Use?: No Have You Felt Bad Or Guilty About Your Drinking Or Drug Use?: No Have You Ever Had a Drink or Used Drugs First Thing In The Morning to Steady Your Nerves or to Get Rid of a Hangover?: No CAGE-AID Score: 0  Substance Abuse Education Offered: No (No services needed. Does not drink alcohol)

## 2023-09-12 NOTE — Consult Note (Addendum)
Cardiology Consultation   Patient ID: Brian HEINIG Sr. MRN: [id number]; DOB: 21-Apr-1966  Admit date: 09/12/2023 Date of Consult: 09/12/2023  PCP:  Wanda Plump, MD   Brian Gross:  Brian Rotunda, MD   Patient Profile:   Brian JANK Sr. is a 57 y.o. male with a hx of chronic systolic heart failure/ICM, CAD with prior PCI in 2012 and CTO of RCA, HTN, HLD, obesity, and chronic granulomatosis  who is being seen 09/12/2023 for the evaluation of hypotension in the setting of acute trauma at the request of Dr. Laurell Josephs.  History of Present Illness:   Brian Gross has a history of CAD and is status post anterior STEMI 09/2011 treated with aspiration of occluded LAD and DES, CTO of RCA was noted.  EF at the time of heart catheterization was 30%.  Chronic systolic heart failure felt secondary to an ischemic cardiomyopathy.  Unfortunately he has struggled with follow-up and medication adherence due to gaps in his health insurance.  He was seen in clinic 2018 for acute dyspnea.  He underwent Lexiscan Myoview which showed a large area of anterior/anteroseptal and apical wall defect consistent with scar.  LVEF was noted 35%.  He was last seen in clinic 07/23/2022 with Dr. Antoine Poche and was maintained on aspirin, 20 mg Lasix daily, 100 mg losartan, 100 mg Toprol, 20 mg Crestor. GDMT limited by cost.   Echocardiogram was repeated and showed an LVEF mild to moderately decreased.  Patient refused Definity and poor acoustic windows limited the study.  He was noted to have a normal RV, trivial MR. He is working on weight loss.  Today, he was working at [employer] site today when he was struck and run over by the bobcat with injury to his RLE and right hip. After the accident he did stand up but reports hypotension. RLE with swelling and hematoma. On arrival to the ER he was lightheaded and dizzy, found to be hypotensive. Cardiology consulted given significant cardiac  history.  GCS 15 BP 72/40 --> 126/70 HR 50-60s Hb  sCr 1.80 (1.74) Lactic acid 3.3  CT imaging negative for solid organ injury or fracture.  He was noted to have a large subcutaneous fluid collection felt likely subcutaneous hematoma but shear injury cannot be excluded.  He is examined on room air and reports compliance with medications. He takes 20 mg lasix daily and does report intermittent diuresis with this. He denies cardiac complaints this morning prior to his accident.  He ate a [employer] biscuit this morning for breakfast.  He was working on the ground at work when a bobcat ran over his right side as described previously.  His team mates stood him up after the accident.  They decided to take him to the urgent care by POV.  Urgent care then sent him to the ER via EMS.  He received IV fentanyl for pain control with subsequent hypotension.  Hypotension seem to persist and Levophed was started to support his pressure.  Levophed has since been discontinued and he is maintaining blood pressure.  During my exam, he denies any cardiac symptoms prior to the accident.  He is not volume up on exam.  He does have swelling and edema on his right lower extremity which is expected given his injuries.   Past Medical History:  Diagnosis Date   Asthma    Borderline diabetes    CAD (coronary artery disease)    a. s/p anterior STEMI in 09/2011  with aspiration of occluded LAD with DES placed at that time, CTO of RCA noted. EF at 30%   Chest pain    CHF (congestive heart failure) (HCC)    Gout    Granulomatosis    chronic granulomatosis (dx at age 39 @ [facility])   Ischemic cardiomyopathy    a. EF at 30% by cath in 09/2011. Has refused echos in the past.    MI (myocardial infarction) (HCC)    Obesity    SOB (shortness of breath)     Past Surgical History:  Procedure Laterality Date   CHOLECYSTECTOMY     LEFT HEART CATHETERIZATION WITH CORONARY ANGIOGRAM N/A 10/18/2011   Procedure: LEFT  HEART CATHETERIZATION WITH CORONARY ANGIOGRAM;  Surgeon: Corky Crafts, MD;  Location: [facility] CATH LAB;  Service: Cardiovascular;  Laterality: N/A;   LUNG BIOPSY  1990s   PERCUTANEOUS CORONARY STENT INTERVENTION (PCI-S) N/A 10/18/2011   Procedure: PERCUTANEOUS CORONARY STENT INTERVENTION (PCI-S);  Surgeon: Corky Crafts, MD;  Location: [facility] CATH LAB;  Service: Cardiovascular;  Laterality: N/A;     Home Medications:  Prior to Admission medications   Medication Sig Start Date End Date Taking? Authorizing Provider  acetaminophen (TYLENOL) 500 MG tablet Take 500 mg by mouth every 6 (six) hours as needed for moderate pain.   Yes [provider]  albuterol (VENTOLIN HFA) 108 (90 Base) MCG/ACT inhaler Inhale 1-2 puffs into the lungs every 6 (six) hours as needed for wheezing or shortness of breath. 07/08/16  Yes Brian Rotunda, MD  aspirin EC 81 MG tablet Take 81 mg by mouth daily. Swallow whole.   Yes [provider]  furosemide (LASIX) 20 MG tablet TAKE 1 TABLET BY MOUTH EVERY DAY 09/29/22  Yes Hochrein, Fayrene Fearing, MD  losartan (COZAAR) 100 MG tablet TAKE 1 TABLET BY MOUTH EVERY DAY 01/24/23  Yes Brian Rotunda, MD  metoprolol succinate (TOPROL-XL) 100 MG 24 hr tablet TAKE 1 TABLET BY MOUTH EVERY DAY WITH OR IMMEDIATELY FOLLOWING A MEAL 04/18/23  Yes Brian Rotunda, MD  nitroGLYCERIN (NITROSTAT) 0.4 MG SL tablet Place 1 tablet (0.4 mg total) under the tongue every 5 (five) minutes as needed for chest pain. 03/09/17  Yes Strader, [place] M, PA-C  rosuvastatin (CRESTOR) 20 MG tablet TAKE 1 TABLET BY MOUTH EVERY DAY 02/23/23  Yes Brian Rotunda, MD    Inpatient Medications: Scheduled Meds:  sodium chloride   Intravenous Once   Continuous Infusions:  sodium chloride     norepinephrine     norepinephrine (LEVOPHED) Adult infusion 2 mcg/min (09/12/23 1248)   PRN Meds: norepinephrine, ondansetron **OR** ondansetron (ZOFRAN) IV  Allergies:    Allergies  Allergen Reactions    Morphine Nausea And Vomiting    Social History:   Social History   Socioeconomic History   Marital status: Married    Spouse name: Not on file   Number of children: 1   Years of education: Not on file   Highest education level: Not on file  Occupational History   Occupation: Flooring  Tobacco Use   Smoking status: Former    Current packs/day: 0.00    Types: Cigarettes    Quit date: 10/17/1989    Years since quitting: 33.9   Smokeless tobacco: Never  Vaping Use   Vaping status: Never Used  Substance and Sexual Activity   Alcohol use: Not Currently    Alcohol/week: 1.0 standard drink of alcohol    Types: 1 Cans of beer per week   Drug use: Yes  Frequency: 7.0 times per week    Types: Marijuana    Comment: daily   Sexual activity: Not on file  Other Topics Concern   Not on file  Social History Narrative            Social Determinants of Health   Financial Resource Strain: Not on file  Food Insecurity: No Food Insecurity (09/12/2023)   Hunger Vital Sign    Worried About Running Out of Food in the Last Year: Never true    Ran Out of Food in the Last Year: Never true  Transportation Needs: No Transportation Needs (09/12/2023)   PRAPARE - Administrator, Civil Service (Medical): No    Lack of Transportation (Non-Medical): No  Physical Activity: Not on file  Stress: Not on file  Social Connections: Not on file  Intimate Partner Violence: Not At Risk (09/12/2023)   Humiliation, Afraid, Rape, and Kick questionnaire    Fear of Current or Ex-Partner: No    Emotionally Abused: No    Physically Abused: No    Sexually Abused: No    Family History:    Family History  Problem Relation Age of Onset   Coronary artery disease Father 36       CABG   Heart attack Father    Healthy Mother    Colon cancer Neg Hx    Prostate cancer Neg Hx      ROS:  Please see the history of present illness.   All other ROS reviewed and negative.     Physical  Exam/Data:   Vitals:   09/12/23 1230 09/12/23 1251 09/12/23 1254 09/12/23 1257  BP: 99/73 (!) 96/56 101/68 107/75  Pulse: (!) 58 (!) 59 (!) 59 (!) 57  Resp: 11 14 14 13   Temp:      TempSrc:      SpO2: 98% 97% 96% 96%  Weight:      Height:        Intake/Output Summary (Last 24 hours) at 09/12/2023 1304 Last data filed at 09/12/2023 1252 Gross per 24 hour  Intake 315 ml  Output --  Net 315 ml      09/12/2023   11:27 AM 07/11/2023    2:55 PM 07/23/2022    8:59 AM  Last 3 Weights  Weight (lbs) 280 lb 287 lb 263 lb 12.8 oz  Weight (kg) 127.007 kg 130.182 kg 119.659 kg     Body mass index is 42.57 kg/m.  General:  Well nourished, well developed, in no acute distress HEENT: normal Neck: no JVD Vascular: No carotid bruits; Distal pulses 2+ bilaterally Cardiac:  normal S1, S2; RRR; no murmur  Lungs:  clear to auscultation bilaterally, no wheezing, rhonchi or rales  Abd: soft, nontender, no hepatomegaly  Ext: RLE with abrasions and swelling, no edema on LLE Musculoskeletal:  No deformities, BUE and BLE strength normal and equal Skin: warm and dry  Neuro:  CNs 2-12 intact, no focal abnormalities noted Psych:  Normal affect   EKG:  The EKG was personally reviewed and demonstrates: N/A Telemetry:  Telemetry was personally reviewed and demonstrates:  sinus rhythm to sinus bradycardia in the 50-60s  Relevant CV Studies:  Echo 12/02/21: 1. Poor acoustic windows limit study. Difficult to see endocardium The  mid/distal septum appear hypokinetic to akinetic; The apex appears  akinetic.Brian Gross accurately assess LVEF though function appears mild to  moderately decreased. Patient refused  Definity.. There is mild left ventricular hypertrophy. Left ventricular  diastolic parameters are  indeterminate.   2. Right ventricular systolic function is normal. The right ventricular  size is normal.   3. Left atrial size was mildly dilated.   4. The mitral valve is grossly normal. Trivial  mitral valve  regurgitation.   5. The aortic valve is tricuspid. Aortic valve regurgitation is not  visualized.   Laboratory Data:  High Sensitivity Troponin:  No results for input(s): "TROPONINIHS" in the last 720 hours.   Chemistry Recent Labs  Lab 09/12/23 1058 09/12/23 1112  NA 134* 137  K 3.9 3.8  CL 97* 98  CO2 26  --   GLUCOSE 208* 206*  BUN 22* 24*  CREATININE 1.74* 1.80*  CALCIUM 8.9  --   GFRNONAA 46*  --   ANIONGAP 11  --     Recent Labs  Lab 09/12/23 1058  PROT 7.0  ALBUMIN 3.3*  AST 23  ALT 21  ALKPHOS 88  BILITOT 0.7   Lipids No results for input(s): "CHOL", "TRIG", "HDL", "LABVLDL", "LDLCALC", "CHOLHDL" in the last 168 hours.  Hematology Recent Labs  Lab 09/12/23 1058 09/12/23 1112  WBC 24.8*  --   RBC 4.87  --   HGB 14.7 16.7  HCT 45.6 49.0  MCV 93.6  --   MCH 30.2  --   MCHC 32.2  --   RDW 13.0  --   PLT 269  --    Thyroid No results for input(s): "TSH", "FREET4" in the last 168 hours.  BNPNo results for input(s): "BNP", "PROBNP" in the last 168 hours.  DDimer No results for input(s): "DDIMER" in the last 168 hours.   Radiology/Studies:  [place] EKG SITE RITE  Result Date: 09/12/2023 If Site Rite image not attached, placement could not be confirmed due to current cardiac rhythm.  DG Foot Complete Right  Result Date: 09/12/2023 CLINICAL DATA:  Right knee and foot pain and abrasions. Run over by a Bobcat machine. EXAM: RIGHT FOOT COMPLETE - 3+ VIEW COMPARISON:  None Available. FINDINGS: The soft tissue swelling. Mild-to-moderate dorsal tarsal enthesophyte formation with mild fragmentation. Moderate-sized calcaneal enthesophyte formation. Minimal 1st MTP joint degenerative changes. No fracture or dislocation seen. IMPRESSION: 1. Soft tissue swelling without fracture. 2. Tarsal enthesophytes. Electronically Signed   By: Beckie Salts M.D.   On: 09/12/2023 12:27   DG Tibia/Fibula Right Port  Result Date: 09/12/2023 CLINICAL DATA:  Right knee  and foot bruising and abrasions. Run over by a Bobcat machine. EXAM: PORTABLE RIGHT TIBIA AND FIBULA - 2 VIEW COMPARISON:  None Available. FINDINGS: Subcutaneous edema.  No fracture or dislocation. IMPRESSION: Subcutaneous edema. No fracture. Electronically Signed   By: Beckie Salts M.D.   On: 09/12/2023 12:23   DG Knee Right Port  Result Date: 09/12/2023 CLINICAL DATA:  Right knee pain. Run over by a Bobcat machine. EXAM: PORTABLE RIGHT KNEE - 1-2 VIEW COMPARISON:  None Available. FINDINGS: Diffuse subcutaneous edema. Mild medial and minimal lateral spur formation. No fracture, dislocation or effusion. IMPRESSION: 1. Diffuse subcutaneous edema. 2. No fracture. 3. Mild medial and minimal lateral degenerative changes. Electronically Signed   By: Beckie Salts M.D.   On: 09/12/2023 12:22   DG Chest Port 1 View  Result Date: 09/12/2023 CLINICAL DATA:  Run over by a Bobcat machine. EXAM: PORTABLE CHEST 1 VIEW COMPARISON:  Chest CT obtained today. FINDINGS: Borderline enlarged cardiac silhouette. Mildly prominent pulmonary vasculature. Surgical staple lines in the right lower lung laterally. Small amount of right pleural thickening or fluid. No fracture or pneumothorax  seen. IMPRESSION: 1. Borderline cardiomegaly and mild pulmonary vascular congestion. 2. Small amount of right pleural thickening or fluid. Electronically Signed   By: Beckie Salts M.D.   On: 09/12/2023 12:21   CT FEMUR RIGHT W CONTRAST  Result Date: 09/12/2023 CLINICAL DATA:  Motor vehicle accident, leg trauma EXAM: CT OF THE LOWER RIGHT EXTREMITY WITH CONTRAST TECHNIQUE: Multidetector CT imaging of the lower right extremity was performed according to the standard protocol following intravenous contrast administration. RADIATION DOSE REDUCTION: This exam was performed according to the departmental dose-optimization program which includes automated exposure control, adjustment of the mA and/or kV according to patient size and/or use of  iterative reconstruction technique. CONTRAST:  OMNIPAQUE IOHEXOL 350 MG/ML SOLN COMPARISON:  Pelvic radiograph and CT from 09/12/2023 FINDINGS: Bones/Joint/Cartilage Small knee joint effusion. No femur fracture. Medial compartmental narrowing in the knee joint with marginal spurring. Potential free osteochondral fragment posteriorly in the knee joint, 5 mm in diameter on image 50 of series 14. Ligaments Suboptimally assessed by CT. Muscles and Tendons Unremarkable Soft tissues Mild subcutaneous edema lateral to the right hip. At the mid femoral level, there is a collection of subcutaneous fluid partially tracking along the superficial fascial margin measuring about 6.6 by 2.5 by 10.8 cm (volume = 93 cm^3), favoring localized subcutaneous hematoma. Strictly speaking Morel-Lavalle type shearing injury cannot be totally excluded. Patchy infiltrative hematoma/bruising noted medial to the knee and posteromedial to the distal femur. There is also some mild thickening of the superficial fascia margin anteriorly along the distal femur probably representing a small amount of blood products. No vascular irregularity or stenosis in the common femoral artery, femoral artery, or popliteal artery. Enlarged 1.3 cm right external iliac node, image 10 series 11. IMPRESSION: 1. At the mid femoral level, there is a collection of subcutaneous fluid partially tracking along the superficial fascial margin measuring about 6.6 by 2.5 by 10.8 cm, favoring localized subcutaneous hematoma. Strictly speaking Morel-Lavallee type shearing injury cannot be totally excluded. 2. Patchy infiltrative hematoma/bruising noted medial to the knee and posteromedial to the distal femur. There is also some mild thickening of the superficial fascia margin anteriorly along the distal femur probably representing a small amount of blood products. 3. Small knee joint effusion. Potential free osteochondral fragment posteriorly in the knee joint, 5 mm in  diameter. 4. Medial compartmental narrowing in the knee joint with marginal spurring. 5. Enlarged 1.3 cm right external iliac node, nonspecific but probably reactive. Electronically Signed   By: Gaylyn Rong M.D.   On: 09/12/2023 11:59   CT CHEST ABDOMEN PELVIS W CONTRAST  Result Date: 09/12/2023 CLINICAL DATA:  Motor vehicle accident, blunt trauma EXAM: CT CHEST, ABDOMEN, AND PELVIS WITH CONTRAST TECHNIQUE: Multidetector CT imaging of the chest, abdomen and pelvis was performed following the standard protocol during bolus administration of intravenous contrast. RADIATION DOSE REDUCTION: This exam was performed according to the departmental dose-optimization program which includes automated exposure control, adjustment of the mA and/or kV according to patient size and/or use of iterative reconstruction technique. CONTRAST:  100 cc Omnipaque COMPARISON:  None Available. FINDINGS: CT CHEST FINDINGS Cardiovascular: No aortic dissection or transsection. No mediastinal hematoma. No pericardial fluid. Coronary artery calcification and aortic atherosclerotic calcification. Mediastinum/Nodes: Trachea and esophagus normal. No mediastinal hematoma. Some haziness in the paratracheal fat at the level the carina is nonspecific. Lungs/Pleura: No pneumothorax. No pleural fluid. No pulmonary contusion evident. Musculoskeletal: No sternal fracture.  No acute rib fracture. Remote rib fracture of the RIGHT anterior seventh  rib with callus formation CT ABDOMEN AND PELVIS FINDINGS Hepatobiliary: No hepatic laceration.  Pancreas normal. Pancreas: Pancreas is normal. No ductal dilatation. No pancreatic inflammation. Spleen: No splenic laceration. Adrenals/urinary tract: Adrenal glands normal. Kidneys enhance symmetrically. Nonobstructing calculus lower pole of the LEFT kidney. Bladder is intact. Stomach/Bowel: No bowel injury.  No mesenteric fluid. Vascular/Lymphatic: No evidence abdominal aortic injury. Iliac arteries are  normal. Reproductive: Unremarkable Other: No free fluid Musculoskeletal: No pelvic fracture.  No spine fracture. IMPRESSION: CHEST: 1. No evidence of thoracic aortic injury. 2. No pneumothorax or pulmonary contusion. 3. No rib fracture. Remote RIGHT anterior seventh rib fracture. PELVIS: 1. No evidence of solid organ injury. 2. No pelvic fracture. 3.  Aortic Atherosclerosis (ICD10-I70.0). Electronically Signed   By: Genevive Bi M.D.   On: 09/12/2023 11:43   DG Pelvis Portable  Result Date: 09/12/2023 CLINICAL DATA:  Trauma EXAM: PORTABLE PELVIS 1-2 VIEWS COMPARISON:  None Available. FINDINGS: No pelvic fracture or diastasis. No evidence of hip dislocation on this single frontal view. No focal osseous lesions. No significant hip arthropathy. IMPRESSION: No pelvic fracture. Electronically Signed   By: Delbert Phenix M.D.   On: 09/12/2023 11:16     Assessment and Plan:   Hypotension - in the setting of traumatic RLE injury with [place] hematorma  - fortunately, H/H stable - thankfully BP has stabilized after brief levophed, now on  hold -I do not suspect he is in cardiogenic shock - He does not appear volume up on exam - I suspect the combination of fentanyl, bradycardia, and trauma likely precipitated his hypotension -Agree with only gentle fluids-NS running at 10 mL/hour   Chronic systolic heart failure Hypertension - LVEF near 35% - he has refused definity in the past (2023), echo with poor windows - GDMT: toprol, losartan - HTN managed in the context of CHF -Hold 100 mg of Toprol and 100 mg of losartan - Would hold further diuresis today - We will continue to follow  CAD - hx of PCI-LAD 2012, CTO-RCA - nuclear stress test in 2018 with scar - continue ASA - no chest pain  AKI - sCr 1.74, baseline 1.05 - expected AKI, continue to trend  Hyperlipidemia with LDL goal < 70 No recent lipid panel -  maintained on 20 mg lipitor - needs updated lipid panel in OP setting  Risk  Assessment/Risk Scores:   For questions or updates, please contact [facility] Please consult [email or web address] for contact info under   Signed, Marcelino Duster, PA  09/12/2023 1:04 PM  Patient seen and examined, note reviewed with the signed Advanced Practice Provider. I personally reviewed laboratory data, imaging studies and relevant notes. I independently examined the patient and formulated the important aspects of the plan. I have personally discussed the plan with the patient and/or family. Comments or changes to the note/plan are indicated below.  He back on the levophed due to SBP going back down less than 100.  His clinical exam is not consistent with cardiogenic shock. Please hold all blood pressure lowering medications.  He will benefit from an echo - he is agreeable for this.  We will continue to follow with you   Thomasene Ripple DO, MS [facility] Attending [facility]  [address] [place], [place] [address] [phone] Website: [email or web address]

## 2023-09-12 NOTE — ED Notes (Addendum)
Trauma Response Nurse Documentation   Treston E Waseem Delucchi. is a 57 y.o. male arriving to [facility] ED via EMS  On No antithrombotic. Trauma was activated as a Level 1 by Dr. Theresia Lo based on the following trauma criteria Anytime Systolic Blood Pressure < 90.  Patient cleared for CT by Dr. Janee Morn. Pt transported to CT with trauma response nurse present to monitor. RN remained with the patient throughout their absence from the department for clinical observation.   GCS 15.  History   Past Medical History:  Diagnosis Date   Asthma    Borderline diabetes    CAD (coronary artery disease)    a. s/p anterior STEMI in 09/2011 with aspiration of occluded LAD with DES placed at that time, CTO of RCA noted. 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 EF at 30% by cath in 09/2011. Has refused echos in the past.    MI (myocardial infarction) (HCC)    Obesity    SOB (shortness of breath)      Past Surgical History:  Procedure Laterality Date   CHOLECYSTECTOMY     LEFT HEART CATHETERIZATION WITH CORONARY ANGIOGRAM N/A 10/18/2011   Procedure: LEFT HEART CATHETERIZATION WITH CORONARY ANGIOGRAM;  Surgeon: Corky Crafts, MD;  Location: [facility] CATH LAB;  Service: Cardiovascular;  Laterality: N/A;   LUNG BIOPSY  1990s   PERCUTANEOUS CORONARY STENT INTERVENTION (PCI-S) N/A 10/18/2011   Procedure: PERCUTANEOUS CORONARY STENT INTERVENTION (PCI-S);  Surgeon: Corky Crafts, MD;  Location: [facility] CATH LAB;  Service: Cardiovascular;  Laterality: N/A;       Initial Focused Assessment (If applicable, or please see trauma documentation):  Airway - Clear Breathing - unlabored, has a little difficulty when laying flat.  Circulation -- crush injury to right leg from a skid steer running over right leg-  Pedal pulse present on palpation on arrival  GCS - 15   CT's  Completed:   CT Chest w/ contrast and CT abdomen/pelvis w/ contrast   Interventions:   Labs Xrays CT scans Blood - 1 UPRBCs  Plan for disposition:  Admission to Progressive Care   Consults completed:  Orthopaedic Surgeon at [address] at bedside.  Event Summary:  TO ED via [facility], was at work and had right leg run over by a skidsteer at work, leg swelling, positive Pulse in right foot- palpated.   Initially a nonactivated trauma- after first manual BP, became Level 1 trauma-      Hewitt Shorts  Trauma Response RN  Please call TRN at [phone] for further assistance.

## 2023-09-12 NOTE — ED Notes (Signed)
Kingsley DO at bedside.

## 2023-09-12 NOTE — Progress Notes (Signed)
Patient ID: Brian Councilman., male   DOB: January 29, 1966, 57 y.o.   MRN: [id number] SBP 80s. There is no evidence of significant blood loss. Suspect cardiogenic. I have consulted Cardiology. Will change admit to ICU. Low dose levophed for now. I spoke with Brian Gross.  Violeta Gelinas, MD, MPH, FACS Please use [email or web address] to contact on call provider

## 2023-09-12 NOTE — Progress Notes (Signed)
Pt refused any ambulatory activities this shift, Dr. Janee Morn informed via text.

## 2023-09-12 NOTE — ED Notes (Signed)
Pt reports can feel pressure when lower R leg is touched, but cannot feel sensation. States this feels the same as when he arrived.
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
## 2023-09-12 NOTE — Progress Notes (Signed)
Peripherally Inserted Central Catheter Placement  The IV Nurse has discussed with the patient and/or persons authorized to consent for the patient, the purpose of this procedure and the potential benefits and risks involved with this procedure.  The benefits include less needle sticks, lab draws from the catheter, and the patient may be discharged home with the catheter. Risks include, but not limited to, infection, bleeding, blood clot (thrombus formation), and puncture of an artery; nerve damage and irregular heartbeat and possibility to perform a PICC exchange if needed/ordered by physician.  Alternatives to this procedure were also discussed.  Bard Power PICC patient education guide, fact sheet on infection prevention and patient information card has been provided to patient /or left at bedside.    PICC Placement Documentation  PICC Double Lumen 09/12/23 Right Basilic 44 cm 2 cm (Active)  Indication for Insertion or Continuance of Line Vasoactive infusions 09/12/23 1525  Exposed Catheter (cm) 2 cm 09/12/23 1525  Site Assessment Clean, Dry, Intact 09/12/23 1525  Lumen #1 Status Flushed;Saline locked;Blood return noted 09/12/23 1525  Lumen #2 Status Flushed;Saline locked;Blood return noted 09/12/23 1525  Dressing Type Securing device;Transparent 09/12/23 1525  Dressing Status Antimicrobial disc in place 09/12/23 1525  Line Care Connections checked and tightened 09/12/23 1525  Line Adjustment (NICU/IV Team Only) No 09/12/23 1525  Dressing Intervention New dressing;Adhesive placed at insertion site (IV team only) 09/12/23 1525       Elenore Paddy 09/12/2023, 3:26 PM
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
## 2023-09-12 NOTE — Plan of Care (Signed)
  Problem: Education: Goal: Knowledge of General Education information will improve Description: Including pain rating scale, medication(s)/side effects and non-pharmacologic comfort measures Outcome: Progressing   Problem: Health Behavior/Discharge Planning: Goal: Ability to manage health-related needs will improve Outcome: Progressing   Problem: Clinical Measurements: Goal: Cardiovascular complication will be avoided Outcome: Progressing   Problem: Activity: Goal: Risk for activity intolerance will decrease Outcome: Progressing   Problem: Pain Management: Goal: General experience of comfort will improve Outcome: Progressing

## 2023-09-13 ENCOUNTER — Inpatient Hospital Stay (HOSPITAL_COMMUNITY): Payer: Commercial Managed Care - HMO

## 2023-09-13 DIAGNOSIS — I5022 Chronic systolic (congestive) heart failure: Secondary | ICD-10-CM

## 2023-09-13 DIAGNOSIS — I959 Hypotension, unspecified: Secondary | ICD-10-CM | POA: Diagnosis not present

## 2023-09-13 DIAGNOSIS — S8011XA Contusion of right lower leg, initial encounter: Secondary | ICD-10-CM | POA: Diagnosis not present

## 2023-09-13 LAB — TYPE AND SCREEN
ABO/RH(D): B NEG
Antibody Screen: NEGATIVE
Unit division: 0

## 2023-09-13 LAB — CBC
HCT: 39.9 % (ref 39.0–52.0)
Hemoglobin: 13 g/dL (ref 13.0–17.0)
MCH: 30.2 pg (ref 26.0–34.0)
MCHC: 32.6 g/dL (ref 30.0–36.0)
MCV: 92.6 fL (ref 80.0–100.0)
Platelets: 189 10*3/uL (ref 150–400)
RBC: 4.31 MIL/uL (ref 4.22–5.81)
RDW: 14 % (ref 11.5–15.5)
WBC: 8.5 10*3/uL (ref 4.0–10.5)
nRBC: 0 % (ref 0.0–0.2)

## 2023-09-13 LAB — BASIC METABOLIC PANEL
Anion gap: 7 (ref 5–15)
BUN: 22 mg/dL — ABNORMAL HIGH (ref 6–20)
CO2: 28 mmol/L (ref 22–32)
Calcium: 8.6 mg/dL — ABNORMAL LOW (ref 8.9–10.3)
Chloride: 102 mmol/L (ref 98–111)
Creatinine, Ser: 1.62 mg/dL — ABNORMAL HIGH (ref 0.61–1.24)
GFR, Estimated: 50 mL/min — ABNORMAL LOW (ref >60)
Glucose, Bld: 137 mg/dL — ABNORMAL HIGH (ref 70–99)
Potassium: 4.1 mmol/L (ref 3.5–5.1)
Sodium: 137 mmol/L (ref 135–145)

## 2023-09-13 LAB — ECHOCARDIOGRAM COMPLETE
Area-P 1/2: 2.99 cm2
Height: 68 in
S' Lateral: 3.3 cm
Weight: 4480 [oz_av]

## 2023-09-13 LAB — BPAM RBC
Blood Product Expiration Date: 202412062359
ISSUE DATE / TIME: 202411251051
Unit Type and Rh: 5100

## 2023-09-13 MED ORDER — ENOXAPARIN SODIUM 40 MG/0.4ML IJ SOSY
40.0000 mg | PREFILLED_SYRINGE | Freq: Two times a day (BID) | INTRAMUSCULAR | Status: DC
  Administered 2023-09-14 – 2023-09-16 (×6): 40 mg via SUBCUTANEOUS
  Filled 2023-09-13 (×6): qty 0.4

## 2023-09-13 MED ORDER — MIDODRINE HCL 5 MG PO TABS
5.0000 mg | ORAL_TABLET | Freq: Two times a day (BID) | ORAL | Status: DC
  Administered 2023-09-13 – 2023-09-14 (×2): 5 mg via ORAL
  Filled 2023-09-13 (×2): qty 1

## 2023-09-13 MED ORDER — PERFLUTREN LIPID MICROSPHERE
1.0000 mL | INTRAVENOUS | Status: AC | PRN
Start: 2023-09-13 — End: 2023-09-13
  Administered 2023-09-13: 3 mL via INTRAVENOUS

## 2023-09-13 NOTE — Progress Notes (Signed)
Echo attempted. Pt would like to do echo later. Will return as schedule permits.
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
## 2023-09-14 DIAGNOSIS — I959 Hypotension, unspecified: Secondary | ICD-10-CM | POA: Diagnosis not present

## 2023-09-14 DIAGNOSIS — S8011XA Contusion of right lower leg, initial encounter: Secondary | ICD-10-CM | POA: Diagnosis not present

## 2023-09-14 DIAGNOSIS — I5022 Chronic systolic (congestive) heart failure: Secondary | ICD-10-CM | POA: Diagnosis not present

## 2023-09-14 LAB — CBC
HCT: 37.5 % — ABNORMAL LOW (ref 39.0–52.0)
Hemoglobin: 12.1 g/dL — ABNORMAL LOW (ref 13.0–17.0)
MCH: 30.3 pg (ref 26.0–34.0)
MCHC: 32.3 g/dL (ref 30.0–36.0)
MCV: 94 fL (ref 80.0–100.0)
Platelets: 143 10*3/uL — ABNORMAL LOW (ref 150–400)
RBC: 3.99 MIL/uL — ABNORMAL LOW (ref 4.22–5.81)
RDW: 13.5 % (ref 11.5–15.5)
WBC: 9.2 10*3/uL (ref 4.0–10.5)
nRBC: 0 % (ref 0.0–0.2)

## 2023-09-14 LAB — BASIC METABOLIC PANEL
Anion gap: 8 (ref 5–15)
BUN: 22 mg/dL — ABNORMAL HIGH (ref 6–20)
CO2: 26 mmol/L (ref 22–32)
Calcium: 8.4 mg/dL — ABNORMAL LOW (ref 8.9–10.3)
Chloride: 101 mmol/L (ref 98–111)
Creatinine, Ser: 1.35 mg/dL — ABNORMAL HIGH (ref 0.61–1.24)
GFR, Estimated: >60 mL/min (ref >60)
Glucose, Bld: 119 mg/dL — ABNORMAL HIGH (ref 70–99)
Potassium: 4.2 mmol/L (ref 3.5–5.1)
Sodium: 135 mmol/L (ref 135–145)

## 2023-09-14 MED ORDER — MIDODRINE HCL 5 MG PO TABS
5.0000 mg | ORAL_TABLET | Freq: Three times a day (TID) | ORAL | Status: DC
  Administered 2023-09-14 – 2023-09-17 (×9): 5 mg via ORAL
  Filled 2023-09-14 (×9): qty 1

## 2023-09-14 MED ORDER — FUROSEMIDE 10 MG/ML IJ SOLN: 40.0000 mg | Freq: Every day | INTRAMUSCULAR | Status: DC
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
## 2023-09-14 NOTE — Progress Notes (Signed)
Ortho Trauma Note  Patient seen and examined.  Patient not have any pain at rest but when he tries to get up and move the leg he is having a lot of pain basically through the whole thigh and lower extremity.  Notes that he has numbness throughout the whole thigh.  Has mobilized with physical therapy but still little bit frustrated about his progress.  Physical exam: Some ecchymosis and bruising through the lower extremity no signs of any skin necrosis.  He does have what appears to be a Little Ishikawa lesion over the posterior lateral aspect of his thigh.  His lower extremity leg is soft and compressible and compartments are soft as well.  He is able to actively dorsiflex and plantarflex his foot and ankle.  Assessment/plan: 57 year old male status post crush injury to the right lower extremity.  Looks like he has a Systems developer lesion to the right thigh.  I do not feel any indication for surgical management at this time.  There are no signs of any skin necrosis.  Continue conservative management.  At this time I do not have any surgical indications.  The only surgical indications I would see if the Little Ishikawa lesion started to necrosis the skin or if he had other signs of skin necrosis.  I think he is outside the window of developing compartment syndrome and is no signs currently.  Please reengage orthopedics if there is any further concern.  Patient may follow-up with me on an outpatient basis.  Roby Lofts, MD [facility] [phone] (office) [email or web address]

## 2023-09-14 NOTE — Progress Notes (Addendum)
Patient ID: Brian E. Aryaman Nimz., male DOB: 1965/12/31, 56yo MRN: [id number]  Patient can be classified as having Severe Obesity: Obesity Class III for BMI > 40 (42.57).  Donata Duff, MD [facility]
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
## 2023-09-15 DIAGNOSIS — I959 Hypotension, unspecified: Secondary | ICD-10-CM | POA: Diagnosis not present

## 2023-09-15 LAB — CBC
HCT: 36.2 % — ABNORMAL LOW (ref 39.0–52.0)
Hemoglobin: 11.6 g/dL — ABNORMAL LOW (ref 13.0–17.0)
MCH: 30.3 pg (ref 26.0–34.0)
MCHC: 32 g/dL (ref 30.0–36.0)
MCV: 94.5 fL (ref 80.0–100.0)
Platelets: 132 10*3/uL — ABNORMAL LOW (ref 150–400)
RBC: 3.83 MIL/uL — ABNORMAL LOW (ref 4.22–5.81)
RDW: 13.3 % (ref 11.5–15.5)
WBC: 8.5 10*3/uL (ref 4.0–10.5)
nRBC: 0 % (ref 0.0–0.2)

## 2023-09-15 LAB — BASIC METABOLIC PANEL
Anion gap: 5 (ref 5–15)
BUN: 15 mg/dL (ref 6–20)
CO2: 30 mmol/L (ref 22–32)
Calcium: 8.5 mg/dL — ABNORMAL LOW (ref 8.9–10.3)
Chloride: 98 mmol/L (ref 98–111)
Creatinine, Ser: 1.25 mg/dL — ABNORMAL HIGH (ref 0.61–1.24)
GFR, Estimated: >60 mL/min (ref >60)
Glucose, Bld: 112 mg/dL — ABNORMAL HIGH (ref 70–99)
Potassium: 3.9 mmol/L (ref 3.5–5.1)
Sodium: 133 mmol/L — ABNORMAL LOW (ref 135–145)

## 2023-09-15 NOTE — Progress Notes (Signed)
Physical Therapy Treatment Patient Details Name: Brian HUENEFELD Sr. MRN: [id number] DOB: 1966/04/28 Today's Date: 09/15/2023   History of Present Illness 57 y.o. male s/p RLE injury after being run over by Medical sales representative. Ortho recommended WBAT.  PMH positive for CAD with STEMI s/p PCI with DES to LAD EF 30% (2012), CHF, granulomatosis, gout, asthma.    PT Comments  Pt received in supine and agreeable to session. Pt continues to report concerns about RLE WB, so education provided on importance of mobility and increasing WB tolerance with pt verbalizing understanding. Pt able to tolerate significantly increased gait distance this session with pt motivated to progress. Pt reports BUE fatigue due to heavy reliance on RW support, however is able to demonstrate slightly improved RLE WB tolerance with progressed distance. Pt encouraged to increase mobility with nursing throughout the day. Pt continues to benefit from PT services to progress toward functional mobility goals.     If plan is discharge home, recommend the following: A little help with walking and/or transfers;Assistance with cooking/housework;Assist for transportation;Help with stairs or ramp for entrance;A little help with bathing/dressing/bathroom   Can travel by private vehicle        Equipment Recommendations  Rolling walker (2 wheels);BSC/3in1    Recommendations for Other Services       Precautions / Restrictions Precautions Precautions: Fall Restrictions Weight Bearing Restrictions: No RLE Weight Bearing: Weight bearing as tolerated     Mobility  Bed Mobility Overal bed mobility: Needs Assistance Bed Mobility: Supine to Sit     Supine to sit: HOB elevated, Supervision     General bed mobility comments: increased time due to RLE pain    Transfers Overall transfer level: Needs assistance Equipment used: Rolling walker (2 wheels) Transfers: Sit to/from Stand Sit to Stand: Contact guard assist            General transfer comment: increased time to reach full upright posture, but no physical assist needed    Ambulation/Gait Ambulation/Gait assistance: Contact guard assist Gait Distance (Feet): 150 Feet Assistive device: Rolling walker (2 wheels) Gait Pattern/deviations: Step-to pattern, Decreased stride length, Antalgic, Step-through pattern, Trunk flexed     Pre-gait activities: weight shifting and static marching at EOB General Gait Details: Pt demonstrating very antalgic step-to pattern with little RLE WB tolerance, but is able to progress to step-through pattern with improved WB tolerance with increased distance. Heavy reliance on BUE to offload RLE and cues for upright posture.      Balance Overall balance assessment: Needs assistance Sitting-balance support: No upper extremity supported, Feet supported Sitting balance-Leahy Scale: Good Sitting balance - Comments: sitting EOB   Standing balance support: Bilateral upper extremity supported, During functional activity, Reliant on assistive device for balance Standing balance-Leahy Scale: Fair Standing balance comment: with RW support                            Cognition Arousal: Alert Behavior During Therapy: Anxious Overall Cognitive Status: Within Functional Limits for tasks assessed                                          Exercises      General Comments General comments (skin integrity, edema, etc.): VSS. HR up to 110's and SpO2 as low as 90% on RA during ambulation      Pertinent Vitals/Pain Pain  Assessment Pain Assessment: Faces Faces Pain Scale: Hurts even more Pain Location: RLE during bed mobility and WBing Pain Descriptors / Indicators: Grimacing, Discomfort, Aching Pain Intervention(s): Monitored during session, Repositioned     PT Goals (current goals can now be found in the care plan section) Acute Rehab PT Goals Patient Stated Goal: return to independent PT Goal  Formulation: With patient/family Time For Goal Achievement: 09/27/23 Progress towards PT goals: Progressing toward goals    Frequency    Min 1X/week       AM-PAC PT "6 Clicks" Mobility   Outcome Measure  Help needed turning from your back to your side while in a flat bed without using bedrails?: None Help needed moving from lying on your back to sitting on the side of a flat bed without using bedrails?: None Help needed moving to and from a bed to a chair (including a wheelchair)?: A Little Help needed standing up from a chair using your arms (e.g., wheelchair or bedside chair)?: A Little Help needed to walk in hospital room?: A Little Help needed climbing 3-5 steps with a railing? : A Lot 6 Click Score: 19    End of Session Equipment Utilized During Treatment: Gait belt Activity Tolerance: Patient tolerated treatment well Patient left: with family/visitor present;in chair;with call bell/phone within reach Nurse Communication: Mobility status PT Visit Diagnosis: Difficulty in walking, not elsewhere classified (R26.2);Pain     Time: 4098-1191 PT Time Calculation (min) (ACUTE ONLY): 32 min  Charges:    $Gait Training: 23-37 mins PT General Charges $$ ACUTE PT VISIT: 1 Visit                     Johny Shock, PTA Acute Rehabilitation Services Secure Chat Preferred  [phone]    Johny Shock 09/15/2023, 10:09 AM

## 2023-09-15 NOTE — Progress Notes (Signed)
Progress Note     Subjective: Ambulated with PT yesterday. Still has numbness in R thigh. Pain controlled. Had some nausea/vomiting overnight, improved this morning.   Objective: Vital signs in last 24 hours: Temp:  [97.9 F (36.6 C)-98.9 F (37.2 C)] 98.3 F (36.8 C) (11/28 0400) Pulse Rate:  [75-98] 85 (11/28 0900) Resp:  [8-20] 11 (11/28 0900) BP: (100-131)/(61-93) 108/78 (11/28 0900) SpO2:  [89 %-98 %] 97 % (11/28 0900) Last BM Date :  (pta)  Intake/Output from previous day: 11/27 0701 - 11/28 0700 In: 210 [P.O.:200; I.V.:10] Out: 1078 [Urine:1076; Emesis/NG output:2] Intake/Output this shift: No intake/output data recorded.  Physical Exam:  General: NAD HEENT: head is normocephalic, atraumatic Heart: RRR Lungs: NWOB on room air Abd: soft, NT MS: RLE with edema and ecchymosis of right posteromedial thigh through right ankle/foot, abrasions to RLE, R foot NVI. Insensate to light touch R upper lateral thigh, sensation intact to pressure.  Skin: scattered abrasions, no rash Psych: A&Ox3 with an appropriate affect.   Lab Results:  I personally reviewed all labs for past 24h   Studies/Results: ECHOCARDIOGRAM COMPLETE  Result Date: 09/13/2023    ECHOCARDIOGRAM REPORT   Patient Name:   Brian PINK Sr. Date of Exam: 09/13/2023 Medical Rec #:  [id number]          Height:       68.0 in Accession #:    [id number]         Weight:       280.0 lb Date of Birth:  09/20/1966         BSA:          2.358 m Patient Age:    56 years           BP:           114/69 mmHg Patient Gender: M                  HR:           64 bpm. Exam Location:  Inpatient Procedure: 2D Echo, Cardiac Doppler, Color Doppler and Intracardiac            Opacification Agent Indications:    CHF  History:        Patient has prior history of Echocardiogram examinations, most                 recent 12/02/2021. Cardiomyopathy, Previous Myocardial Infarction                 and CAD, Signs/Symptoms:Shortness of  Breath,                 Dizziness/Lightheadedness and Hypotension; Risk Factors:Family                 History of Coronary Artery Disease.  Sonographer:    Milda Smart Referring Phys: [id number] ANGELA NICOLE DUKE  Sonographer Comments: Patient is obese. Image acquisition challenging due to patient body habitus. IMPRESSIONS  1. The LV endocardium is not seen well even with definity. There distal anteroseptal wall and apex appear akinetic. Marland Kitchen Left ventricular ejection fraction, by estimation, is 40 to 45%. The left ventricle has mildly decreased function. The left ventricle demonstrates regional wall motion abnormalities (see scoring diagram/findings for description). Left ventricular diastolic parameters were normal.  2. Right ventricular systolic function is normal. The right ventricular size is normal.  3. The mitral valve is normal in structure. No evidence of mitral valve regurgitation. No evidence of  mitral stenosis.  4. The aortic valve is normal in structure. Aortic valve regurgitation is not visualized. No aortic stenosis is present.  5. The inferior vena cava is normal in size with greater than 50% respiratory variability, suggesting right atrial pressure of 3 mmHg. FINDINGS  Left Ventricle: The LV endocardium is not seen well even with definity. There distal anteroseptal wall and apex appear akinetic. Left ventricular ejection fraction, by estimation, is 40 to 45%. The left ventricle has mildly decreased function. The left ventricle demonstrates regional wall motion abnormalities. Definity contrast agent was given IV to delineate the left ventricular endocardial borders. The left ventricular internal cavity size was normal in size. There is no left ventricular hypertrophy.  Left ventricular diastolic parameters were normal.  LV Wall Scoring: The apical septal segment is akinetic. The apex is hypokinetic. Right Ventricle: The right ventricular size is normal. No increase in right ventricular wall thickness.  Right ventricular systolic function is normal. Left Atrium: Left atrial size was normal in size. Right Atrium: Right atrial size was normal in size. Pericardium: There is no evidence of pericardial effusion. Mitral Valve: The mitral valve is normal in structure. No evidence of mitral valve regurgitation. No evidence of mitral valve stenosis. Tricuspid Valve: The tricuspid valve is normal in structure. Tricuspid valve regurgitation is trivial. No evidence of tricuspid stenosis. Aortic Valve: The aortic valve is normal in structure. Aortic valve regurgitation is not visualized. No aortic stenosis is present. Pulmonic Valve: The pulmonic valve was normal in structure. Pulmonic valve regurgitation is not visualized. No evidence of pulmonic stenosis. Aorta: The aortic root is normal in size and structure. Venous: The inferior vena cava is normal in size with greater than 50% respiratory variability, suggesting right atrial pressure of 3 mmHg. IAS/Shunts: No atrial level shunt detected by color flow Doppler.  LEFT VENTRICLE PLAX 2D LVIDd:         4.60 cm   Diastology LVIDs:         3.30 cm   LV e' medial:    5.98 cm/s LV PW:         0.90 cm   LV E/e' medial:  15.2 LV IVS:        0.80 cm   LV e' lateral:   8.05 cm/s LVOT diam:     2.20 cm   LV E/e' lateral: 11.3 LV SV:         82 LV SV Index:   35 LVOT Area:     3.80 cm  RIGHT VENTRICLE             IVC RV S prime:     12.60 cm/s  IVC diam: 1.50 cm TAPSE (M-mode): 1.9 cm LEFT ATRIUM           Index        RIGHT ATRIUM           Index LA diam:      3.50 cm 1.48 cm/m   RA Area:     15.10 cm LA Vol (A4C): 44.2 ml 18.75 ml/m  RA Volume:   37.50 ml  15.90 ml/m  AORTIC VALVE LVOT Vmax:   99.00 cm/s LVOT Vmean:  69.600 cm/s LVOT VTI:    0.217 m  AORTA Ao Root diam: 3.00 cm Ao Asc diam:  3.10 cm MITRAL VALVE MV Area (PHT): 2.99 cm    SHUNTS MV Decel Time: 254 msec    Systemic VTI:  0.22 m MV E velocity: 90.70 cm/s  Systemic  Diam: 2.20 cm MV A velocity: 63.40 cm/s MV E/A  ratio:  1.43 Arvilla Meres MD Electronically signed by Arvilla Meres MD Signature Date/Time: 09/13/2023/11:13:55 AM    Final     Anti-infectives: Anti-infectives (From admission, onward)    None        Assessment/Plan Runover by construction equipment  RLE swelling and pain with hematoma- CT with hematoma and question of morel -lavallee type injury. Ortho consulted and recc ongoing observation and WBAT with therapies. Consider reimaging if inability to weight bear or swelling. Pulses dopplerable this am. Decreased sensation R upper lateral thigh - continue to monitor. Hgb stable. Continue mobilizing with PT, able to ambulate yesterday. Nonop management per ortho. Hypotension - improving s/p 1PRBC 11/25 and IV fluids. Off levo for 2 days, continue midodrine today per cardiology, likely wean in next few days. CAD s/p stent in 2012 - ASA not on DOAC CHF, unspecified - monitor for pulmonary edema, gentle fluids, ECHO 2023 EF difficult to assess but mild to moderately decreased . Echo 11/26 with mildly improved EF to 40-45%. Appreciate cardiology recommendations.  Asthma/COPD/?OSA - continue albuterol. Snores at baseline not previously dx with OSA. Wean O2. Encouraged IS   FEN: heart VTE: LMWH ID: [email or web address]    Therapies.  Dispo: transfer to progressive care  I reviewed Consultant cardiology notes, last 24 h vitals and pain scores, last 48 h intake and output, last 24 h labs and trends, and last 24 h imaging results.   LOS: 3 days   Sophronia Simas, MD [facility] Surgery General, Hepatobiliary and Pancreatic Surgery 09/15/23 9:13 AM

## 2023-09-15 NOTE — Progress Notes (Signed)
Trauma Event Note   ITSS score completed, see flowsheet.  Dakarri Kessinger O Trygve Thal  Trauma Response RN  Please call TRN at [phone] for further assistance.

## 2023-09-15 NOTE — Progress Notes (Signed)
Rounding Note    Patient Name: Brian GRETH Sr. Date of Encounter: 09/15/2023  [facility] Cardiologist: Rollene Rotunda, MD   Subjective   Denies any SOB  Inpatient Medications    Scheduled Meds:  sodium chloride   Intravenous Once   acetaminophen  1,000 mg Oral Q6H   aspirin EC  81 mg Oral Daily   Chlorhexidine Gluconate Cloth  6 each Topical Daily   docusate sodium  100 mg Oral BID   enoxaparin (LOVENOX) injection  40 mg Subcutaneous Q12H   furosemide  40 mg Intravenous Daily   methocarbamol  750 mg Oral TID   midodrine  5 mg Oral TID WC   pantoprazole  40 mg Oral Daily   Or   pantoprazole (PROTONIX) IV  40 mg Intravenous Daily   rosuvastatin  20 mg Oral Daily   sodium chloride flush  10-40 mL Intracatheter Q12H   Continuous Infusions:  norepinephrine (LEVOPHED) Adult infusion Stopped (09/13/23 1809)   PRN Meds: albuterol, hydrALAZINE, HYDROmorphone (DILAUDID) injection, melatonin, metoprolol tartrate, nitroGLYCERIN, ondansetron **OR** ondansetron (ZOFRAN) IV, mouth rinse, oxyCODONE, oxyCODONE, polyethylene glycol, sodium chloride flush   Vital Signs    Vitals:   09/15/23 0100 09/15/23 0200 09/15/23 0300 09/15/23 0400  BP: 110/71 131/80 124/70 121/79  Pulse: 80 75 78 75  Resp: 10 10 10  (!) 8  Temp:    98.3 F (36.8 C)  TempSrc:      SpO2: 96% 96% 92% 97%  Weight:      Height:        Intake/Output Summary (Last 24 hours) at 09/15/2023 0721 Last data filed at 09/15/2023 0400 Gross per 24 hour  Intake 210 ml  Output 1078 ml  Net -868 ml      09/12/2023   11:27 AM 07/11/2023    2:55 PM 07/23/2022    8:59 AM  Last 3 Weights  Weight (lbs) 280 lb 287 lb 263 lb 12.8 oz  Weight (kg) 127.007 kg 130.182 kg 119.659 kg      Telemetry    NSR - Personally Reviewed  ECG    N/a - Personally Reviewed  Physical Exam   GEN: No acute distress.   Neck: No JVD Cardiac: RRR, no murmurs, rubs, or gallops.  Respiratory: Clear to auscultation  bilaterally. GI: Soft, nontender, non-distended  MS: No edema; No deformity. Neuro:  Nonfocal  Psych: Normal affect   Labs    High Sensitivity Troponin:  No results for input(s): "TROPONINIHS" in the last 720 hours.   Chemistry Recent Labs  Lab 09/12/23 1058 09/12/23 1112 09/13/23 0459 09/14/23 0524 09/15/23 0545  NA 134*   < > 137 135 133*  K 3.9   < > 4.1 4.2 3.9  CL 97*   < > 102 101 98  CO2 26  --  28 26 30   GLUCOSE 208*   < > 137* 119* 112*  BUN 22*   < > 22* 22* 15  CREATININE 1.74*   < > 1.62* 1.35* 1.25*  CALCIUM 8.9  --  8.6* 8.4* 8.5*  PROT 7.0  --   --   --   --   ALBUMIN 3.3*  --   --   --   --   AST 23  --   --   --   --   ALT 21  --   --   --   --   ALKPHOS 88  --   --   --   --  BILITOT 0.7  --   --   --   --   GFRNONAA 46*  --  50* >60 >60  ANIONGAP 11  --  7 8 5    < > = values in this interval not displayed.    Lipids No results for input(s): "CHOL", "TRIG", "HDL", "LABVLDL", "LDLCALC", "CHOLHDL" in the last 168 hours.  Hematology Recent Labs  Lab 09/13/23 0459 09/14/23 0524 09/15/23 0545  WBC 8.5 9.2 8.5  RBC 4.31 3.99* 3.83*  HGB 13.0 12.1* 11.6*  HCT 39.9 37.5* 36.2*  MCV 92.6 94.0 94.5  MCH 30.2 30.3 30.3  MCHC 32.6 32.3 32.0  RDW 14.0 13.5 13.3  PLT 189 143* 132*   Thyroid No results for input(s): "TSH", "FREET4" in the last 168 hours.  BNPNo results for input(s): "BNP", "PROBNP" in the last 168 hours.  DDimer No results for input(s): "DDIMER" in the last 168 hours.   Radiology    ECHOCARDIOGRAM COMPLETE  Result Date: 09/13/2023    ECHOCARDIOGRAM REPORT   Patient Name:   Brian BROCATO Sr. Date of Exam: 09/13/2023 Medical Rec #:  [id number]          Height:       68.0 in Accession #:    [id number]         Weight:       280.0 lb Date of Birth:  06/03/1966         BSA:          2.358 m Patient Age:    56 years           BP:           114/69 mmHg Patient Gender: M                  HR:           64 bpm. Exam Location:  Inpatient  Procedure: 2D Echo, Cardiac Doppler, Color Doppler and Intracardiac            Opacification Agent Indications:    CHF  History:        Patient has prior history of Echocardiogram examinations, most                 recent 12/02/2021. Cardiomyopathy, Previous Myocardial Infarction                 and CAD, Signs/Symptoms:Shortness of Breath,                 Dizziness/Lightheadedness and Hypotension; Risk Factors:Family                 History of Coronary Artery Disease.  Sonographer:    Milda Smart Referring Phys: [id number] ANGELA NICOLE DUKE  Sonographer Comments: Patient is obese. Image acquisition challenging due to patient body habitus. IMPRESSIONS  1. The LV endocardium is not seen well even with definity. There distal anteroseptal wall and apex appear akinetic. Marland Kitchen Left ventricular ejection fraction, by estimation, is 40 to 45%. The left ventricle has mildly decreased function. The left ventricle demonstrates regional wall motion abnormalities (see scoring diagram/findings for description). Left ventricular diastolic parameters were normal.  2. Right ventricular systolic function is normal. The right ventricular size is normal.  3. The mitral valve is normal in structure. No evidence of mitral valve regurgitation. No evidence of mitral stenosis.  4. The aortic valve is normal in structure. Aortic valve regurgitation is not visualized. No aortic stenosis is present.  5. The inferior vena  cava is normal in size with greater than 50% respiratory variability, suggesting right atrial pressure of 3 mmHg. FINDINGS  Left Ventricle: The LV endocardium is not seen well even with definity. There distal anteroseptal wall and apex appear akinetic. Left ventricular ejection fraction, by estimation, is 40 to 45%. The left ventricle has mildly decreased function. The left ventricle demonstrates regional wall motion abnormalities. Definity contrast agent was given IV to delineate the left ventricular endocardial borders. The left  ventricular internal cavity size was normal in size. There is no left ventricular hypertrophy.  Left ventricular diastolic parameters were normal.  LV Wall Scoring: The apical septal segment is akinetic. The apex is hypokinetic. Right Ventricle: The right ventricular size is normal. No increase in right ventricular wall thickness. Right ventricular systolic function is normal. Left Atrium: Left atrial size was normal in size. Right Atrium: Right atrial size was normal in size. Pericardium: There is no evidence of pericardial effusion. Mitral Valve: The mitral valve is normal in structure. No evidence of mitral valve regurgitation. No evidence of mitral valve stenosis. Tricuspid Valve: The tricuspid valve is normal in structure. Tricuspid valve regurgitation is trivial. No evidence of tricuspid stenosis. Aortic Valve: The aortic valve is normal in structure. Aortic valve regurgitation is not visualized. No aortic stenosis is present. Pulmonic Valve: The pulmonic valve was normal in structure. Pulmonic valve regurgitation is not visualized. No evidence of pulmonic stenosis. Aorta: The aortic root is normal in size and structure. Venous: The inferior vena cava is normal in size with greater than 50% respiratory variability, suggesting right atrial pressure of 3 mmHg. IAS/Shunts: No atrial level shunt detected by color flow Doppler.  LEFT VENTRICLE PLAX 2D LVIDd:         4.60 cm   Diastology LVIDs:         3.30 cm   LV e' medial:    5.98 cm/s LV PW:         0.90 cm   LV E/e' medial:  15.2 LV IVS:        0.80 cm   LV e' lateral:   8.05 cm/s LVOT diam:     2.20 cm   LV E/e' lateral: 11.3 LV SV:         82 LV SV Index:   35 LVOT Area:     3.80 cm  RIGHT VENTRICLE             IVC RV S prime:     12.60 cm/s  IVC diam: 1.50 cm TAPSE (M-mode): 1.9 cm LEFT ATRIUM           Index        RIGHT ATRIUM           Index LA diam:      3.50 cm 1.48 cm/m   RA Area:     15.10 cm LA Vol (A4C): 44.2 ml 18.75 ml/m  RA Volume:   37.50 ml   15.90 ml/m  AORTIC VALVE LVOT Vmax:   99.00 cm/s LVOT Vmean:  69.600 cm/s LVOT VTI:    0.217 m  AORTA Ao Root diam: 3.00 cm Ao Asc diam:  3.10 cm MITRAL VALVE MV Area (PHT): 2.99 cm    SHUNTS MV Decel Time: 254 msec    Systemic VTI:  0.22 m MV E velocity: 90.70 cm/s  Systemic Diam: 2.20 cm MV A velocity: 63.40 cm/s MV E/A ratio:  1.43 Arvilla Meres MD Electronically signed by Arvilla Meres MD Signature Date/Time: 09/13/2023/11:13:55 AM  Final     Cardiac Studies     Patient Profile  57 y.o. male with a hx of chronic systolic heart failure/ICM, CAD with prior PCI in 2012 and CTO of RCA, HTN, HLD, obesity, and chronic granulomatosis who is being seen for the evaluation of hypotension   Assessment & Plan    1.Hypotension - presented with traumatic right leg injury, run over by construction equipment - initial lactic acid 3.3. Mild downtrend in Hgb, did receive 1 unit of blood - 08/2023 echo: LVEF 40-45% with WMAs, normal diastolic, normal RV. Cardiac function would not support cardiogenic etiology for hypotension. Has not had evidence of septic shock - - previously on levophed, weaned off. Now on midodrine 5mg  tid, MAPs 80s to 90s  - not clear etiology of hypotension. Perhaps mixed etiologies with blood loss combined with vasodilatation from pain medications. Cr trend and uniform decrease in cell lines with IVFs suggest maybe he was also dry on presentation. Other possibility would be a vagal vasodepressor component given severe pain from injury. There is no evidence of cardiogenic or septic shock.  - continue midodrine today, likely will be able to wean over next day or so.     2. Chronic HFrEF 11/2021 echo:  - 08/2023 echo: LVEF 40-45% with WMAs, normal diastolic, normal RV. Cardiac function would not support cardiogenic etiology for hypotension - no issues with volume overload this admission - HF meds on hold due to issues with hypotension this admission. Continue to hold today.    - appears fairly euvolemic on exam though limited by body habitus. Add on BNP for tomorrow AM labs.    3. CAD -  hx of PCI-LAD 2012, CTO-RCA - nuclear stress test in 2018 with scar - no acute issues this admissoin  For questions or updates, please contact [facility] Please consult [email or web address] for contact info under        Signed, Dina Rich, MD  09/15/2023, 7:21 AM

## 2023-09-16 DIAGNOSIS — I5022 Chronic systolic (congestive) heart failure: Secondary | ICD-10-CM | POA: Diagnosis not present

## 2023-09-16 DIAGNOSIS — S8011XA Contusion of right lower leg, initial encounter: Secondary | ICD-10-CM | POA: Diagnosis not present

## 2023-09-16 DIAGNOSIS — I959 Hypotension, unspecified: Secondary | ICD-10-CM | POA: Diagnosis not present

## 2023-09-16 LAB — BASIC METABOLIC PANEL
Anion gap: 8 (ref 5–15)
BUN: 15 mg/dL (ref 6–20)
CO2: 28 mmol/L (ref 22–32)
Calcium: 8.5 mg/dL — ABNORMAL LOW (ref 8.9–10.3)
Chloride: 100 mmol/L (ref 98–111)
Creatinine, Ser: 1.23 mg/dL (ref 0.61–1.24)
GFR, Estimated: >60 mL/min (ref >60)
Glucose, Bld: 137 mg/dL — ABNORMAL HIGH (ref 70–99)
Potassium: 3.9 mmol/L (ref 3.5–5.1)
Sodium: 136 mmol/L (ref 135–145)

## 2023-09-16 LAB — CBC
HCT: 36.7 % — ABNORMAL LOW (ref 39.0–52.0)
Hemoglobin: 12 g/dL — ABNORMAL LOW (ref 13.0–17.0)
MCH: 30.6 pg (ref 26.0–34.0)
MCHC: 32.7 g/dL (ref 30.0–36.0)
MCV: 93.6 fL (ref 80.0–100.0)
Platelets: 162 10*3/uL (ref 150–400)
RBC: 3.92 MIL/uL — ABNORMAL LOW (ref 4.22–5.81)
RDW: 13.3 % (ref 11.5–15.5)
WBC: 10.2 10*3/uL (ref 4.0–10.5)
nRBC: 0 % (ref 0.0–0.2)

## 2023-09-16 LAB — TSH: TSH: 4.061 u[IU]/mL (ref 0.350–4.500)

## 2023-09-16 LAB — BRAIN NATRIURETIC PEPTIDE: B Natriuretic Peptide: 71.8 pg/mL (ref 0.0–100.0)

## 2023-09-16 MED ORDER — OXYCODONE HCL 5 MG PO TABS: 5.0000 mg | ORAL_TABLET | ORAL | 0 refills | Status: DC | PRN

## 2023-09-16 MED ORDER — MIDODRINE HCL 5 MG PO TABS: 5.0000 mg | ORAL_TABLET | Freq: Three times a day (TID) | ORAL | 0 refills | Status: DC

## 2023-09-16 NOTE — Plan of Care (Signed)

## 2023-09-16 NOTE — Progress Notes (Addendum)
Progress Note  Patient Name: Brian TYRONE Sr. Date of Encounter: 09/16/2023  Primary Cardiologist: Rollene Rotunda, MD  Subjective   No CP or SOB States trauma may be planning dc later today or tomorrow Reports prior to admission he would periodically have edema at the end of the day, on Lasix 20mg  PTA  Inpatient Medications    Scheduled Meds:  acetaminophen  1,000 mg Oral Q6H   aspirin EC  81 mg Oral Daily   Chlorhexidine Gluconate Cloth  6 each Topical Daily   docusate sodium  100 mg Oral BID   enoxaparin (LOVENOX) injection  40 mg Subcutaneous Q12H   midodrine  5 mg Oral TID WC   pantoprazole  40 mg Oral Daily   Or   pantoprazole (PROTONIX) IV  40 mg Intravenous Daily   rosuvastatin  20 mg Oral Daily   sodium chloride flush  10-40 mL Intracatheter Q12H   Continuous Infusions:  PRN Meds: albuterol, hydrALAZINE, HYDROmorphone (DILAUDID) injection, melatonin, metoprolol tartrate, nitroGLYCERIN, ondansetron **OR** ondansetron (ZOFRAN) IV, mouth rinse, oxyCODONE, oxyCODONE, polyethylene glycol, sodium chloride flush   Vital Signs    Vitals:   09/16/23 0700 09/16/23 0800 09/16/23 0814 09/16/23 0900  BP:   119/86   Pulse: 87 96 86 (!) 101  Resp: 15 14 19 19   Temp:   (!) 97.2 F (36.2 C)   TempSrc:   Axillary   SpO2: 94% 95% 94% 93%  Weight:      Height:        Intake/Output Summary (Last 24 hours) at 09/16/2023 1149 Last data filed at 09/16/2023 0943 Gross per 24 hour  Intake 20 ml  Output --  Net 20 ml      09/12/2023   11:27 AM 07/11/2023    2:55 PM 07/23/2022    8:59 AM  Last 3 Weights  Weight (lbs) 280 lb 287 lb 263 lb 12.8 oz  Weight (kg) 127.007 kg 130.182 kg 119.659 kg     Telemetry    NSR occ PVCs - Personally Reviewed  Physical Exam   GEN: No acute distress.  HEENT: Normocephalic, atraumatic, sclera non-icteric. Neck: No JVD or bruits. Cardiac: RRR no murmurs, rubs, or gallops.  Respiratory: Clear to auscultation bilaterally. Breathing  is unlabored. GI: Soft, nontender, non-distended, BS +x 4. MS: no deformity. Extremities: No clubbing or cyanosis. RLE is tensely edematous but without marked enlargement compared to contralateral leg. LLE no edema. Distal pedal pulses are 2+ and equal bilaterally. Neuro:  AAOx3. Follows commands. Psych:  Responds to questions appropriately with a normal affect.  Labs    High Sensitivity Troponin:  No results for input(s): "TROPONINIHS" in the last 720 hours.    Cardiac EnzymesNo results for input(s): "TROPONINI" in the last 168 hours. No results for input(s): "TROPIPOC" in the last 168 hours.   Chemistry Recent Labs  Lab 09/12/23 1058 09/12/23 1112 09/14/23 0524 09/15/23 0545 09/16/23 0600  NA 134*   < > 135 133* 136  K 3.9   < > 4.2 3.9 3.9  CL 97*   < > 101 98 100  CO2 26   < > 26 30 28   GLUCOSE 208*   < > 119* 112* 137*  BUN 22*   < > 22* 15 15  CREATININE 1.74*   < > 1.35* 1.25* 1.23  CALCIUM 8.9   < > 8.4* 8.5* 8.5*  PROT 7.0  --   --   --   --   ALBUMIN 3.3*  --   --   --   --  AST 23  --   --   --   --   ALT 21  --   --   --   --   ALKPHOS 88  --   --   --   --   BILITOT 0.7  --   --   --   --   GFRNONAA 46*   < > >60 >60 >60  ANIONGAP 11   < > 8 5 8    < > = values in this interval not displayed.     Hematology Recent Labs  Lab 09/14/23 0524 09/15/23 0545 09/16/23 0948  WBC 9.2 8.5 10.2  RBC 3.99* 3.83* 3.92*  HGB 12.1* 11.6* 12.0*  HCT 37.5* 36.2* 36.7*  MCV 94.0 94.5 93.6  MCH 30.3 30.3 30.6  MCHC 32.3 32.0 32.7  RDW 13.5 13.3 13.3  PLT 143* 132* 162    BNP Recent Labs  Lab 09/16/23 0600  BNP 71.8     DDimer No results for input(s): "DDIMER" in the last 168 hours.   Radiology    No results found.  Cardiac Studies   2d echo 09/13/23   1. The LV endocardium is not seen well even with definity. There distal  anteroseptal wall and apex appear akinetic. Marland Kitchen Left ventricular ejection  fraction, by estimation, is 40 to 45%. The left  ventricle has mildly  decreased function. The left ventricle  demonstrates regional wall motion abnormalities (see scoring  diagram/findings for description). Left ventricular diastolic parameters  were normal.   2. Right ventricular systolic function is normal. The right ventricular  size is normal.   3. The mitral valve is normal in structure. No evidence of mitral valve  regurgitation. No evidence of mitral stenosis.   4. The aortic valve is normal in structure. Aortic valve regurgitation is  not visualized. No aortic stenosis is present.   5. The inferior vena cava is normal in size with greater than 50%  respiratory variability, suggesting right atrial pressure of 3 mmHg.   Patient Profile     57 y.o. male with CAD with anterior STEMI 2012 treated with aspiration/DES to LAD with residual CTO of RCA managed medically, chronic HFrEF/ICM, episodic lapses in follow-up/medication gaps, HTN, HLD, obesity, and chronic granulomatosis admitted with trauma - working at [employer] site when he was struck and run over by the bobcat with injury to his RLE and right hip. CT with hematoma.  Labs also showed AKI Cr 1.74. Ortho consulted and recc ongoing observation and WBAT with therapies. Cardiology consulted for issues with hypotension.  Assessment & Plan    1. Hypotension in the setting of traumatic right leg injury, run over by construction equipment - 08/2023 echo: LVEF 40-45% with WMAs, normal diastolic, normal RV. Cardiac function would not support cardiogenic etiology for hypotension. Has not had evidence of septic shock. Previously documented EF 35% by nuc 2018, echo done in 2023 was challenging due to patient tolerance for procedure and refusal of Definity at that time - Levophed weaned off earlier this admission, transitioned to midodrine 5mg  TID on 11/27 - not clear etiology of hypotension. Perhaps mixed etiologies with blood loss combined with vasodilatation from pain medications. Cr trend  and uniform decrease in cell lines with IVFs suggest maybe he was also dry on presentation. Other possibility would be a vagal vasodepressor component given severe pain from injury. There was no evidence of cardiogenic or septic shock. Add on baseline TSH. - will await final med recs from Dr. Servando Salina  re: midodrine - have arranged close f/u with Dr. Antoine Poche in 1 week as well (09/23/23)  2. Chronic HFrEF - LVEF known to be chronically decreased, here 40-45% - appeared dry on admission potentially - volume status appears stable - patient required Lasix for fluid management as OP (20mg  daily), was also on Toprol 100mg  and losartan 100mg  daily - will review DC med recs with MD since he is off antihypertensives presently  3. CAD s/p prior MI/PCI 2012 - nuclear stress test in 2018 with scar - no acute issues this admission - ASA resumed 09/13/23 - statin resumed, CK was WNL  4. AKI possibly superimposed on CKD stage 2 - prior baseline around 1.2, admitted at 1.74 -> improved back to baseline (off Lasix/losartan presently)   For questions or updates, please contact [facility] Please consult [email or web address] for contact info under Cardiology/STEMI.  Signed, Laurann Montana, PA-C 09/16/2023, 11:49 AM    Patient seen and examined, note reviewed with the signed Advanced Practice Provider. I personally reviewed laboratory data, imaging studies and relevant notes. I independently examined the patient and formulated the important aspects of the plan. I have personally discussed the plan with the patient and/or family. Comments or changes to the note/plan are indicated below.  His blood pressure is marginal and this is on the current dose of midodrine. Hold other blood pressure lowering medications. Keep Lasix on for Sunday and Thursday until he sees Dr. Wallowa Lions.  Cad - continue aspirin and crestor  Thomasene Ripple DO, MS [facility] Attending Cardiologist [facility]  [address]  [address] [place], [place] [address] [phone] Website: [email or web address]

## 2023-09-16 NOTE — Progress Notes (Cosign Needed Addendum)
   Progress Note     Subjective: Ambulated in hall with RN this am. Will need to work on stairs with PT. Pain controlled.  Objective: Vital signs in last 24 hours: Temp:  [97.2 F (36.2 C)-99 F (37.2 C)] 97.2 F (36.2 C) (11/29 0814) Pulse Rate:  [74-99] 86 (11/29 0814) Resp:  [10-19] 19 (11/29 0814) BP: (104-133)/(78-86) 119/86 (11/29 0814) SpO2:  [78 %-98 %] 94 % (11/29 0814) Last BM Date :  (PTA)  Intake/Output from previous day: No intake/output data recorded. Intake/Output this shift: No intake/output data recorded.  Physical Exam:  General: NAD HEENT: head is normocephalic, atraumatic Heart: RRR Lungs: NWOB on room air Abd: soft, NT MS: RLE with edema and ecchymosis of right posteromedial thigh through right ankle/foot, abrasions to RLE, R foot NVI. Insensate to light touch R upper lateral thigh, sensation intact to pressure.  Skin: scattered abrasions, no rash Psych: A&Ox3 with an appropriate affect.   Lab Results:  I personally reviewed all labs for past 24h   Studies/Results: No results found.  Anti-infectives: Anti-infectives (From admission, onward)    None        Assessment/Plan Runover by construction equipment  RLE swelling and pain with hematoma- CT with hematoma and question of morel -lavallee type injury. Ortho consulted and recc ongoing observation and WBAT with therapies. Consider reimaging if inability to weight bear or swelling. Pulses dopplerable this am. Decreased sensation R upper lateral thigh - continue to monitor. Hgb stable today at 12.0. Continue mobilizing with PT. Nonop management per ortho. Hypotension - improving s/p 1PRBC 11/25 and IV fluids. Off levo, continue midodrine today per cardiology, likely wean in next few days. CAD s/p stent in 2012 - ASA. not on DOAC CHF, unspecified - monitor for pulmonary edema, gentle fluids, ECHO 2023 EF difficult to assess but mild to moderately decreased . Echo 11/26 with mildly improved EF  to 40-45%. Appreciate cardiology recommendations.  Asthma/COPD/?OSA - continue albuterol. Snores at baseline not previously dx with OSA. Wean O2. Encouraged IS   FEN: heart VTE: LMWH ID: [email or web address]    Therapies.  Dispo: PT reccs HHPT. Stable for discharge from traumatic injury standpoint. Will reach out to cardiology regarding cardiac issues/hypotension  Addendum - after working with PT this am HHPT no longer recommended  I reviewed Consultant cardiology notes, last 24 h vitals and pain scores, last 48 h intake and output, last 24 h labs and trends, and last 24 h imaging results.   LOS: 4 days   Eric Form, [facility] Surgery 09/16/2023, 8:48 AM Please see Amion for pager number during day hours 7:00am-4:30pm

## 2023-09-16 NOTE — TOC Transition Note (Signed)
Transition of Care [facility]) - CM/SW Discharge Note   Patient Details  Name: PAXTYN EVARD Sr. MRN: [id number] Date of Birth: [birth year]  [facility] CM/SW Contact:  Glennon Mac, RN Phone Number: 09/16/2023, 2:51 PM   Clinical Narrative:    Patient for possible dc today or tomorrow.  PT now recommending no OP follow up, RW and BSC for home.  Met with patient and wife at bedside; they have not heard anything from HR regarding WC Case Mgr or Adjustor contact information.  Patient states his friend has a RW and BSC that he can borrow to use at dc.  He plans to follow up with his employer on Monday regarding WC case.    Final next level of care: Home/Self Care Barriers to Discharge: Barriers Resolved                         Discharge Plan and Services Additional resources added to the After Visit Summary for     Discharge Planning Services: CM Consult                                 Social Determinants of Health (SDOH) Interventions SDOH Screenings   Food Insecurity: No Food Insecurity (09/12/2023)  Housing: Low Risk  (09/12/2023)  Transportation Needs: No Transportation Needs (09/12/2023)  Utilities: Not At Risk (09/12/2023)  Depression (PHQ2-9): Low Risk  (01/07/2020)  Tobacco Use: Medium Risk (09/12/2023)     Readmission Risk Interventions     No data to display         Quintella Baton, RN, BSN  Trauma/Neuro ICU Case Manager [phone]

## 2023-09-16 NOTE — Plan of Care (Signed)
  Problem: Education: Goal: Knowledge of General Education information will improve Description: Including pain rating scale, medication(s)/side effects and non-pharmacologic comfort measures 09/16/2023 2042 by Eber Hong, RN Outcome: Progressing 09/16/2023 2040 by Eber Hong, RN Outcome: Progressing   Problem: Health Behavior/Discharge Planning: Goal: Ability to manage health-related needs will improve 09/16/2023 2042 by Eber Hong, RN Outcome: Progressing 09/16/2023 2040 by Eber Hong, RN Outcome: Progressing   Problem: Clinical Measurements: Goal: Ability to maintain clinical measurements within normal limits will improve 09/16/2023 2042 by Eber Hong, RN Outcome: Progressing 09/16/2023 2040 by Eber Hong, RN Outcome: Progressing Goal: Will remain free from infection 09/16/2023 2042 by Eber Hong, RN Outcome: Progressing 09/16/2023 2040 by Eber Hong, RN Outcome: Progressing Goal: Diagnostic test results will improve 09/16/2023 2042 by Eber Hong, RN Outcome: Progressing 09/16/2023 2040 by Eber Hong, RN Outcome: Progressing Goal: Respiratory complications will improve 09/16/2023 2042 by Eber Hong, RN Outcome: Progressing 09/16/2023 2040 by Eber Hong, RN Outcome: Progressing Goal: Cardiovascular complication will be avoided 09/16/2023 2042 by Eber Hong, RN Outcome: Progressing 09/16/2023 2040 by Eber Hong, RN Outcome: Progressing   Problem: Activity: Goal: Risk for activity intolerance will decrease 09/16/2023 2042 by Eber Hong, RN Outcome: Progressing 09/16/2023 2040 by Eber Hong, RN Outcome: Progressing   Problem: Nutrition: Goal: Adequate nutrition will be maintained 09/16/2023 2042 by Eber Hong, RN Outcome: Progressing 09/16/2023 2040 by Eber Hong, RN Outcome: Progressing   Problem: Coping: Goal: Level of anxiety will decrease 09/16/2023  2042 by Eber Hong, RN Outcome: Progressing 09/16/2023 2040 by Eber Hong, RN Outcome: Progressing   Problem: Elimination: Goal: Will not experience complications related to bowel motility 09/16/2023 2042 by Eber Hong, RN Outcome: Progressing 09/16/2023 2040 by Eber Hong, RN Outcome: Progressing Goal: Will not experience complications related to urinary retention 09/16/2023 2042 by Eber Hong, RN Outcome: Progressing 09/16/2023 2040 by Eber Hong, RN Outcome: Progressing   Problem: Pain Management: Goal: General experience of comfort will improve 09/16/2023 2042 by Eber Hong, RN Outcome: Progressing 09/16/2023 2040 by Eber Hong, RN Outcome: Progressing   Problem: Safety: Goal: Ability to remain free from injury will improve 09/16/2023 2042 by Eber Hong, RN Outcome: Progressing 09/16/2023 2040 by Eber Hong, RN Outcome: Progressing   Problem: Skin Integrity: Goal: Risk for impaired skin integrity will decrease 09/16/2023 2042 by Eber Hong, RN Outcome: Progressing 09/16/2023 2040 by Eber Hong, RN Outcome: Progressing

## 2023-09-16 NOTE — Progress Notes (Signed)
Physical Therapy Treatment Patient Details Name: Brian VARANO Sr. MRN: [id number] DOB: 1966/10/18 Today's Date: 09/16/2023   History of Present Illness 57 y.o. male s/p RLE injury after being run over by [employer] sales representative. Ortho recommended WBAT.  PMH positive for CAD with STEMI s/p PCI with DES to LAD EF 30% (2012), CHF, granulomatosis, gout, asthma.    PT Comments  Patient progressing well, had already walked with nursing so used w/c with side entry to simulate car transfer and practiced stairs for home entry with crutch and rail in stairwell.  Wife present and able to assist.  Patient feels more comfortable and declines HHPT for now.  Encouraged to follow up with MD and to see if needing outpatient PT follow up as his leg heals.      If plan is discharge home, recommend the following: A little help with walking and/or transfers;Assistance with cooking/housework;Assist for transportation;Help with stairs or ramp for entrance;A little help with bathing/dressing/bathroom   Can travel by private vehicle        Equipment Recommendations  Rolling walker (2 wheels);BSC/3in1;Crutches    Recommendations for Other Services       Precautions / Restrictions Precautions Precautions: Fall Restrictions Weight Bearing Restrictions: No RLE Weight Bearing: Weight bearing as tolerated     Mobility  Bed Mobility               General bed mobility comments: in recliner    Transfers Overall transfer level: Needs assistance Equipment used: Rolling walker (2 wheels) Transfers: Sit to/from Stand Sit to Stand: Supervision           General transfer comment: cues for hand placement, also simulated car transfer into and out of wheelchair with side entry with cues and CGA to S.    Ambulation/Gait Ambulation/Gait assistance: Supervision Gait Distance (Feet): 100 Feet Assistive device: Rolling walker (2 wheels) Gait Pattern/deviations: Step-through pattern, Decreased stride  length, Antalgic       General Gait Details: had already walked with nursing this am. walked some then used w/c to access stairs.   Stairs Stairs: Yes Stairs assistance: Contact guard assist Stair Management: One rail Left, Step to pattern, With crutches, Forwards Number of Stairs: 3 General stair comments: demonstration and cues for technique, wife present and able to verbalize understanding with details for assisting pt.   Wheelchair Mobility     Tilt Bed    Modified Rankin (Stroke Patients Only)       Balance Overall balance assessment: Needs assistance Sitting-balance support: No upper extremity supported Sitting balance-Leahy Scale: Good     Standing balance support: No upper extremity supported Standing balance-Leahy Scale: Fair Standing balance comment: standing without UE support for walker adjustment                            Cognition Arousal: Alert Behavior During Therapy: WFL for tasks assessed/performed Overall Cognitive Status: Within Functional Limits for tasks assessed                                          Exercises      General Comments General comments (skin integrity, edema, etc.): VSS on RA for ambulation.  Discussed follow up and feels comfortable for home without HHPT      Pertinent Vitals/Pain Pain Assessment Pain Assessment: Faces Faces Pain Scale: Hurts a little  bit Pain Location: R LE with ambulation Pain Descriptors / Indicators: Grimacing, Discomfort, Aching Pain Intervention(s): Monitored during session    Home Living                          Prior Function            PT Goals (current goals can now be found in the care plan section) Progress towards PT goals: Progressing toward goals    Frequency    Min 1X/week      PT Plan      Co-evaluation              AM-PAC PT "6 Clicks" Mobility   Outcome Measure  Help needed turning from your back to your side while  in a flat bed without using bedrails?: None Help needed moving from lying on your back to sitting on the side of a flat bed without using bedrails?: None Help needed moving to and from a bed to a chair (including a wheelchair)?: A Little Help needed standing up from a chair using your arms (e.g., wheelchair or bedside chair)?: A Little Help needed to walk in hospital room?: A Little Help needed climbing 3-5 steps with a railing? : A Little 6 Click Score: 20    End of Session Equipment Utilized During Treatment: Gait belt Activity Tolerance: Patient tolerated treatment well Patient left: in chair;with call bell/phone within reach;with family/visitor present   PT Visit Diagnosis: Difficulty in walking, not elsewhere classified (R26.2);Pain Pain - Right/Left: Right Pain - part of body: Leg     Time: 1003-1030 PT Time Calculation (min) (ACUTE ONLY): 27 min  Charges:    $Gait Training: 8-22 mins $Therapeutic Activity: 8-22 mins PT General Charges $$ ACUTE PT VISIT: 1 Visit                     Sheran Lawless, PT Acute Rehabilitation Services Office:[phone] 09/16/2023    [name] 09/16/2023, 10:42 AM

## 2023-09-16 NOTE — Progress Notes (Signed)
Orthopedic Tech Progress Note Patient Details:  Brian KUMPF Sr. 02-07-1966 [id number]  Supplied crutches to pt, nurse had previously given instructions on how to ambulate with them Ortho Devices Type of Ortho Device: Crutches Ortho Device/Splint Interventions: Ordered, Application, Adjustment   Post Interventions Patient Tolerated: Well Instructions Provided: Adjustment of device  Brian Gross 09/16/2023, 11:44 AM

## 2023-09-17 LAB — BASIC METABOLIC PANEL
Anion gap: 5 (ref 5–15)
BUN: 15 mg/dL (ref 6–20)
CO2: 30 mmol/L (ref 22–32)
Calcium: 8.7 mg/dL — ABNORMAL LOW (ref 8.9–10.3)
Chloride: 102 mmol/L (ref 98–111)
Creatinine, Ser: 0.98 mg/dL (ref 0.61–1.24)
GFR, Estimated: >60 mL/min (ref >60)
Glucose, Bld: 108 mg/dL — ABNORMAL HIGH (ref 70–99)
Potassium: 4 mmol/L (ref 3.5–5.1)
Sodium: 137 mmol/L (ref 135–145)

## 2023-09-17 LAB — CBC
HCT: 36.5 % — ABNORMAL LOW (ref 39.0–52.0)
Hemoglobin: 11.6 g/dL — ABNORMAL LOW (ref 13.0–17.0)
MCH: 30.1 pg (ref 26.0–34.0)
MCHC: 31.8 g/dL (ref 30.0–36.0)
MCV: 94.6 fL (ref 80.0–100.0)
Platelets: 148 10*3/uL — ABNORMAL LOW (ref 150–400)
RBC: 3.86 MIL/uL — ABNORMAL LOW (ref 4.22–5.81)
RDW: 13.3 % (ref 11.5–15.5)
WBC: 10.7 10*3/uL — ABNORMAL HIGH (ref 4.0–10.5)
nRBC: 0 % (ref 0.0–0.2)

## 2023-09-17 MED ORDER — METHOCARBAMOL 500 MG PO TABS: 500.0000 mg | ORAL_TABLET | Freq: Four times a day (QID) | ORAL | 0 refills | Status: DC | PRN

## 2023-09-17 MED ORDER — FUROSEMIDE 20 MG PO TABS: ORAL_TABLET | ORAL | Status: DC
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
## 2023-09-19 ENCOUNTER — Telehealth: Payer: Self-pay | Admitting: *Deleted

## 2023-09-19 NOTE — Transitions of Care (Post Inpatient/ED Visit) (Signed)
09/19/2023  Name: Brian RAPPA Sr. MRN: [id number] DOB: [birth year]  Today's TOC FU Call Status: Today's TOC FU Call Status:: Successful TOC FU Call Completed TOC FU Call Complete Date: 09/19/23 Patient's Name and Date of Birth confirmed.  Transition Care Management Follow-up Telephone Call Date of Discharge: 09/17/23 Discharge Facility: [facility]) Type of Discharge: Inpatient Admission Primary Inpatient Discharge Diagnosis:: Hematoma of lower extremity, right, initial encounter How have you been since you were released from the hospital?: Better (Doing as good as expected) Any questions or concerns?: Yes Patient Questions/Concerns:: Rn was going over medications. Patient was not aware that the methocarbamol was muscle spasms. He was trying to not take the oxycodone if possible unless really needed it. Patient Questions/Concerns Addressed: Other:  Items Reviewed: Did you receive and understand the discharge instructions provided?: Yes Medications obtained,verified, and reconciled?: Yes (Medications Reviewed) Any new allergies since your discharge?: No Dietary orders reviewed?: No Do you have support at home?: Yes People in Home: spouse Name of Support/Comfort Primary Source: Brian Gross  Medications Reviewed Today: Medications Reviewed Today     Reviewed by Brian Cook, RN (Case Manager) on 09/19/23 at 1233  Med List Status: <None>   Medication Order Taking? Sig Documenting Provider Last Dose Status Informant  acetaminophen (TYLENOL) 500 MG tablet [id number] Yes Take 500 mg by mouth every 6 (six) hours as needed for moderate pain. [provider] Taking Active Self  albuterol (VENTOLIN HFA) 108 (90 Base) MCG/ACT inhaler [id number] Yes Inhale 1-2 puffs into the lungs every 6 (six) hours as needed for wheezing or shortness of breath. Rollene Rotunda, MD Taking Active Self           Med Note Valrie Hart   Thu Feb 19, 2021 10:32 AM)    aspirin EC 81 MG tablet  [id number] Yes Take 81 mg by mouth daily. Swallow whole. [provider] Taking Active   furosemide (LASIX) 20 MG tablet [id number] Yes Take one tablet every Sunday and Thursday Meuth, Lina Sar, PA-C Taking Active   methocarbamol (ROBAXIN) 500 MG tablet [id number] Yes Take 1 tablet (500 mg total) by mouth every 6 (six) hours as needed for muscle spasms. Meuth, Brooke A, PA-C Taking Active   midodrine (PROAMATINE) 5 MG tablet [id number] Yes Take 1 tablet (5 mg total) by mouth 3 (three) times daily with meals. Kinsinger, De Blanch, MD Taking Active   nitroGLYCERIN (NITROSTAT) 0.4 MG SL tablet [id number] Yes Place 1 tablet (0.4 mg total) under the tongue every 5 (five) minutes as needed for chest pain. Ellsworth Lennox, PA-C Taking Active Self           Med Note Ernest Mallick, Larose Kells   Thu Feb 19, 2021 10:32 AM)    oxyCODONE (OXY IR/ROXICODONE) 5 MG immediate release tablet [id number] Yes Take 1 tablet (5 mg total) by mouth every 4 (four) hours as needed for moderate pain (pain score 4-6). Kinsinger, De Blanch, MD Taking Active   rosuvastatin (CRESTOR) 20 MG tablet [id number] Yes TAKE 1 TABLET BY MOUTH EVERY DAY Rollene Rotunda, MD Taking Active             Home Care and Equipment/Supplies: Were Home Health Services Ordered?: NA Any new equipment or medical supplies ordered?: NA (Patient got equipmenrt from friend that already had it)  Functional Questionnaire: Do you need assistance with bathing/showering or dressing?: Yes Do you need assistance with meal preparation?: Yes Do you need assistance with eating?: No Do you have difficulty maintaining  continence: No Do you need assistance with getting out of bed/getting out of a chair/moving?: Yes Do you have difficulty managing or taking your medications?: No  Follow up appointments reviewed: PCP Follow-up appointment confirmed?: No MD Provider Line [phone] Given: Yes (Patient wife stated she would call for appt. Refused RN to  call) Specialist Hospital Follow-up appointment confirmed?: Yes Date of Specialist follow-up appointment?: 09/23/23 Follow-Up Specialty Provider:: Dr Antoine Poche Do you need transportation to your follow-up appointment?: No Do you understand care options if your condition(s) worsen?: Yes-patient verbalized understanding  TOC interventions discussed/reviewed: -Discussed/reviewed insurance/health plans benefits -Doctor visit discussed/reviewed -PCP -Doctor visits discussed/reviewed-Specialist -Provided Verbal Education: 30-day TOC program, nutrition, meds & their functions, symptom mgmt., fall/safety measures in the home No further outreach calls needed,  Brian Gross BSN RN Population Health- Transition of Care Team.  [facility] [phone]

## 2023-09-22 DIAGNOSIS — I10 Essential (primary) hypertension: Secondary | ICD-10-CM | POA: Insufficient documentation

## 2023-09-22 NOTE — Progress Notes (Signed)
Cardiology Office Note:   Date:  09/23/2023  ID:  Brian Jeans Sr., DOB 1965/11/24, MRN [id number] PCP: Wanda Plump, MD  [facility] Providers Cardiologist:  Rollene Rotunda, MD {  History of Present Illness:   Brian Gross. is a 57 y.o. male who presents for evaluation of  hypotension.  He has CAD. He had a negative stress perfusion study 2008. Unfortunately he presented in late December of 2012 with an anterior myocardial infarction. This was a late presentation. He did have an occluded LAD which was treated with a drug-eluting stent. There was also RCA occlusion which was apparently chronic.  He did not come back for follow-up for about 2 years until he got some insurance. He stopped his medication. He then came back in  2015. I tried to get an ultrasound but he could not afford this.  I saw him in 2017 and he was not able to afford all of his medicines.  In 2018 he was on [place] Stader's PA schedule at [facility] for acute dyspnea. He had a [employer].  There was a larger area of anterior/anteroseptal and apical wall defect consistent with scar.  The EF was 35%.  At the last visit I increased his Cozaar.     He was in the ED late last month with a leg injury with hematoma.  He had hypotension and cardiology was consulted.  I reviewed these records for this visit.    This was thought to be related to blood loss and vagal.  He also likely had a reaction to pain meds.  He was treated with PRBC and hydration.  He was treated with midodrine.  His losartan and metoprolol were held.  His Lasix was reduced to twice a week.  He has not been able to weigh himself every day.  He is walking with a walker.  He has significant swelling and bruising on his right leg from the buttocks down.  He is not having any chest pressure, neck or arm discomfort.  He is not having any new shortness of breath, PND or orthopnea.  He has had no new palpitations, presyncope or syncope.  Lasix 20 Mg 2 times a  week  Of note he did have an echo during his hospitalization his EF is 40 to 45%.  There were no significant valvular abnormalities.   ROS: As stated in the HPI and negative for all other systems.  Studies Reviewed:    EKG:   EKG Interpretation Date/Time:  Friday September 23 2023 09:20:33 EST Ventricular Rate:  84 PR Interval:  162 QRS Duration:  88 QT Interval:  380 QTC Calculation: 449 R Axis:   -15  Text Interpretation: Normal sinus rhythm Low voltage QRS Poor anterior R wave progression When compared with ECG of 19-Oct-2011 08:06, Questionable change in QRS axis Nonspecific T wave abnormality now evident in Anterior leads Confirmed by Rollene Rotunda ([id number]) on 09/23/2023 10:07:05 AM    Risk Assessment/Calculations:      Physical Exam:   VS:  BP 118/72 (BP Location: Left Arm, Patient Position: Sitting, Cuff Size: Large)   Pulse 84   Ht 5\' 9"  (1.753 m)   Wt 297 lb 9.6 oz (135 kg)   SpO2 92%   BMI 43.95 kg/m    Wt Readings from Last 3 Encounters:  09/23/23 297 lb 9.6 oz (135 kg)  09/12/23 280 lb (127 kg)  07/11/23 287 lb (130.2 kg)     GEN: Well nourished, well  developed in no acute distress NECK: No JVD; No carotid bruits CARDIAC: RRR, no murmurs, rubs, gallops RESPIRATORY:  Clear to auscultation without rales, wheezing or rhonchi  ABDOMEN: Soft, non-tender, non-distended EXTREMITIES:  No edema; No deformity   ASSESSMENT AND PLAN:   CAD:   The patient has no new sypmtoms.  No further cardiovascular testing is indicated.  We will continue with aggressive risk reduction and meds as listed.   ISCHEMIC CARDIOMYOPATHY:   He are going to continue to hold the Cozaar and the metoprolol.  I will stop his midodrine.  I am also going to try to give him his Lasix 3 times a week instead of twice.  We will need to slowly titrate his meds back at this point.  DYSLIPIDEMIA:    LDL was 73.  HDL 37.  No change in therapy.   HTN:   The blood pressure was running low as above.  This  will be managed in the context of treating his heart.  Going forward.  LEG SWELLING: I called his primary provider today and arrange follow-up.  He will likely have to be seen by orthopedics.  I talked him about how to measure his leg size to keep an eye on this.  He will be seen next week by Dr. Welton Flakes.     Follow up with APP in two weeks for med titration.    Signed, Rollene Rotunda, MD

## 2023-09-23 ENCOUNTER — Telehealth: Payer: Self-pay | Admitting: Internal Medicine

## 2023-09-23 ENCOUNTER — Ambulatory Visit: Payer: Commercial Managed Care - HMO | Attending: Cardiology | Admitting: Cardiology

## 2023-09-23 ENCOUNTER — Encounter: Payer: Self-pay | Admitting: Cardiology

## 2023-09-23 VITALS — BP 118/72 | HR 84 | Ht 69.0 in | Wt 297.6 lb

## 2023-09-23 DIAGNOSIS — E785 Hyperlipidemia, unspecified: Secondary | ICD-10-CM | POA: Diagnosis not present

## 2023-09-23 DIAGNOSIS — I255 Ischemic cardiomyopathy: Secondary | ICD-10-CM | POA: Diagnosis not present

## 2023-09-23 DIAGNOSIS — I2510 Atherosclerotic heart disease of native coronary artery without angina pectoris: Secondary | ICD-10-CM

## 2023-09-23 DIAGNOSIS — I10 Essential (primary) hypertension: Secondary | ICD-10-CM

## 2023-09-23 MED ORDER — FUROSEMIDE 20 MG PO TABS: ORAL_TABLET | ORAL | Status: DC

## 2023-09-23 MED ORDER — FUROSEMIDE 20 MG PO TABS: ORAL_TABLET | ORAL | 11 refills | Status: DC

## 2023-09-23 NOTE — Telephone Encounter (Signed)
 Appt scheduled 11/15/22

## 2023-09-23 NOTE — Telephone Encounter (Signed)
Spoke with cardiology regarding this patient. Needs close follow-up. Please reach out to patient, if not done arrange a visit with me for next week. Also, needs Ortho follow-up please arrange it if necessary.

## 2023-09-23 NOTE — Telephone Encounter (Signed)
LMOM asking for Pt to return call to set up OV next week w/ PCP.

## 2023-09-23 NOTE — Patient Instructions (Signed)
Medication Instructions:  Stop taking midodrine. Increase furosemide to 3 times per week on Sunday, Tuesday and Thursday. *If you need a refill on your cardiac medications before your next appointment, please call your pharmacy*  Follow-Up: At [facility], you and your health needs are our priority.  As part of our continuing mission to provide you with exceptional heart care, we have created designated Provider Care Teams.  These Care Teams include your primary Cardiologist (physician) and Advanced Practice Providers (APPs -  Physician Assistants and Nurse Practitioners) who all work together to provide you with the care you need, when you need it.  Your next appointment:   2 week(s)  Provider:   First available APP.

## 2023-09-26 ENCOUNTER — Ambulatory Visit: Payer: Commercial Managed Care - HMO | Admitting: Internal Medicine

## 2023-09-26 ENCOUNTER — Encounter: Payer: Self-pay | Admitting: Internal Medicine

## 2023-09-26 VITALS — BP 108/66 | HR 85 | Temp 97.6°F | Resp 18 | Ht 69.0 in

## 2023-09-26 DIAGNOSIS — S8012XD Contusion of left lower leg, subsequent encounter: Secondary | ICD-10-CM

## 2023-09-26 DIAGNOSIS — I2583 Coronary atherosclerosis due to lipid rich plaque: Secondary | ICD-10-CM | POA: Diagnosis not present

## 2023-09-26 DIAGNOSIS — S8011XA Contusion of right lower leg, initial encounter: Secondary | ICD-10-CM

## 2023-09-26 DIAGNOSIS — I2510 Atherosclerotic heart disease of native coronary artery without angina pectoris: Secondary | ICD-10-CM

## 2023-09-26 DIAGNOSIS — T148XXA Other injury of unspecified body region, initial encounter: Secondary | ICD-10-CM

## 2023-09-26 DIAGNOSIS — I10 Essential (primary) hypertension: Secondary | ICD-10-CM

## 2023-09-26 LAB — CBC WITH DIFFERENTIAL/PLATELET
Basophils Absolute: 0.1 10*3/uL (ref 0.0–0.1)
Basophils Relative: 0.9 % (ref 0.0–3.0)
Eosinophils Absolute: 0.2 10*3/uL (ref 0.0–0.7)
Eosinophils Relative: 1.8 % (ref 0.0–5.0)
HCT: 37.7 % — ABNORMAL LOW (ref 39.0–52.0)
Hemoglobin: 12.3 g/dL — ABNORMAL LOW (ref 13.0–17.0)
Lymphocytes Relative: 10.8 % — ABNORMAL LOW (ref 12.0–46.0)
Lymphs Abs: 1.1 10*3/uL (ref 0.7–4.0)
MCHC: 32.5 g/dL (ref 30.0–36.0)
MCV: 94.7 fL (ref 78.0–100.0)
Monocytes Absolute: 0.7 10*3/uL (ref 0.1–1.0)
Monocytes Relative: 7.2 % (ref 3.0–12.0)
Neutro Abs: 8 10*3/uL — ABNORMAL HIGH (ref 1.4–7.7)
Neutrophils Relative %: 79.3 % — ABNORMAL HIGH (ref 43.0–77.0)
Platelets: 362 10*3/uL (ref 150.0–400.0)
RBC: 3.99 Mil/uL — ABNORMAL LOW (ref 4.22–5.81)
RDW: 14.5 % (ref 11.5–15.5)
WBC: 10.1 10*3/uL (ref 4.0–10.5)

## 2023-09-26 LAB — BASIC METABOLIC PANEL
BUN: 19 mg/dL (ref 6–23)
CO2: 30 meq/L (ref 19–32)
Calcium: 8.9 mg/dL (ref 8.4–10.5)
Chloride: 99 meq/L (ref 96–112)
Creatinine, Ser: 1.08 mg/dL (ref 0.40–1.50)
GFR: 77.55 mL/min (ref >60.00)
Glucose, Bld: 102 mg/dL — ABNORMAL HIGH (ref 70–99)
Potassium: 4.8 meq/L (ref 3.5–5.1)
Sodium: 138 meq/L (ref 135–145)

## 2023-09-26 MED ORDER — OXYCODONE HCL 5 MG PO TABS: 5.0000 mg | ORAL_TABLET | Freq: Four times a day (QID) | ORAL | 0 refills | Status: DC | PRN

## 2023-09-26 MED ORDER — METHOCARBAMOL 500 MG PO TABS: 500.0000 mg | ORAL_TABLET | Freq: Four times a day (QID) | ORAL | 0 refills | Status: DC | PRN
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
## 2023-09-26 NOTE — Patient Instructions (Addendum)
For pain: Tylenol  500 mg OTC 2 tabs a day every 8 hours as needed for pain If the pain is not responded well to Tylenol okay to take Oxy: From time to time Robaxin, muscle relaxant, as needed  Continue moving and walking within your house and gradually increase your physical activity When you are resting, keep the leg elevated Continue measuring your right leg, around the thigh and  around the calf.  Contact your doctors if: Fever chills Skin get more red, warm Your legs start swelling up You develop numbness on your feet. Chest pain or difficulty breathing  Continue checking your blood pressure at least daily, once it reaches the 130/85 levels, you will need your previous medication, let us know.  Referring you to orthopedics  GO TO THE LAB : Get the blood work     Next visit with me in 6 weeks Please schedule it at the front desk

## 2023-10-04 ENCOUNTER — Other Ambulatory Visit (HOSPITAL_COMMUNITY): Payer: Self-pay | Admitting: Student

## 2023-10-04 DIAGNOSIS — R2243 Localized swelling, mass and lump, lower limb, bilateral: Secondary | ICD-10-CM
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
## 2023-10-06 ENCOUNTER — Ambulatory Visit
Admission: RE | Admit: 2023-10-06 | Discharge: 2023-10-06 | Disposition: A | Discharge status: Home / Self Care | Payer: Commercial Managed Care - HMO | Source: Ambulatory Visit | Attending: Vascular Surgery | Admitting: Vascular Surgery

## 2023-10-06 ENCOUNTER — Encounter: Payer: Self-pay | Admitting: Cardiology

## 2023-10-06 ENCOUNTER — Other Ambulatory Visit: Payer: Self-pay | Admitting: Internal Medicine

## 2023-10-06 ENCOUNTER — Encounter: Payer: Self-pay | Admitting: Internal Medicine

## 2023-10-06 ENCOUNTER — Ambulatory Visit: Payer: Commercial Managed Care - HMO | Admitting: Cardiology

## 2023-10-06 VITALS — BP 110/66 | HR 88 | Ht 68.0 in | Wt 293.0 lb

## 2023-10-06 DIAGNOSIS — I10 Essential (primary) hypertension: Secondary | ICD-10-CM | POA: Insufficient documentation

## 2023-10-06 DIAGNOSIS — I2510 Atherosclerotic heart disease of native coronary artery without angina pectoris: Secondary | ICD-10-CM | POA: Insufficient documentation

## 2023-10-06 DIAGNOSIS — I255 Ischemic cardiomyopathy: Secondary | ICD-10-CM

## 2023-10-06 DIAGNOSIS — R2243 Localized swelling, mass and lump, lower limb, bilateral: Secondary | ICD-10-CM | POA: Diagnosis present

## 2023-10-06 MED ORDER — METOPROLOL SUCCINATE ER 25 MG PO TB24: 12.5000 mg | ORAL_TABLET | Freq: Every day | ORAL | 3 refills | Status: DC

## 2023-10-06 NOTE — Telephone Encounter (Signed)
Opened in error

## 2023-10-06 NOTE — Telephone Encounter (Signed)
Copied from CRM [id number]. Topic: Clinical - Medication Refill >> Oct 06, 2023 12:24 PM Elita Quick wrote: Most Recent Primary Care Visit:  Provider: Wanda Plump  Department: [facility]  Visit Type: HOSPITAL FU  Date: 09/26/2023  Medication: ***  Has the patient contacted their pharmacy?  (Agent: If no, request that the patient contact the pharmacy for the refill. If patient does not wish to contact the pharmacy document the reason why and proceed with request.) (Agent: If yes, when and what did the pharmacy advise?)  Is this the correct pharmacy for this prescription?  If no, delete pharmacy and type the correct one.  This is the patient's preferred pharmacy:  [facility] [phone] [place], [place] - [address] [place] [address] Phone: [phone] Fax: [phone]   Has the prescription been filled recently?   Is the patient out of the medication?   Has the patient been seen for an appointment in the last year OR does the patient have an upcoming appointment?   Can we respond through MyChart?   Agent: Please be advised that Rx refills may take up to 3 business days. We ask that you follow-up with your pharmacy.

## 2023-10-06 NOTE — Patient Instructions (Signed)
Medication Instructions:  Start Metoprolol Succinate Er 12.5 mg once a day at bedtime *If you need a refill on your cardiac medications before your next appointment, please call your pharmacy*  Lab Work: No labs  Testing/Procedures: No testing  Follow-Up: At [facility], you and your health needs are our priority.  As part of our continuing mission to provide you with exceptional heart care, we have created designated Provider Care Teams.  These Care Teams include your primary Cardiologist (physician) and Advanced Practice Providers (APPs -  Physician Assistants and Nurse Practitioners) who all work together to provide you with the care you need, when you need it.  We recommend signing up for the patient portal called "MyChart".  Sign up information is provided on this After Visit Summary.  MyChart is used to connect with patients for Virtual Visits (Telemedicine).  Patients are able to view lab/test results, encounter notes, upcoming appointments, etc.  Non-urgent messages can be sent to your provider as well.   To learn more about what you can do with MyChart, go to [email or web address].    Your next appointment:   4 week(s)  Provider:   Reather Littler, NP

## 2023-10-07 ENCOUNTER — Telehealth: Payer: Self-pay

## 2023-10-07 MED ORDER — OXYCODONE HCL 5 MG PO TABS: 5.0000 mg | ORAL_TABLET | Freq: Four times a day (QID) | ORAL | 0 refills | Status: DC | PRN

## 2023-10-07 MED ORDER — METHOCARBAMOL 500 MG PO TABS: 500.0000 mg | ORAL_TABLET | Freq: Four times a day (QID) | ORAL | 0 refills | Status: DC | PRN

## 2023-10-07 NOTE — Telephone Encounter (Signed)
Requesting: oxycodone 5mg   Contract: N/A UDS: N/A Last Visit: 09/26/23 Next Visit: 11/07/23 Last Refill: 09/26/23 #30 and 0RF   Please Advise
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
## 2023-10-07 NOTE — Telephone Encounter (Signed)
Spoke w/ Pt- informed Rx's sent.

## 2023-10-07 NOTE — Telephone Encounter (Signed)
PDMP okay, Rx sent 
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
## 2023-11-03 ENCOUNTER — Encounter: Payer: Self-pay | Admitting: Cardiology

## 2023-11-03 ENCOUNTER — Ambulatory Visit: Payer: Commercial Managed Care - HMO | Attending: Cardiology | Admitting: Cardiology

## 2023-11-03 VITALS — BP 110/64 | HR 78 | Ht 69.0 in | Wt 299.0 lb

## 2023-11-03 DIAGNOSIS — I255 Ischemic cardiomyopathy: Secondary | ICD-10-CM

## 2023-11-03 DIAGNOSIS — I10 Essential (primary) hypertension: Secondary | ICD-10-CM

## 2023-11-03 DIAGNOSIS — R2243 Localized swelling, mass and lump, lower limb, bilateral: Secondary | ICD-10-CM

## 2023-11-03 DIAGNOSIS — I2510 Atherosclerotic heart disease of native coronary artery without angina pectoris: Secondary | ICD-10-CM

## 2023-11-03 DIAGNOSIS — E785 Hyperlipidemia, unspecified: Secondary | ICD-10-CM

## 2023-11-03 MED ORDER — METOPROLOL SUCCINATE ER 25 MG PO TB24: 25.0000 mg | ORAL_TABLET | Freq: Every day | ORAL | 3 refills | Status: DC

## 2023-11-03 NOTE — Patient Instructions (Signed)
Medication Instructions:  Increase Metoprolol Succinate ER to 25 mg (1 tablet) once a day at night. *If you need a refill on your cardiac medications before your next appointment, please call your pharmacy*  Lab Work: Today we are going to get fasting lipid panel, CBC, and Cmet. If you have labs (blood work) drawn today and your tests are completely normal, you will receive your results only by: MyChart Message (if you have MyChart) OR A paper copy in the mail If you have any lab test that is abnormal or we need to change your treatment, we will call you to review the results.  Testing/Procedures: No testing  Follow-Up: At [facility], you and your health needs are our priority.  As part of our continuing mission to provide you with exceptional heart care, we have created designated Provider Care Teams.  These Care Teams include your primary Cardiologist (physician) and Advanced Practice Providers (APPs -  Physician Assistants and Nurse Practitioners) who all work together to provide you with the care you need, when you need it.  We recommend signing up for the patient portal called "MyChart".  Sign up information is provided on this After Visit Summary.  MyChart is used to connect with patients for Virtual Visits (Telemedicine).  Patients are able to view lab/test results, encounter notes, upcoming appointments, etc.  Non-urgent messages can be sent to your provider as well.   To learn more about what you can do with MyChart, go to [email or web address].    Your next appointment:   March 13th at 8:00 AM with Reather Littler, NP

## 2023-11-04 LAB — LIPID PANEL
Chol/HDL Ratio: 3.6 {ratio} (ref 0.0–5.0)
Cholesterol, Total: 124 mg/dL (ref 100–199)
HDL: 34 mg/dL — ABNORMAL LOW (ref >39)
LDL Chol Calc (NIH): 65 mg/dL (ref 0–99)
Triglycerides: 145 mg/dL (ref 0–149)
VLDL Cholesterol Cal: 25 mg/dL (ref 5–40)

## 2023-11-04 LAB — CBC
Hematocrit: 42.5 % (ref 37.5–51.0)
Hemoglobin: 13.3 g/dL (ref 13.0–17.7)
MCH: 28.2 pg (ref 26.6–33.0)
MCHC: 31.3 g/dL — ABNORMAL LOW (ref 31.5–35.7)
MCV: 90 fL (ref 79–97)
Platelets: 257 10*3/uL (ref 150–450)
RBC: 4.72 x10E6/uL (ref 4.14–5.80)
RDW: 12.9 % (ref 11.6–15.4)
WBC: 9.5 10*3/uL (ref 3.4–10.8)

## 2023-11-04 LAB — COMPREHENSIVE METABOLIC PANEL
ALT: 15 [IU]/L (ref 0–44)
AST: 15 [IU]/L (ref 0–40)
Albumin: 3.8 g/dL (ref 3.8–4.9)
Alkaline Phosphatase: 110 [IU]/L (ref 44–121)
BUN/Creatinine Ratio: 13 (ref 9–20)
BUN: 16 mg/dL (ref 6–24)
Bilirubin Total: <0.2 mg/dL (ref 0.0–1.2)
CO2: 26 mmol/L (ref 20–29)
Calcium: 9.7 mg/dL (ref 8.7–10.2)
Chloride: 99 mmol/L (ref 96–106)
Creatinine, Ser: 1.23 mg/dL (ref 0.76–1.27)
Globulin, Total: 3.3 g/dL (ref 1.5–4.5)
Glucose: 90 mg/dL (ref 70–99)
Potassium: 4.7 mmol/L (ref 3.5–5.2)
Sodium: 142 mmol/L (ref 134–144)
Total Protein: 7.1 g/dL (ref 6.0–8.5)
eGFR: 69 mL/min/{1.73_m2} (ref >59)

## 2023-11-07 ENCOUNTER — Ambulatory Visit: Payer: Commercial Managed Care - HMO | Admitting: Internal Medicine

## 2023-11-07 ENCOUNTER — Encounter: Payer: Self-pay | Admitting: Internal Medicine

## 2023-11-07 VITALS — BP 118/68 | HR 81 | Temp 98.0°F | Resp 18 | Ht 69.0 in | Wt 297.5 lb

## 2023-11-07 DIAGNOSIS — I10 Essential (primary) hypertension: Secondary | ICD-10-CM

## 2023-11-07 DIAGNOSIS — I2510 Atherosclerotic heart disease of native coronary artery without angina pectoris: Secondary | ICD-10-CM | POA: Diagnosis not present

## 2023-11-07 DIAGNOSIS — E785 Hyperlipidemia, unspecified: Secondary | ICD-10-CM

## 2023-11-07 DIAGNOSIS — I2583 Coronary atherosclerosis due to lipid rich plaque: Secondary | ICD-10-CM

## 2023-11-07 DIAGNOSIS — S8012XD Contusion of left lower leg, subsequent encounter: Secondary | ICD-10-CM

## 2023-11-07 DIAGNOSIS — T148XXA Other injury of unspecified body region, initial encounter: Secondary | ICD-10-CM

## 2023-11-07 MED ORDER — OXYCODONE HCL 5 MG PO TABS: 5.0000 mg | ORAL_TABLET | Freq: Four times a day (QID) | ORAL | 0 refills | Status: DC | PRN

## 2023-11-07 NOTE — Progress Notes (Unsigned)
Subjective:    Patient ID: Brian Gross., male    DOB: Feb 16, 1966, 58 y.o.   MRN: [id number]  DOS:  11/07/2023 Type of visit - description: Follow-up  Here with his wife. Since the last visit has seen Ortho regards to the leg injury.  Slowly improving. Ambulatory BPs range from 110-120. History of CAD, denies chest pain or difficulty breathing.   Review of Systems See above   Past Medical History:  Diagnosis Date   Asthma    Borderline diabetes    CAD (coronary artery disease)    a. s/p anterior STEMI in 09/2011 with aspiration of occluded LAD with DES placed at that time, CTO of RCA noted. EF at 30%   Chest pain    CHF (congestive heart failure) (HCC)    Gout    Granulomatosis    chronic granulomatosis (dx at age 72 @ [facility])   Ischemic cardiomyopathy    a. EF at 30% by cath in 09/2011. Has refused echos in the past.    MI (myocardial infarction) (HCC)    Obesity    SOB (shortness of breath)     Past Surgical History:  Procedure Laterality Date   CHOLECYSTECTOMY     LEFT HEART CATHETERIZATION WITH CORONARY ANGIOGRAM N/A 10/18/2011   Procedure: LEFT HEART CATHETERIZATION WITH CORONARY ANGIOGRAM;  Surgeon: Corky Crafts, MD;  Location: [facility] CATH LAB;  Service: Cardiovascular;  Laterality: N/A;   LUNG BIOPSY  1990s   PERCUTANEOUS CORONARY STENT INTERVENTION (PCI-S) N/A 10/18/2011   Procedure: PERCUTANEOUS CORONARY STENT INTERVENTION (PCI-S);  Surgeon: Corky Crafts, MD;  Location: [facility] CATH LAB;  Service: Cardiovascular;  Laterality: N/A;    Current Outpatient Medications  Medication Instructions   acetaminophen (TYLENOL) 500 mg, Every 6 hours PRN   albuterol (VENTOLIN HFA) 108 (90 Base) MCG/ACT inhaler 1-2 puffs, Inhalation, Every 6 hours PRN   aspirin EC 81 mg, Daily   furosemide (LASIX) 20 MG tablet Take one tablet every Sunday, Tuesday and Thursday. 3 times per week.   methocarbamol (ROBAXIN) 500 mg, Oral, Every 6 hours PRN   metoprolol  succinate (TOPROL XL) 25 mg, Oral, Daily   nitroGLYCERIN (NITROSTAT) 0.4 mg, Sublingual, Every 5 min PRN   oxyCODONE (OXY IR/ROXICODONE) 5 mg, Oral, Every 6 hours PRN   rosuvastatin (CRESTOR) 20 mg, Oral, Daily       Objective:   Physical Exam BP 118/68   Pulse 81   Temp 98 F (36.7 C) (Oral)   Resp 18   Ht 5\' 9"  (1.753 m)   Wt 297 lb 8 oz (134.9 kg)   SpO2 97%   BMI 43.93 kg/m  General:   Well developed, NAD, BMI noted. HEENT:  Normocephalic . Face symmetric, atraumatic Lungs:  CTA B Normal respiratory effort, no intercostal retractions, no accessory muscle use. Heart: RRR,  no murmur.  Lower extremities:   R leg is better, smaller in size, previously seen blister at the right foot is gone. Skin: Not pale. Not jaundice Neurologic:  alert & oriented X3.  Speech normal, gait appropriate for age and unassisted Psych--  Cognition and judgment appear intact.  Cooperative with normal attention span and concentration.  Behavior appropriate. No anxious or depressed appearing.      Assessment    Problem list Hyperglycemia H/o Asthma CV: -CAD, MI 2012 -CHF, ischemic cardiomyopathy Gout H/o lung bx (1990s) Morbid obesity   PLAN: Injury, R leg, large hematoma: Since LOV, saw ortho, was recommended conservative treatment and  ace wrap the legs before bedtime.  Gradually improving. Pain control on Tylenol, also takes oxycodone and request a refill.  He is very aware of the risk of addiction and he is willing to take it.  PDMP okay, Rx sent. He is not taking muscle relaxants. CAD, ischemic cardiomyopathy: Saw cardiology last week, ambulatory BPs averaging 110/70 but sometimes in the ~ 100s.  Was rec to  -continue holding losartan  - increase metoprolol to 25 mg nightly.  -Lasix 3 times per week Recommend to continue checking BPs.  High cholesterol: Last LDL 65.Marland Kitchen  No change OSA?  Screen in the hospital came back positive, Epworth scale today 0. Vaccine advised: Has not  changed his mind, declines all vaccines. RTC CPX 3 to 4 months

## 2023-11-07 NOTE — Patient Instructions (Addendum)
  Continue checking your blood pressure regularly Blood pressure goal:  between 110/65 and  135/85. If it is consistently higher or lower, let me know   Next visit with me in 3 to 4 months for a physical exam please schedule it at the front desk

## 2023-11-08 ENCOUNTER — Telehealth: Payer: Self-pay

## 2023-11-08 NOTE — Telephone Encounter (Signed)
Called patient advised of below they verbalized understanding.
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
## 2023-11-08 NOTE — Telephone Encounter (Signed)
-----   Message from [facility] sent at 11/05/2023  8:52 PM EST ----- Please let Brian Gross know that his CBC shows no evidence of anemia or infection, his hemoglobin is improving. His BMET shows normal kidney function, liver function and electrolytes. His lipid profile shows that his LDL or bad cholesterol is 65. Good results! Continue current medications and follow up as planned.

## 2023-11-10 ENCOUNTER — Other Ambulatory Visit: Payer: Self-pay | Admitting: Cardiology

## 2023-12-14 ENCOUNTER — Other Ambulatory Visit: Payer: Self-pay | Admitting: Internal Medicine

## 2023-12-14 ENCOUNTER — Encounter: Payer: Self-pay | Admitting: Internal Medicine

## 2023-12-14 ENCOUNTER — Telehealth: Payer: Self-pay | Admitting: Internal Medicine

## 2023-12-14 MED ORDER — OXYCODONE HCL 5 MG PO TABS: 5.0000 mg | ORAL_TABLET | Freq: Every day | ORAL | 0 refills | Status: AC | PRN

## 2023-12-14 NOTE — Telephone Encounter (Signed)
 Requesting: Oxycodone 5 mg Contract: N/A UDS: N/A Last Visit: 11/07/2023 Next Visit: 02/07/2024 Last Refill: 11/07/2023  Please Advise

## 2023-12-14 NOTE — Telephone Encounter (Signed)
 Requesting: Oxycodone 5 mg Contract: No UDS: None Last Visit: 11/07/2023 Next Visit: 02/07/2024 Last Refill: 11/07/2023  Please Advise

## 2023-12-14 NOTE — Telephone Encounter (Signed)
 Prescription sent.  Will send the patient the following message. Brian Gross. I refilled your oxycodone,  sent 30 tablets, take no more than 1 a day. Oxycodone carries the risk of addiction.   For that reason, if you continue to requiring pain medication I would recommend you to come back in 3 to 4 weeks or I could refer you to a pain management doctor. Respectfully Porfirio Oar, MD

## 2023-12-14 NOTE — Telephone Encounter (Signed)
 Sent!

## 2023-12-14 NOTE — Telephone Encounter (Signed)
 Copied from CRM [id number]. Topic: Clinical - Medication Refill >> Dec 14, 2023 10:10 AM Isabell A wrote: Most Recent Primary Care Visit:  Provider: Willow Ora E  Department: [facility]  Visit Type: OFFICE VISIT  Date: 11/07/2023  Medication: oxyCODONE (OXY IR/ROXICODONE) 5 MG immediate release tablet   Has the patient contacted their pharmacy? Yes (Agent: If no, request that the patient contact the pharmacy for the refill. If patient does not wish to contact the pharmacy document the reason why and proceed with request.) (Agent: If yes, when and what did the pharmacy advise?)  Is this the correct pharmacy for this prescription? Yes If no, delete pharmacy and type the correct one.  This is the patient's preferred pharmacy:  [facility] [phone] [place], [place] - [address] [place] [address] Phone: [phone] Fax: [phone]   Has the prescription been filled recently? Yes  Is the patient out of the medication? No  Has the patient been seen for an appointment in the last year OR does the patient have an upcoming appointment? Yes  Can we respond through MyChart? No  Agent: Please be advised that Rx refills may take up to 3 business days. We ask that you follow-up with your pharmacy.
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
## 2023-12-29 ENCOUNTER — Ambulatory Visit: Payer: Commercial Managed Care - HMO | Attending: Cardiology | Admitting: Cardiology

## 2023-12-29 ENCOUNTER — Encounter: Payer: Self-pay | Admitting: Cardiology

## 2023-12-29 VITALS — BP 110/62 | HR 87 | Ht 69.0 in | Wt 296.8 lb

## 2023-12-29 DIAGNOSIS — I10 Essential (primary) hypertension: Secondary | ICD-10-CM | POA: Diagnosis not present

## 2023-12-29 DIAGNOSIS — I2510 Atherosclerotic heart disease of native coronary artery without angina pectoris: Secondary | ICD-10-CM | POA: Diagnosis not present

## 2023-12-29 DIAGNOSIS — I255 Ischemic cardiomyopathy: Secondary | ICD-10-CM | POA: Diagnosis not present

## 2023-12-29 DIAGNOSIS — E785 Hyperlipidemia, unspecified: Secondary | ICD-10-CM | POA: Diagnosis not present

## 2023-12-29 DIAGNOSIS — R2243 Localized swelling, mass and lump, lower limb, bilateral: Secondary | ICD-10-CM

## 2023-12-29 MED ORDER — LOSARTAN POTASSIUM 25 MG PO TABS: 12.5000 mg | ORAL_TABLET | Freq: Every day | ORAL | 3 refills | Status: DC

## 2023-12-29 NOTE — Patient Instructions (Signed)
 Medication Instructions:  Start Losartan 12.5 mg once a day *If you need a refill on your cardiac medications before your next appointment, please call your pharmacy*  Lab Work: In 2 weeks we are going to need to draw a bmet If you have labs (blood work) drawn today and your tests are completely normal, you will receive your results only by: MyChart Message (if you have MyChart) OR A paper copy in the mail If you have any lab test that is abnormal or we need to change your treatment, we will call you to review the results.  Testing/Procedures: No testing  Follow-Up: At [facility], you and your health needs are our priority.  As part of our continuing mission to provide you with exceptional heart care, we have created designated Provider Care Teams.  These Care Teams include your primary Cardiologist (physician) and Advanced Practice Providers (APPs -  Physician Assistants and Nurse Practitioners) who all work together to provide you with the care you need, when you need it.  We recommend signing up for the patient portal called "MyChart".  Sign up information is provided on this After Visit Summary.  MyChart is used to connect with patients for Virtual Visits (Telemedicine).  Patients are able to view lab/test results, encounter notes, upcoming appointments, etc.  Non-urgent messages can be sent to your provider as well.   To learn more about what you can do with MyChart, go to [email or web address].    Your next appointment:   2 month(s)  Provider:   Reather Littler, NP  Other Instructions

## 2024-01-26 ENCOUNTER — Other Ambulatory Visit: Payer: Self-pay | Admitting: Cardiology

## 2024-02-06 ENCOUNTER — Encounter: Payer: Self-pay | Admitting: Internal Medicine

## 2024-02-07 ENCOUNTER — Encounter: Payer: Commercial Managed Care - HMO | Admitting: Internal Medicine

## 2024-02-21 ENCOUNTER — Telehealth: Payer: Self-pay | Admitting: Cardiology

## 2024-02-21 MED ORDER — FUROSEMIDE 20 MG PO TABS: 20.0000 mg | ORAL_TABLET | ORAL | 3 refills | Status: DC

## 2024-02-21 NOTE — Telephone Encounter (Signed)
*  STAT* If patient is at the pharmacy, call can be transferred to refill team.   1. Which medications need to be refilled? (please list name of each medication and dose if known)   furosemide  (LASIX ) 20 MG tablet    2. Which pharmacy/location (including street and city if local pharmacy) is medication to be sent to?  [id number] - [place], [place] - [address]    3. Do they need a 30 day or 90 day supply? 90

## 2024-02-21 NOTE — Telephone Encounter (Signed)
 RX sent to requested Pharmacy
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
## 2024-03-06 ENCOUNTER — Encounter: Payer: Self-pay | Admitting: Cardiology

## 2024-03-06 ENCOUNTER — Ambulatory Visit: Attending: Cardiology | Admitting: Cardiology

## 2024-03-06 VITALS — BP 126/72 | HR 77 | Ht 69.0 in | Wt 298.2 lb

## 2024-03-06 DIAGNOSIS — I2510 Atherosclerotic heart disease of native coronary artery without angina pectoris: Secondary | ICD-10-CM | POA: Diagnosis not present

## 2024-03-06 DIAGNOSIS — I10 Essential (primary) hypertension: Secondary | ICD-10-CM

## 2024-03-06 DIAGNOSIS — I255 Ischemic cardiomyopathy: Secondary | ICD-10-CM

## 2024-03-06 DIAGNOSIS — E785 Hyperlipidemia, unspecified: Secondary | ICD-10-CM

## 2024-03-06 LAB — BASIC METABOLIC PANEL WITH GFR
BUN/Creatinine Ratio: 21 — ABNORMAL HIGH (ref 9–20)
BUN: 24 mg/dL (ref 6–24)
CO2: 21 mmol/L (ref 20–29)
Calcium: 9.8 mg/dL (ref 8.7–10.2)
Chloride: 98 mmol/L (ref 96–106)
Creatinine, Ser: 1.15 mg/dL (ref 0.76–1.27)
Glucose: 92 mg/dL (ref 70–99)
Potassium: 4.9 mmol/L (ref 3.5–5.2)
Sodium: 139 mmol/L (ref 134–144)
eGFR: 75 mL/min/{1.73_m2} (ref >59)

## 2024-03-06 MED ORDER — FUROSEMIDE 20 MG PO TABS: 20.0000 mg | ORAL_TABLET | Freq: Every day | ORAL | 3 refills | Status: AC

## 2024-03-06 MED ORDER — ROSUVASTATIN CALCIUM 20 MG PO TABS: 20.0000 mg | ORAL_TABLET | Freq: Every day | ORAL | 3 refills | Status: AC

## 2024-03-06 MED ORDER — METOPROLOL SUCCINATE ER 25 MG PO TB24: 25.0000 mg | ORAL_TABLET | Freq: Every day | ORAL | 3 refills | Status: AC

## 2024-03-06 MED ORDER — LOSARTAN POTASSIUM 25 MG PO TABS: 25.0000 mg | ORAL_TABLET | Freq: Every day | ORAL | 3 refills | Status: AC

## 2024-03-06 NOTE — Patient Instructions (Signed)
 Medication Instructions:  Take Losartan  25 mg once a day  Take Lasix  20 mg once a day *If you need a refill on your cardiac medications before your next appointment, please call your pharmacy*  Lab Work: Today we are going to day a Bmet If you have labs (blood work) drawn today and your tests are completely normal, you will receive your results only by: MyChart Message (if you have MyChart) OR A paper copy in the mail If you have any lab test that is abnormal or we need to change your treatment, we will call you to review the results.  Testing/Procedures: No testing  Follow-Up: At [facility], you and your health needs are our priority.  As part of our continuing mission to provide you with exceptional heart care, our providers are all part of one team.  This team includes your primary Cardiologist (physician) and Advanced Practice Providers or APPs (Physician Assistants and Nurse Practitioners) who all work together to provide you with the care you need, when you need it.  Your next appointment:   6 month(s)  Provider:   Eilleen Grates, MD    We recommend signing up for the patient portal called "MyChart".  Sign up information is provided on this After Visit Summary.  MyChart is used to connect with patients for Virtual Visits (Telemedicine).  Patients are able to view lab/test results, encounter notes, upcoming appointments, etc.  Non-urgent messages can be sent to your provider as well.   To learn more about what you can do with MyChart, go to [email or web address].

## 2024-03-07 ENCOUNTER — Ambulatory Visit: Payer: Self-pay | Admitting: Cardiology

## 2024-03-07 NOTE — Telephone Encounter (Signed)
-----   Message from Ellie Gutta [place] sent at 03/07/2024  9:45 AM EDT ----- Please let Brian Gross know that his kidney function and electrolytes are normal. Good results! Continue current medications and follow up as planned.

## 2024-03-07 NOTE — Telephone Encounter (Signed)
 Called patient advised of below they verbalized understanding No questions at this time.

## 2024-04-12 ENCOUNTER — Other Ambulatory Visit: Payer: Self-pay | Admitting: Cardiology

## 2024-05-07 ENCOUNTER — Other Ambulatory Visit: Payer: Self-pay

## 2024-07-11 ENCOUNTER — Other Ambulatory Visit: Payer: Self-pay | Admitting: Cardiology
